# Patient Record
Sex: Female | Born: 1990 | Race: Black or African American | Hispanic: No | Marital: Single | Smoking: Former smoker
Health system: Southern US, Community
[De-identification: ages and names within clinical notes are randomized; demographics above are authoritative.]

## PROBLEM LIST (undated history)

## (undated) DIAGNOSIS — B9689 Other specified bacterial agents as the cause of diseases classified elsewhere: Secondary | ICD-10-CM

## (undated) DIAGNOSIS — I10 Essential (primary) hypertension: Secondary | ICD-10-CM

## (undated) DIAGNOSIS — N760 Acute vaginitis: Secondary | ICD-10-CM

## (undated) DIAGNOSIS — N809 Endometriosis, unspecified: Secondary | ICD-10-CM

---
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
## 2009-10-03 ENCOUNTER — Ambulatory Visit (HOSPITAL_COMMUNITY): Admission: RE | Admit: 2009-10-03 | Discharge: 2009-10-03 | Payer: Self-pay | Admitting: Obstetrics & Gynecology

## 2009-10-21 ENCOUNTER — Encounter: Payer: Self-pay | Admitting: Obstetrics

## 2009-10-21 ENCOUNTER — Inpatient Hospital Stay (HOSPITAL_COMMUNITY): Admission: AD | Admit: 2009-10-21 | Discharge: 2009-10-24 | Payer: Self-pay | Admitting: Obstetrics & Gynecology

## 2011-03-24 LAB — CBC
HCT: 35 % — ABNORMAL LOW (ref 36.0–46.0)
Hemoglobin: 12 g/dL (ref 12.0–15.0)
Hemoglobin: 9.7 g/dL — ABNORMAL LOW (ref 12.0–15.0)
MCHC: 34.4 g/dL (ref 30.0–36.0)
MCHC: 34.7 g/dL (ref 30.0–36.0)
MCV: 93.5 fL (ref 78.0–100.0)
Platelets: 147 10*3/uL — ABNORMAL LOW (ref 150–400)
RBC: 3.78 MIL/uL — ABNORMAL LOW (ref 3.87–5.11)
RDW: 12.6 % (ref 11.5–15.5)

## 2011-03-24 LAB — RPR: RPR Ser Ql: NONREACTIVE

## 2011-09-08 ENCOUNTER — Emergency Department (HOSPITAL_COMMUNITY)
Admission: EM | Admit: 2011-09-08 | Discharge: 2011-09-08 | Disposition: A | Discharge status: Home / Self Care | Payer: Medicaid Other | Attending: Emergency Medicine | Admitting: Emergency Medicine

## 2011-09-08 DIAGNOSIS — R51 Headache: Secondary | ICD-10-CM | POA: Insufficient documentation

## 2011-09-08 DIAGNOSIS — J020 Streptococcal pharyngitis: Secondary | ICD-10-CM | POA: Insufficient documentation

## 2011-11-21 ENCOUNTER — Emergency Department (HOSPITAL_COMMUNITY)
Admission: EM | Admit: 2011-11-21 | Discharge: 2011-11-21 | Disposition: A | Discharge status: Home / Self Care | Payer: Medicaid Other | Source: Home / Self Care | Attending: Family Medicine | Admitting: Family Medicine

## 2011-11-21 DIAGNOSIS — K439 Ventral hernia without obstruction or gangrene: Secondary | ICD-10-CM

## 2011-11-21 NOTE — ED Provider Notes (Signed)
History     CSN: [id number] Arrival date & time: 11/21/2011  6:03 PM   First MD Initiated Contact with Patient 11/21/11 1703      Chief Complaint  Patient presents with  . Abdominal Pain    Pt has small, painful lesion on RLQ that she decribes as "coming and going" and can feel when standing    (Consider location/radiation/quality/duration/timing/severity/associated sxs/prior treatment) HPI Comments: Amy Snyder presents for evaluation of a painful "bump or knot" on her RIGHT lower abdomen that she states comes and goes over the last 3 weeks. She first noticed it 3 weeks ago after doing some abdominal crunches. It then went away, but she also had not been doing crunches. She resumed her abdominal workout 2 days ago and noticed the return of the knot. She denies any pain with bowel movements. She denies any urinary symptoms. She is tolerating PO well without nausea or vomiting.   Patient is a 20 y.o. female presenting with abdominal pain. The history is provided by the patient.  Abdominal Pain The primary symptoms of the illness include abdominal pain. The primary symptoms of the illness do not include nausea, vomiting or diarrhea. The current episode started yesterday. The problem has not changed since onset. The patient states that she believes she is currently not pregnant. The patient has not had a change in bowel habit. Symptoms associated with the illness do not include constipation.    History reviewed. No pertinent past medical history.  Past Surgical History  Procedure Date  . Cesarean section     History reviewed. No pertinent family history.  History  Substance Use Topics  . Smoking status: Current Everyday Smoker -- 0.5 packs/day    Types: Cigarettes  . Smokeless tobacco: Not on file  . Alcohol Use: Yes    OB History    Grav Para Term Preterm Abortions TAB SAB Ect Mult Living                  Review of Systems  Constitutional: Negative.   HENT: Negative.     Eyes: Negative.   Respiratory: Negative.   Cardiovascular: Negative.   Gastrointestinal: Positive for abdominal pain. Negative for nausea, vomiting, diarrhea and constipation.  Genitourinary: Negative.   Musculoskeletal: Negative.   Skin: Negative.   Neurological: Negative.     Allergies  Review of patient's allergies indicates no known allergies.  Home Medications  No current outpatient prescriptions on file.  BP 141/94  Pulse 64  Temp(Src) 99.3 F (37.4 C) (Oral)  Resp 17  SpO2 100%  LMP 11/17/2011  Physical Exam  Nursing note and vitals reviewed. Constitutional: She is oriented to person, place, and time. She appears well-developed and well-nourished.  HENT:  Head: Normocephalic and atraumatic.  Eyes: EOM are normal.  Neck: Normal range of motion.  Pulmonary/Chest: Effort normal.  Abdominal: Soft. Normal appearance and bowel sounds are normal. She exhibits mass. She exhibits no pulsatile midline mass. There is tenderness in the right lower quadrant. There is no rebound, no guarding and no tenderness at McBurney's point.    Musculoskeletal: Normal range of motion.  Neurological: She is alert and oriented to person, place, and time.  Skin: Skin is warm and dry.  Psychiatric: Her behavior is normal.    ED Course  Procedures (including critical care time)  Labs Reviewed - No data to display No results found.   1. Hernia of abdominal wall       MDM  Referred for abdominal imaging to  evaluate for abdominal hernia        Richardo Priest, MD 11/25/11 1635

## 2011-12-02 ENCOUNTER — Ambulatory Visit (INDEPENDENT_AMBULATORY_CARE_PROVIDER_SITE_OTHER): Payer: Medicaid Other | Admitting: Surgery

## 2011-12-02 ENCOUNTER — Encounter (INDEPENDENT_AMBULATORY_CARE_PROVIDER_SITE_OTHER): Payer: Self-pay | Admitting: Surgery

## 2011-12-02 DIAGNOSIS — R1031 Right lower quadrant pain: Secondary | ICD-10-CM

## 2011-12-02 NOTE — Progress Notes (Signed)
Subjective:     [name], female   DOB: [birth year], 20 y.o.   MRN: [id number]  HPI  Amy Snyder  1991-05-27 [id number]  Patient Care Team: Richardo Priest, MD as Referring Physician (Family Medicine) Roseanna Rainbow, MD as Referring Physician (Obstetrics and Gynecology)  This patient is a 20 y.o.female who presents today for surgical evaluation at the request of Dr. Tamela Oddi.   Reason for visit: Right lower quadrant abdominal and groin pain. Question of hernia.  Patient is a pleasant active female who had a C-section 2 years ago. She recovered from that well. No wound infections or problems. Patient is rather physically active. She noticed after doing some sit-ups she started having some pull in her right rectus muscle and down to her groin. She thought she felt a lump. The pain was rather intense. She saw her gynecologist. And there was concern of a possible hernia, therefore, she was sent to me for evaluation.  The patient notes that the soreness is going down. She can feel a lump. However it is not getting bigger, maybe smaller. It does not change with activity or cough. No nausea or vomiting. No fevers chills or sweats. No diarrhea. No falls. No trauma. No sick contacts. No travel history. She's never had anything like this before.  Patient Active Problem List  Diagnoses  . Abdominal pain, RLQ (right lower quadrant), prob muscle strain    History reviewed. No pertinent past medical history.  Past Surgical History  Procedure Date  . Cesarean section Oct 21, 2009    History   Social History  . Marital Status: Single    Spouse Name: N/A    Number of Children: N/A  . Years of Education: N/A   Occupational History  . Not on file.   Social History Main Topics  . Smoking status: Current Everyday Smoker    Types: Cigarettes  . Smokeless tobacco: Not on file   Comment: smokes socially  . Alcohol Use: Yes  . Drug Use: No  . Sexually Active: Yes   Birth Control/ Protection: Injection   Other Topics Concern  . Not on file   Social History Narrative  . No narrative on file    History reviewed. No pertinent family history.  No current outpatient prescriptions on file.  No Known Allergies  BP 128/88  Pulse 68  Temp(Src) 97.8 F (36.6 C) (Temporal)  Resp 16  Ht 5\' 7"  (1.702 m)  Wt 146 lb (66.225 kg)  BMI 22.87 kg/m2  LMP 11/17/2011     Review of Systems  Constitutional: Negative for fever, chills, diaphoresis, appetite change and fatigue.  HENT: Negative for ear pain, sore throat, trouble swallowing, neck pain and ear discharge.   Eyes: Negative for photophobia, discharge and visual disturbance.  Respiratory: Negative for cough, choking, chest tightness and shortness of breath.   Cardiovascular: Negative for chest pain and palpitations.  Gastrointestinal: Positive for abdominal distention. Negative for nausea, vomiting, abdominal pain, diarrhea, constipation, blood in stool, anal bleeding and rectal pain.  Genitourinary: Negative for dysuria, urgency, frequency, decreased urine volume, vaginal bleeding, vaginal discharge and difficulty urinating.  Musculoskeletal: Negative for myalgias and gait problem.  Skin: Negative for color change, pallor, rash and wound.  Neurological: Negative for dizziness, speech difficulty, weakness and numbness.  Hematological: Negative for adenopathy.  Psychiatric/Behavioral: Negative for confusion and agitation. The patient is not nervous/anxious.        Objective:   Physical Exam  Constitutional: She is oriented to  person, place, and time. She appears well-developed and well-nourished. No distress.  HENT:  Head: Normocephalic.  Mouth/Throat: Oropharynx is clear and moist. No oropharyngeal exudate.  Eyes: Conjunctivae and EOM are normal. Pupils are equal, round, and reactive to light. No scleral icterus.  Neck: Normal range of motion. Neck supple. No tracheal deviation present.    Cardiovascular: Normal rate, regular rhythm and intact distal pulses.   Pulmonary/Chest: Effort normal and breath sounds normal. No respiratory distress. She has no wheezes. She has no rales. She exhibits no tenderness.  Abdominal: Soft. She exhibits no distension and no mass. There is no rebound and no guarding. Hernia confirmed negative in the right inguinal area and confirmed negative in the left inguinal area.       RLQ parmedian TTP / nodularity mild  RLQ Suprainguinal nodularity ? Enlarged groin LN?  Not c/w hernia by location.  No change w Valsalva  Genitourinary: No vaginal discharge found.  Musculoskeletal: Normal range of motion. She exhibits no edema and no tenderness.  Lymphadenopathy:    She has no cervical adenopathy.       Right: No inguinal adenopathy present.       Left: No inguinal adenopathy present.  Neurological: She is alert and oriented to person, place, and time. No cranial nerve deficit. She exhibits normal muscle tone. Coordination normal.  Skin: Skin is warm and dry. No rash noted. She is not diaphoretic. No erythema.  Psychiatric: She has a normal mood and affect. Her behavior is normal. Judgment and thought content normal.       Assessment:     Tenderness/nodularity most c/w hematoma / strain.  Doubt hernia at this point given less pain/symptoms    Plan:     At this point, I would recommend an anti-inflammatory regimen including Aleve 2 twice a day, heat 6 times a day, decreased physical activity.  I suspect her nodularity and soreness will gradually fade away over the next few weeks/months.  It is a hernia it will only worsen. If that is the case, then consider diagnostic laparoscopy with possible ventral hernia repair:  The anatomy & physiology of the abdominal wall was discussed.  The pathophysiology of hernias was discussed.  Natural history risks without surgery of enlargement, pain, incarceration & strangulation was discussed.   Contributors to  complications such as smoking, obesity, diabetes, prior surgery, etc were discussed.  I feel the risks of no intervention will lead to serious problems that outweigh the operative risks; therefore, I recommended surgery to reduce and repair the hernia.  I explained laparoscopic techniques with possible need for an open approach.  I noted the probable use of mesh to patch and/or buttress hernia repair  Risks such as bleeding, infection, abscess, need for further treatment, heart attack, death, and other risks were discussed.  I noted a good likelihood this will help address the problem. Goals of post-operative recovery were discussed as well.  Possibility that this will not correct all symptoms was explained.  I stressed the importance of low-impact activity, aggressive pain control, avoiding constipation, & not pushing through pain to minimize risk of post-operative chronic pain or injury. Possibility of reherniation was discussed.  We will work to minimize complications.   An educational handout further explaining the pathology & treatment options was given as well.  Questions were answered.  The patient expresses understanding.  Another thing to consider is a CT scan of the abdomen and pelvis to rule out some other etiology. However, I do not  think a completely normal scan would rule out a subtle hernia that can only be seen with diagnostic laparoscopy.  She will use the recommendations of anti-inflammatories first and call me if anything does not improve or if she worsens

## 2011-12-02 NOTE — Patient Instructions (Signed)
Muscle Strain A muscle strain, or pulled muscle, occurs when a muscle is over-stretched. A small number of muscle fibers may also be torn. This is especially common in athletes. This happens when a sudden violent force placed on a muscle pushes it past its capacity. Usually, recovery from a pulled muscle takes 1 to 2 weeks. But complete healing will take 5 to 6 weeks. There are millions of muscle fibers. Following injury, your body will usually return to normal quickly. HOME CARE INSTRUCTIONS   While awake, apply ice to the sore muscle for 15 to 20 minutes each hour for the first 2 days. Put ice in a plastic bag and place a towel between the bag of ice and your skin.   Do not use the pulled muscle for several days. Do not use the muscle if you have pain.   You may wrap the injured area with an elastic bandage for comfort. Be careful not to bind it too tightly. This may interfere with blood circulation.   Only take over-the-counter or prescription medicines for pain, discomfort, or fever as directed by your caregiver. Do not use aspirin as this will increase bleeding (bruising) at injury site.   Warming up before exercise helps prevent muscle strains.  SEEK MEDICAL CARE IF:  There is increased pain or swelling in the affected area. MAKE SURE YOU:   Understand these instructions.   Will watch your condition.   Will get help right away if you are not doing well or get worse.  Document Released: 12/06/2005 Document Revised: 08/18/2011 Document Reviewed: 07/05/2007 [facility] Patient Information 2012 [place], [place].   Managing Pain  Pain after surgery or related to activity is often due to strain/injury to muscle, tendon, nerves and/or incisions.  This pain is usually short-term and will improve in a few months.   Many people find it helpful to do the following things TOGETHER to help speed the process of healing and to get back to regular activity more quickly:  1. Avoid heavy physical  activity a.  no lifting greater than 20 pounds b. Do not "push through" the pain.  Listen to your body and avoid positions and maneuvers than reproduce the pain c. Walking is okay as tolerated, but go slowly and stop when getting sore.  d. Remember: If it hurts to do it, then don't do it! 2. Take Anti-inflammatory medication  a. Take with food/snack around the clock for 1-2 weeks i. This helps the muscle and nerve tissues become less irritable and calm down faster b. Choose ONE of the following over-the-counter medications: i. Naproxen 220mg  tabs (ex. Aleve) 1-2 pills twice a day  ii. Ibuprofen 200mg  tabs (ex. Advil, Motrin) 3-4 pills with every meal and just before bedtime iii. Acetaminophen 500mg  tabs (Tylenol) 1-2 pills with every meal and just before bedtime 3. Use a Heating pad or Ice/Cold Pack a. 4-6 times a day b. May use warm bath/hottub  or showers 4. Try Gentle Massage and/or Stretching  a. at the area of pain many times a day b. stop if you feel pain - do not overdo it  Try these steps together to help you body heal faster and avoid making things get worse.  Doing just one of these things may not be enough.    If you are not getting better after two weeks or are noticing you are getting worse, contact our office for further advice; we may need to re-evaluate you & see what other things we can do  to help.

## 2011-12-30 ENCOUNTER — Emergency Department (HOSPITAL_COMMUNITY)
Admission: EM | Admit: 2011-12-30 | Discharge: 2011-12-30 | Disposition: A | Discharge status: Home / Self Care | Payer: Medicaid Other | Attending: Emergency Medicine | Admitting: Emergency Medicine

## 2011-12-30 ENCOUNTER — Encounter (HOSPITAL_COMMUNITY): Payer: Self-pay

## 2011-12-30 DIAGNOSIS — R05 Cough: Secondary | ICD-10-CM | POA: Insufficient documentation

## 2011-12-30 DIAGNOSIS — R599 Enlarged lymph nodes, unspecified: Secondary | ICD-10-CM | POA: Insufficient documentation

## 2011-12-30 DIAGNOSIS — J069 Acute upper respiratory infection, unspecified: Secondary | ICD-10-CM

## 2011-12-30 DIAGNOSIS — R112 Nausea with vomiting, unspecified: Secondary | ICD-10-CM | POA: Insufficient documentation

## 2011-12-30 DIAGNOSIS — R51 Headache: Secondary | ICD-10-CM | POA: Insufficient documentation

## 2011-12-30 DIAGNOSIS — J3489 Other specified disorders of nose and nasal sinuses: Secondary | ICD-10-CM | POA: Insufficient documentation

## 2011-12-30 DIAGNOSIS — R059 Cough, unspecified: Secondary | ICD-10-CM | POA: Insufficient documentation

## 2011-12-30 DIAGNOSIS — R61 Generalized hyperhidrosis: Secondary | ICD-10-CM | POA: Insufficient documentation

## 2011-12-30 MED ORDER — ALBUTEROL SULFATE HFA 108 (90 BASE) MCG/ACT IN AERS
2.0000 | INHALATION_SPRAY | Freq: Once | RESPIRATORY_TRACT | Status: AC
  Administered 2011-12-30: 2 via RESPIRATORY_TRACT
  Filled 2011-12-30: qty 6.7

## 2011-12-30 MED ORDER — HYDROCOD POLST-CHLORPHEN POLST 10-8 MG/5ML PO LQCR: 5.0000 mL | Freq: Every evening | ORAL | Status: DC | PRN

## 2011-12-30 MED ORDER — ONDANSETRON 4 MG PO TBDP
ORAL_TABLET | ORAL | Status: AC
  Administered 2011-12-30: 8 mg
  Filled 2011-12-30: qty 2

## 2011-12-30 MED ORDER — IBUPROFEN 200 MG PO TABS
ORAL_TABLET | ORAL | Status: AC
  Administered 2011-12-30: 400 mg
  Filled 2011-12-30: qty 2

## 2011-12-30 MED ORDER — IBUPROFEN 800 MG PO TABS: 800.0000 mg | ORAL_TABLET | Freq: Three times a day (TID) | ORAL | Status: AC

## 2011-12-30 NOTE — ED Notes (Signed)
Pt states that since yesterday she has been having a headache that feels like pressure in her head beating the left side of her head. She states that her vision is ok but she felt slightly dizzy and her eyes felt heavy at times. She states that she has also been having a deep cough that is causing some chest wall pain. Alert and oriented. vicodin last night and tylenol today with little relief. Alert and oriented.

## 2011-12-30 NOTE — ED Provider Notes (Signed)
History     CSN: [id number]  Arrival date & time 12/30/11  1518   First MD Initiated Contact with Patient 12/30/11 1838      Chief Complaint  Patient presents with  . URI  . Cough    (Consider location/radiation/quality/duration/timing/severity/associated sxs/prior treatment) HPI History provided by pt.   Pt has had non-productive cough, nasal congestion, rhinorrhea, left frontal headache and sweats x 2 days.  Had nausea and single episode of vomiting this am. No known fever and denies sore throat, SOB, abdominal pain, diarrhea, body aches, weakness, fatigue.  Known sick contacts.  Pt smokes cigarettes but is otherwise healthy.   History reviewed. No pertinent past medical history.  Past Surgical History  Procedure Date  . Cesarean section Oct 21, 2009    History reviewed. No pertinent family history.  History  Substance Use Topics  . Smoking status: Current Everyday Smoker    Types: Cigarettes  . Smokeless tobacco: Not on file   Comment: smokes socially  . Alcohol Use: Yes    OB History    Grav Para Term Preterm Abortions TAB SAB Ect Mult Living                  Review of Systems  All other systems reviewed and are negative.    Allergies  Review of patient's allergies indicates no known allergies.  Home Medications   Current Outpatient Rx  Name Route Sig Dispense Refill  . ACETAMINOPHEN 325 MG PO TABS Oral Take 650 mg by mouth every 6 (six) hours as needed. For pain/fever    . HYDROCOD POLST-CPM POLST ER 10-8 MG/5ML PO LQCR Oral Take 5 mLs by mouth at bedtime as needed. 60 mL 0  . IBUPROFEN 800 MG PO TABS Oral Take 1 tablet (800 mg total) by mouth 3 (three) times daily. 12 tablet 0    BP 127/87  Pulse 81  Temp(Src) 99.1 F (37.3 C) (Oral)  Resp 18  SpO2 100%  Physical Exam  Nursing note and vitals reviewed. Constitutional: She is oriented to person, place, and time. She appears well-developed and well-nourished. No distress.  HENT:  Head: No  trismus in the jaw.  Right Ear: Tympanic membrane, external ear and ear canal normal.  Left Ear: Tympanic membrane, external ear and ear canal normal.  Mouth/Throat: Uvula is midline and mucous membranes are normal. No oropharyngeal exudate, posterior oropharyngeal edema or posterior oropharyngeal erythema.  Eyes:       Normal appearance  Neck: Normal range of motion. Neck supple.  Cardiovascular: Normal rate and regular rhythm.   Pulmonary/Chest: Effort normal and breath sounds normal. No respiratory distress.  Abdominal: Soft. Bowel sounds are normal. She exhibits no distension. There is no tenderness.  Lymphadenopathy:    She has cervical adenopathy.  Neurological: She is alert and oriented to person, place, and time.  Skin: Skin is warm and dry. No rash noted.  Psychiatric: She has a normal mood and affect. Her behavior is normal.    ED Course  Procedures (including critical care time)  Labs Reviewed - No data to display No results found.   1. Viral URI       MDM  Healthy 21yo F presents w/ c/o URI sx since yesterday.  Known sick contacts.  No respiratory distress and VS w/in nml limits on exam.  Based on duration of sx and exam, pt likely has a viral URI.  Headache probably secondary to sinus congestion.  Received an albuterol inhaler and d/c'd  home w/ tussionex and ibuprofen.  Recommended sudafed, rest, fluids as well.  She has a PCP to f/u with.        Arie Sabina [place], [place] 12/30/11 1941

## 2012-01-02 NOTE — ED Provider Notes (Signed)
Medical screening examination/treatment/procedure(s) were performed by non-physician practitioner and as supervising physician I was immediately available for consultation/collaboration.    Shelda Jakes, MD 01/02/12 [phone]

## 2012-11-27 ENCOUNTER — Telehealth (INDEPENDENT_AMBULATORY_CARE_PROVIDER_SITE_OTHER): Payer: Self-pay

## 2012-11-27 DIAGNOSIS — R109 Unspecified abdominal pain: Secondary | ICD-10-CM

## 2012-11-27 NOTE — Telephone Encounter (Signed)
Patient called in stating she was seen a year ago and was told if pain came back to call us to be evaluated for a poss hernia. I read office note and it said he may want to schedule a ct but that he is not sure it would show a hernia anyway. I scheduled her for first available long term follow up spot for January 6. Just FYI incase CT needs to be ordered before visit.

## 2012-11-28 NOTE — Telephone Encounter (Signed)
Please obtain CT scan of abd/pelvis with PO/IV contrast to r/o hernia other etiology for her pain

## 2012-11-29 NOTE — Addendum Note (Signed)
Addended byLiliana Cline on: 11/29/2012 09:54 AM   Modules accepted: Orders

## 2012-11-29 NOTE — Telephone Encounter (Signed)
Left message on machine for patient to call back and ask for me.   

## 2012-12-04 ENCOUNTER — Telehealth (INDEPENDENT_AMBULATORY_CARE_PROVIDER_SITE_OTHER): Payer: Self-pay

## 2012-12-04 NOTE — Telephone Encounter (Signed)
Called pt to let her know that I scheduled her CT with GI at [address] on Fri 12/20 at 10:15am

## 2012-12-08 ENCOUNTER — Ambulatory Visit
Admission: RE | Admit: 2012-12-08 | Discharge: 2012-12-08 | Disposition: A | Discharge status: Home / Self Care | Payer: Medicaid Other | Source: Ambulatory Visit | Attending: Surgery | Admitting: Surgery

## 2012-12-08 DIAGNOSIS — R109 Unspecified abdominal pain: Secondary | ICD-10-CM

## 2012-12-08 MED ORDER — IOHEXOL 300 MG/ML  SOLN
100.0000 mL | Freq: Once | INTRAMUSCULAR | Status: AC | PRN
  Administered 2012-12-08: 100 mL via INTRAVENOUS

## 2012-12-15 ENCOUNTER — Telehealth (INDEPENDENT_AMBULATORY_CARE_PROVIDER_SITE_OTHER): Payer: Self-pay | Admitting: General Surgery

## 2012-12-15 NOTE — Telephone Encounter (Signed)
Message copied by Liliana Cline on Fri Dec 15, 2012  9:03 AM ------      Message from: Ardeth Sportsman      Created: Fri Dec 08, 2012  2:08 PM       Tell pt the good news!  No evidence of hernia, bowel obstruction, constipation, mass.  No tumor/lipoma.  No enlarged lymph nodes.  Encourage Aleve 2 BID & Heat QID & <20 lb lifting for 2 weeks & see if the MS-like Sx go away.  If not, consider GI consult/referral

## 2012-12-15 NOTE — Telephone Encounter (Signed)
Patient called and made aware of CT results. She will try aleve/heat/no lifting and if no better she will contact a GI MD. She wanted her appt with DR Gross cancelled.

## 2012-12-25 ENCOUNTER — Ambulatory Visit (INDEPENDENT_AMBULATORY_CARE_PROVIDER_SITE_OTHER): Payer: Self-pay | Admitting: Surgery

## 2013-05-21 ENCOUNTER — Ambulatory Visit (INDEPENDENT_AMBULATORY_CARE_PROVIDER_SITE_OTHER): Payer: Medicaid Other | Admitting: Obstetrics

## 2013-05-21 ENCOUNTER — Encounter: Payer: Self-pay | Admitting: Obstetrics

## 2013-05-21 VITALS — BP 141/90 | HR 73 | Temp 99.1°F | Ht 67.0 in | Wt 156.0 lb

## 2013-05-21 DIAGNOSIS — Z113 Encounter for screening for infections with a predominantly sexual mode of transmission: Secondary | ICD-10-CM

## 2013-05-21 NOTE — Progress Notes (Signed)
Subjective:    Amy Snyder is a 22 y.o. female who presents for sexually transmitted disease check. Sexual history reviewed with the patient. STI Exposure: pt was treated for Chlamydia in December 2013. Pt here today for TOC. Previous history of STI GC, chlamydia, trichomonas. Current symptoms abnormal bleeding, vaginal odor. Contraception: abstinence Menstrual History: OB History   Grav Para Term Preterm Abortions TAB SAB Ect Mult Living                  Menarche age: 52  No LMP recorded.    The following portions of the patient's history were reviewed and updated as appropriate: allergies, current medications, past family history, past medical history, past social history, past surgical history and problem list.  Review of Systems Pertinent items are noted in HPI.    Objective:    BP 141/90  Pulse 73  Temp(Src) 99.1 F (37.3 C)  Ht 5\' 7"  (1.702 m)  Wt 156 lb (70.761 kg)  BMI 24.43 kg/m2 General:   alert and no distress  Lymph Nodes:   Cervical, supraclavicular, and axillary nodes normal. and NT.  Pelvis:  Vulva and vagina appear normal. Bimanual exam reveals normal uterus and adnexa.  Cultures:  GC and Chlamydia genprobes     Assessment:    Possible STD exposure    Plan:    Discussed safe sexual practice in detail Appropriate educational material was distributed See orders for STD cultures and assays Will call pt with results RTC PRN

## 2013-05-22 LAB — GC/CHLAMYDIA PROBE AMP: CT Probe RNA: NEGATIVE

## 2013-05-23 ENCOUNTER — Encounter: Payer: Self-pay | Admitting: Obstetrics

## 2013-06-27 ENCOUNTER — Encounter: Payer: Self-pay | Admitting: Obstetrics

## 2013-07-26 ENCOUNTER — Ambulatory Visit: Payer: Medicaid Other | Admitting: Obstetrics

## 2013-08-02 ENCOUNTER — Encounter: Payer: Self-pay | Admitting: Obstetrics

## 2013-08-02 ENCOUNTER — Ambulatory Visit (INDEPENDENT_AMBULATORY_CARE_PROVIDER_SITE_OTHER): Payer: Medicaid Other | Admitting: Obstetrics

## 2013-08-02 DIAGNOSIS — N920 Excessive and frequent menstruation with regular cycle: Secondary | ICD-10-CM | POA: Insufficient documentation

## 2013-08-02 DIAGNOSIS — Z113 Encounter for screening for infections with a predominantly sexual mode of transmission: Secondary | ICD-10-CM

## 2013-08-02 DIAGNOSIS — N939 Abnormal uterine and vaginal bleeding, unspecified: Secondary | ICD-10-CM | POA: Insufficient documentation

## 2013-08-02 DIAGNOSIS — N898 Other specified noninflammatory disorders of vagina: Secondary | ICD-10-CM

## 2013-08-02 MED ORDER — MEDROXYPROGESTERONE ACETATE 10 MG PO TABS: 10.0000 mg | ORAL_TABLET | Freq: Every day | ORAL | Status: DC

## 2013-08-02 NOTE — Progress Notes (Signed)
Subjective:     Amy Snyder is a 22 y.o. female here for problem visit.  Current complaints: abnormal vaginal bleeding, and "yellowish" vaginal discharge.  Personal health questionnaire reviewed: not asked.   Gynecologic History No LMP recorded. Contraception: abstinence  Obstetric History OB History  Gravida Para Term Preterm AB SAB TAB Ectopic Multiple Living  1 1  1      1     # Outcome Date GA Lbr Len/2nd Weight Sex Delivery Anes PTL Lv  1 PRE 10/21/09 [redacted]w[redacted]d  2 lb 4 oz (1.021 kg) M CS EPI  Y     Comments: [facility]OG       The following portions of the patient's history were reviewed and updated as appropriate: allergies, current medications, past family history, past medical history, past social history, past surgical history and problem list.  Review of Systems Pertinent items are noted in HPI.    Objective:    General appearance: alert and no distress Abdomen: normal findings: soft, non-tender Pelvic: cervix normal in appearance, external genitalia normal, no adnexal masses or tenderness, no cervical motion tenderness, rectovaginal septum normal, uterus normal size, shape, and consistency and vagina with thick white discharge.    Assessment:    AUB. Menorrhagia.   Plan:    Education reviewed: safe sex/STD prevention and AUB off Depo Provera.  Provera x 14 days     Patient does not want any contraception, except condoms.

## 2013-08-03 LAB — RPR

## 2013-08-03 LAB — HIV ANTIBODY (ROUTINE TESTING W REFLEX): HIV: NONREACTIVE

## 2013-08-03 LAB — WET PREP BY MOLECULAR PROBE: Candida species: NEGATIVE

## 2013-08-03 LAB — HEPATITIS C ANTIBODY: HCV Ab: NEGATIVE

## 2013-08-14 ENCOUNTER — Telehealth: Payer: Self-pay | Admitting: *Deleted

## 2013-08-14 NOTE — Telephone Encounter (Signed)
Patient called requesting lab results. LM on VM to CB.

## 2013-08-23 ENCOUNTER — Encounter: Payer: Self-pay | Admitting: Obstetrics

## 2013-10-30 ENCOUNTER — Encounter (HOSPITAL_COMMUNITY): Payer: Self-pay | Admitting: Emergency Medicine

## 2013-10-30 ENCOUNTER — Emergency Department (HOSPITAL_COMMUNITY)
Admission: EM | Admit: 2013-10-30 | Discharge: 2013-10-30 | Discharge status: Against Medical Advice | Payer: Medicaid Other | Attending: Emergency Medicine | Admitting: Emergency Medicine

## 2013-10-30 DIAGNOSIS — R109 Unspecified abdominal pain: Secondary | ICD-10-CM | POA: Insufficient documentation

## 2013-10-30 DIAGNOSIS — I10 Essential (primary) hypertension: Secondary | ICD-10-CM | POA: Insufficient documentation

## 2013-10-30 HISTORY — DX: Essential (primary) hypertension: I10

## 2013-10-30 NOTE — ED Notes (Signed)
Pt sts intermittent "knot" in lower abd x years that has been evaluated for hernia in past for; pt sts noticed again recently and having some pain

## 2013-10-30 NOTE — ED Notes (Signed)
Called x2 for room 

## 2013-10-30 NOTE — ED Notes (Signed)
Pt called x1 for room placement with no answer 

## 2013-10-31 ENCOUNTER — Emergency Department (HOSPITAL_COMMUNITY)
Admission: EM | Admit: 2013-10-31 | Discharge: 2013-10-31 | Discharge status: Against Medical Advice | Payer: Medicaid Other | Attending: Emergency Medicine | Admitting: Emergency Medicine

## 2013-10-31 ENCOUNTER — Encounter (HOSPITAL_COMMUNITY): Payer: Self-pay | Admitting: Emergency Medicine

## 2013-10-31 DIAGNOSIS — Z3202 Encounter for pregnancy test, result negative: Secondary | ICD-10-CM | POA: Insufficient documentation

## 2013-10-31 DIAGNOSIS — F17200 Nicotine dependence, unspecified, uncomplicated: Secondary | ICD-10-CM | POA: Insufficient documentation

## 2013-10-31 DIAGNOSIS — R1031 Right lower quadrant pain: Secondary | ICD-10-CM | POA: Insufficient documentation

## 2013-10-31 DIAGNOSIS — I10 Essential (primary) hypertension: Secondary | ICD-10-CM | POA: Insufficient documentation

## 2013-10-31 LAB — URINALYSIS, ROUTINE W REFLEX MICROSCOPIC
Bilirubin Urine: NEGATIVE
Glucose, UA: NEGATIVE mg/dL
Protein, ur: NEGATIVE mg/dL
Urobilinogen, UA: 1 mg/dL (ref 0.0–1.0)

## 2013-10-31 LAB — CBC WITH DIFFERENTIAL/PLATELET
Basophils Relative: 1 % (ref 0–1)
Eosinophils Absolute: 0.4 10*3/uL (ref 0.0–0.7)
Lymphocytes Relative: 47 % — ABNORMAL HIGH (ref 12–46)
MCH: 31.6 pg (ref 26.0–34.0)
MCHC: 36.2 g/dL — ABNORMAL HIGH (ref 30.0–36.0)
MCV: 87.2 fL (ref 78.0–100.0)
Monocytes Absolute: 0.5 10*3/uL (ref 0.1–1.0)
Neutro Abs: 3.4 10*3/uL (ref 1.7–7.7)
Neutrophils Relative %: 42 % — ABNORMAL LOW (ref 43–77)
Platelets: 188 10*3/uL (ref 150–400)

## 2013-10-31 LAB — URINE MICROSCOPIC-ADD ON

## 2013-10-31 LAB — COMPREHENSIVE METABOLIC PANEL
AST: 20 U/L (ref 0–37)
BUN: 8 mg/dL (ref 6–23)
GFR calc Af Amer: >90 mL/min (ref >90)
Glucose, Bld: 118 mg/dL — ABNORMAL HIGH (ref 70–99)
Sodium: 137 mEq/L (ref 135–145)
Total Bilirubin: 0.3 mg/dL (ref 0.3–1.2)

## 2013-10-31 NOTE — ED Notes (Signed)
No answer, unable to find, not in w/r.

## 2013-10-31 NOTE — ED Notes (Signed)
Pt. reports intermittent RLQ pain for 2 years , denies  nausea /vomitting or diarrhea , no urinary discomfort or vaginal discharge.

## 2013-10-31 NOTE — ED Notes (Signed)
No answer, unable to find, not in w/r.  

## 2013-11-01 LAB — URINE CULTURE: Culture: NO GROWTH

## 2013-11-06 ENCOUNTER — Emergency Department (HOSPITAL_COMMUNITY)
Admission: EM | Admit: 2013-11-06 | Discharge: 2013-11-06 | Disposition: A | Discharge status: Home / Self Care | Payer: Medicaid Other | Attending: Emergency Medicine | Admitting: Emergency Medicine

## 2013-11-06 ENCOUNTER — Encounter (HOSPITAL_COMMUNITY): Payer: Self-pay | Admitting: Emergency Medicine

## 2013-11-06 DIAGNOSIS — R109 Unspecified abdominal pain: Secondary | ICD-10-CM | POA: Insufficient documentation

## 2013-11-06 DIAGNOSIS — K458 Other specified abdominal hernia without obstruction or gangrene: Secondary | ICD-10-CM | POA: Insufficient documentation

## 2013-11-06 DIAGNOSIS — F17200 Nicotine dependence, unspecified, uncomplicated: Secondary | ICD-10-CM | POA: Insufficient documentation

## 2013-11-06 DIAGNOSIS — I10 Essential (primary) hypertension: Secondary | ICD-10-CM | POA: Insufficient documentation

## 2013-11-06 DIAGNOSIS — K439 Ventral hernia without obstruction or gangrene: Secondary | ICD-10-CM

## 2013-11-06 NOTE — ED Notes (Signed)
Pt c/o knot in lower abd off and on x 2 years; painful to touch; no nausea or vomiting

## 2013-11-06 NOTE — ED Provider Notes (Signed)
CSN: [id number]     Arrival date & time 11/06/13  2027 History  This chart was scribed for non-physician practitioner working with Gwyneth Sprout, MD by Ashley Jacobs, ED scribe. This patient was seen in room WTR8/WTR8 and the patient's care was started at 9:54 PM.   First MD Initiated Contact with Patient 11/06/13 2147     Chief Complaint  Patient presents with  . Cyst   (Consider location/radiation/quality/duration/timing/severity/associated sxs/prior Treatment) The history is provided by the patient.   HPI Comments: Kaysee Hergert is a 22 y.o. female who presents to the Emergency Department complaining of a painful, persistent cyst on her lower abdomen for the past two years. The cyst is painful to the touch. She visited her PCP for the same cyst two years ago and visited a Careers adviser (who concluded the cyst is non problematic). Pt denies nausea, vomiting and fever. She does not have any known allergies and a medical hx of HTN.  Pt currently smoke 0.2 packs of cigarettes a day and drinks alcohol socially.  Past Medical History  Diagnosis Date  . Hypertension    Past Surgical History  Procedure Laterality Date  . Cesarean section  Oct 21, 2009   Family History  Problem Relation Age of Onset  . Hypertension Mother   . Diabetes     History  Substance Use Topics  . Smoking status: Current Every Day Smoker -- 0.20 packs/day for 3 years    Types: Cigarettes  . Smokeless tobacco: Never Used     Comment: smokes socially  . Alcohol Use: Yes     Comment: socially   OB History   Grav Para Term Preterm Abortions TAB SAB Ect Mult Living   1 1  1      1      Review of Systems  Gastrointestinal: Positive for abdominal pain.  Skin: Negative for color change, rash and wound.    Allergies  Review of patient's allergies indicates no known allergies.  Home Medications  No current outpatient prescriptions on file. BP 147/92  Pulse 74  Temp(Src) 98.9 F (37.2 C) (Oral)  Resp 14   SpO2 100% Physical Exam  Nursing note and vitals reviewed. Constitutional: She appears well-developed and well-nourished.  Neck: Normal range of motion.  Cardiovascular: Normal rate.   Abdominal: Soft. Bowel sounds are normal. She exhibits no distension. There is no tenderness.    Musculoskeletal: Normal range of motion.  Neurological: She is alert.    ED Course  Procedures (including critical care time) DIAGNOSTIC STUDIES: Oxygen Saturation is 100% on room air, normal by my interpretation.    COORDINATION OF CARE: 9:58 PM Discussed course of care with pt . Pt understands and agrees.  Labs Review Labs Reviewed - No data to display Imaging Review No results found.  EKG Interpretation   None       MDM   1. Hernia of abdominal wall     Patient in no acute distress it took he several minutes and several position to find the nodule    I personally performed the services described in this documentation, which was scribed in my presence. The recorded information has been reviewed and is accurate.  Arman Filter, NP 11/07/13 [phone]

## 2013-11-06 NOTE — ED Notes (Signed)
Pt needs O2 sats and hr.  Unable to obtain due to nails

## 2013-11-06 NOTE — ED Notes (Signed)
Pt has a knot in her lower abdomen that she has had for two years, unsure of what it is. Has had a CT scan in the past to rule out anything acute.

## 2013-11-07 NOTE — ED Provider Notes (Signed)
Medical screening examination/treatment/procedure(s) were performed by non-physician practitioner and as supervising physician I was immediately available for consultation/collaboration.  EKG Interpretation   None         Gwyneth Sprout, MD 11/07/13 1558

## 2013-11-19 ENCOUNTER — Ambulatory Visit (INDEPENDENT_AMBULATORY_CARE_PROVIDER_SITE_OTHER): Payer: Medicaid Other | Admitting: Obstetrics

## 2013-11-19 ENCOUNTER — Encounter: Payer: Self-pay | Admitting: Obstetrics

## 2013-11-19 VITALS — BP 146/95 | HR 73 | Temp 98.6°F | Wt 162.0 lb

## 2013-11-19 DIAGNOSIS — Z3009 Encounter for other general counseling and advice on contraception: Secondary | ICD-10-CM

## 2013-11-19 DIAGNOSIS — Z0000 Encounter for general adult medical examination without abnormal findings: Secondary | ICD-10-CM

## 2013-11-19 DIAGNOSIS — K439 Ventral hernia without obstruction or gangrene: Secondary | ICD-10-CM

## 2013-11-19 NOTE — Progress Notes (Signed)
Subjective:     Amy Snyder is a 22 y.o. female here for a problem visit exam.  Current complaints: abnormal bleeding. Pt states that she has had bleeding approx every 2 weeks since last depo injection.  Last injection was Feb 2014.  Pt is also concerned that she may have a yeast/bacteria infection.  Pt would also like to have her annual pap exam at today's visit if possible.  Pt was also seen at [facility] 2 weeks ago for right side mass.  She was advised that it could be a hernia and that she should f/u with physician Personal health questionnaire reviewed: yes.   Gynecologic History Patient's last menstrual period was 11/13/2013. Contraception: Depo Provera Last Pap: Nov 2013. Results were: abnormal-pt was advised to f/u Last mammogram: n/a. Results were: n/a  Obstetric History OB History  Gravida Para Term Preterm AB SAB TAB Ectopic Multiple Living  1 1  1      1     # Outcome Date GA Lbr Len/2nd Weight Sex Delivery Anes PTL Lv  1 PRE 10/21/09 [redacted]w[redacted]d  2 lb 4 oz (1.021 kg) M CS EPI  Y     Comments: [facility]       The following portions of the patient's history were reviewed and updated as appropriate: allergies, current medications, past family history, past medical history, past social history, past surgical history and problem list.  Review of Systems Pertinent items are noted in HPI.    Objective:    General appearance: alert and no distress Breasts: normal appearance, no masses or tenderness Abdomen: normal findings: soft, non-tender.  ? Right ventral hernia. Pelvic: cervix normal in appearance, external genitalia normal, no adnexal masses or tenderness, no cervical motion tenderness, rectovaginal septum normal, uterus normal size, shape, and consistency and vagina normal without discharge    Assessment:    Healthy female exam.   Probable ventral hernia     Plan:    Education reviewed: safe sex/STD prevention. Contraception: condoms and stopping Depo Provera. Follow up  in: 1 year. Referred to General Surgery.

## 2013-11-19 NOTE — Addendum Note (Signed)
Addended by: Elby Beck F on: 11/19/2013 05:00 PM   Modules accepted: Orders

## 2013-11-20 LAB — PAP IG W/ RFLX HPV ASCU

## 2013-11-20 LAB — WET PREP BY MOLECULAR PROBE
Gardnerella vaginalis: NEGATIVE
Trichomonas vaginosis: NEGATIVE

## 2013-11-20 LAB — GC/CHLAMYDIA PROBE AMP: GC Probe RNA: NEGATIVE

## 2013-11-23 ENCOUNTER — Telehealth: Payer: Self-pay | Admitting: *Deleted

## 2013-11-23 NOTE — Telephone Encounter (Signed)
Pt given lab results from 11/19/13. Pt advised to call office to schedule colposcopy for abnormal pap.

## 2013-11-27 ENCOUNTER — Encounter (INDEPENDENT_AMBULATORY_CARE_PROVIDER_SITE_OTHER): Payer: Self-pay | Admitting: Surgery

## 2013-11-27 ENCOUNTER — Ambulatory Visit (INDEPENDENT_AMBULATORY_CARE_PROVIDER_SITE_OTHER): Payer: Medicaid Other | Admitting: Surgery

## 2013-11-27 VITALS — BP 118/78 | HR 80 | Temp 98.1°F | Resp 16 | Ht 67.0 in | Wt 166.0 lb

## 2013-11-27 DIAGNOSIS — D1739 Benign lipomatous neoplasm of skin and subcutaneous tissue of other sites: Secondary | ICD-10-CM

## 2013-11-27 DIAGNOSIS — D171 Benign lipomatous neoplasm of skin and subcutaneous tissue of trunk: Secondary | ICD-10-CM | POA: Insufficient documentation

## 2013-11-27 NOTE — Progress Notes (Signed)
[name], female   DOB: 01/03/1991, 22 y.o.   MRN: [id number]  Chief Complaint  Patient presents with  . Hernia    HPI Amy Snyder is a 22 y.o. female.  Patient sent at request of Dr. Coral Ceo or right lower quadrant abdominal pain and mass concerning for hernia. Was seen in this practice in 2013 for same problem. CT scan revealed no abnormality at that time. She has mild to moderate discomfort and a lump located in her right lower quadrant abdominal wall region. The areas got larger he thinks over one year. Discomfort when she pushes on the area. No change in bowel or bladder function. It  seems to be less noticeable if she is lying on her back. HPI  History reviewed. No pertinent past medical history.  Past Surgical History  Procedure Laterality Date  . Cesarean section  Oct 21, 2009    Family History  Problem Relation Age of Onset  . Hypertension Mother   . Diabetes      Social History History  Substance Use Topics  . Smoking status: Current Every Day Smoker -- 0.20 packs/day for 3 years    Types: Cigarettes  . Smokeless tobacco: Never Used     Comment: smokes socially  . Alcohol Use: Yes     Comment: socially    No Known Allergies  No current outpatient prescriptions on file.   No current facility-administered medications for this visit.    Review of Systems Review of Systems  Constitutional: Negative for fever, chills and unexpected weight change.  HENT: Negative for congestion, hearing loss, sore throat, trouble swallowing and voice change.   Eyes: Negative for visual disturbance.  Respiratory: Negative for cough and wheezing.   Cardiovascular: Negative for chest pain, palpitations and leg swelling.  Gastrointestinal: Negative for nausea, vomiting, abdominal pain, diarrhea, constipation, blood in stool, abdominal distention and anal bleeding.  Genitourinary: Negative for hematuria, vaginal bleeding and difficulty urinating.    Musculoskeletal: Negative for arthralgias.  Skin: Negative for rash and wound.  Neurological: Negative for seizures, syncope and headaches.  Hematological: Negative for adenopathy. Does not bruise/bleed easily.  Psychiatric/Behavioral: Negative for confusion.    Blood pressure 118/78, pulse 80, temperature 98.1 F (36.7 C), temperature source Temporal, resp. rate 16, height 5\' 7"  (1.702 m), weight 166 lb (75.297 kg), last menstrual period 11/13/2013.  Physical Exam Physical Exam  Constitutional: She is oriented to person, place, and time. She appears well-developed and well-nourished.  HENT:  Head: Normocephalic and atraumatic.  Eyes: Pupils are equal, round, and reactive to light. No scleral icterus.  Neck: Normal range of motion. Neck supple.  Cardiovascular: Normal rate and regular rhythm.   Pulmonary/Chest: Effort normal and breath sounds normal.  Abdominal: Soft. There is no tenderness. No hernia. Hernia confirmed negative in the right inguinal area and confirmed negative in the left inguinal area.    Musculoskeletal: Normal range of motion.  Neurological: She is alert and oriented to person, place, and time.  Skin: Skin is warm and dry.  Psychiatric: She has a normal mood and affect. Her behavior is normal. Judgment and thought content normal.    Data Reviewed  Clinical Data: Abdominal pain, evaluate for hernia  CT ABDOMEN AND PELVIS WITH CONTRAST  Technique: Multidetector CT imaging of the abdomen and pelvis was  performed following the standard protocol during bolus  administration of intravenous contrast.  Contrast: OMNIPAQUE IOHEXOL 300 MG/ML SOLN  Comparison: None.  Findings: The lung bases are  clear. The liver enhances with no  suspicious abnormality and no ductal dilatation is seen. A  probable tiny cyst is noted near the dome of the right lobe  posteriorly and laterally. No calcified gallstones are seen. The  pancreas is normal in size and the pancreatic  duct is not dilated.  The adrenal glands and spleen are unremarkable. The stomach is  moderately fluid distended with no abnormality noted. The kidneys  enhance with no calculus or mass and there is no evidence of  hydronephrosis. The abdominal aorta is normal in caliber.  The uterus is slightly to the left of midline. The urinary bladder  is unremarkable. There are probable ovarian follicles bilaterally.  No free fluid is seen within the pelvis. No abnormality of the  colon is seen. The appendix fills with contrast and air and is  unremarkable, and the terminal ileum appears normal. No bony  abnormality is seen.  No abdominal wall hernia is noted.  IMPRESSION:  1. No abdominal wall hernia.  2. No explanation for the patient's pain is seen.  Original Report Authenticated By: Dwyane Dee, M.D.  Assessment    Right lower quadrant abdominal wall lipoma 3 cm x 3 cm    Plan    Discussed excision of the lipoma with the patient. This was noted on physical exam and can be seen on CT scan from last year even though it is not reported. No evidence of hernia. Patient will think about having this removed he'll let me know. She thinks it's gotten larger.       Katharin Schneider A. 11/27/2013, 10:41 AM

## 2013-11-27 NOTE — Patient Instructions (Signed)

## 2013-11-28 ENCOUNTER — Encounter: Payer: Self-pay | Admitting: Obstetrics

## 2013-12-25 ENCOUNTER — Encounter: Payer: Self-pay | Admitting: *Deleted

## 2013-12-25 ENCOUNTER — Encounter: Payer: Medicaid Other | Admitting: Obstetrics

## 2013-12-31 ENCOUNTER — Encounter: Payer: Medicaid Other | Admitting: Obstetrics

## 2014-01-05 ENCOUNTER — Encounter (HOSPITAL_COMMUNITY): Payer: Self-pay | Admitting: *Deleted

## 2014-01-05 ENCOUNTER — Inpatient Hospital Stay (HOSPITAL_COMMUNITY)
Admission: AD | Admit: 2014-01-05 | Discharge: 2014-01-05 | Disposition: A | Discharge status: Home / Self Care | Payer: Medicaid Other | Source: Ambulatory Visit | Attending: Obstetrics | Admitting: Obstetrics

## 2014-01-05 DIAGNOSIS — B9689 Other specified bacterial agents as the cause of diseases classified elsewhere: Secondary | ICD-10-CM | POA: Insufficient documentation

## 2014-01-05 DIAGNOSIS — L293 Anogenital pruritus, unspecified: Secondary | ICD-10-CM | POA: Insufficient documentation

## 2014-01-05 DIAGNOSIS — I10 Essential (primary) hypertension: Secondary | ICD-10-CM | POA: Insufficient documentation

## 2014-01-05 DIAGNOSIS — N760 Acute vaginitis: Secondary | ICD-10-CM | POA: Insufficient documentation

## 2014-01-05 DIAGNOSIS — A499 Bacterial infection, unspecified: Secondary | ICD-10-CM | POA: Insufficient documentation

## 2014-01-05 LAB — WET PREP, GENITAL
Trich, Wet Prep: NONE SEEN
Yeast Wet Prep HPF POC: NONE SEEN

## 2014-01-05 LAB — POCT PREGNANCY, URINE: PREG TEST UR: NEGATIVE

## 2014-01-05 MED ORDER — METRONIDAZOLE 500 MG PO TABS: 500.0000 mg | ORAL_TABLET | Freq: Two times a day (BID) | ORAL | Status: DC

## 2014-01-05 MED ORDER — HYDROCHLOROTHIAZIDE 25 MG PO TABS: 25.0000 mg | ORAL_TABLET | Freq: Every day | ORAL | Status: DC

## 2014-01-05 NOTE — Discharge Instructions (Signed)
Hypertension As your heart beats, it forces blood through your arteries. This force is your blood pressure. If the pressure is too high, it is called hypertension (HTN) or high blood pressure. HTN is dangerous because you may have it and not know it. High blood pressure may mean that your heart has to work harder to pump blood. Your arteries may be narrow or stiff. The extra work puts you at risk for heart disease, stroke, and other problems.  Blood pressure consists of two numbers, a higher number over a lower, 110/72, for example. It is stated as "110 over 72." The ideal is below 120 for the top number (systolic) and under 80 for the bottom (diastolic). Write down your blood pressure today. You should pay close attention to your blood pressure if you have certain conditions such as:  Heart failure.  Prior heart attack.  Diabetes  Chronic kidney disease.  Prior stroke.  Multiple risk factors for heart disease. To see if you have HTN, your blood pressure should be measured while you are seated with your arm held at the level of the heart. It should be measured at least twice. A one-time elevated blood pressure reading (especially in the Emergency Department) does not mean that you need treatment. There may be conditions in which the blood pressure is different between your right and left arms. It is important to see your caregiver soon for a recheck. Most people have essential hypertension which means that there is not a specific cause. This type of high blood pressure may be lowered by changing lifestyle factors such as:  Stress.  Smoking.  Lack of exercise.  Excessive weight.  Drug/tobacco/alcohol use.  Eating less salt. Most people do not have symptoms from high blood pressure until it has caused damage to the body. Effective treatment can often prevent, delay or reduce that damage. TREATMENT  When a cause has been identified, treatment for high blood pressure is directed at the  cause. There are a large number of medications to treat HTN. These fall into several categories, and your caregiver will help you select the medicines that are best for you. Medications may have side effects. You should review side effects with your caregiver. If your blood pressure stays high after you have made lifestyle changes or started on medicines,   Your medication(s) may need to be changed.  Other problems may need to be addressed.  Be certain you understand your prescriptions, and know how and when to take your medicine.  Be sure to follow up with your caregiver within the time frame advised (usually within two weeks) to have your blood pressure rechecked and to review your medications.  If you are taking more than one medicine to lower your blood pressure, make sure you know how and at what times they should be taken. Taking two medicines at the same time can result in blood pressure that is too low. SEEK IMMEDIATE MEDICAL CARE IF:  You develop a severe headache, blurred or changing vision, or confusion.  You have unusual weakness or numbness, or a faint feeling.  You have severe chest or abdominal pain, vomiting, or breathing problems. MAKE SURE YOU:   Understand these instructions.  Will watch your condition.  Will get help right away if you are not doing well or get worse. Document Released: 12/06/2005 Document Revised: 02/28/2012 Document Reviewed: 07/26/2008 [facility] Patient Information 2014 [place]. Bacterial Vaginosis Bacterial vaginosis is a vaginal infection that occurs when the normal balance of bacteria  in the vagina is disrupted. It results from an overgrowth of certain bacteria. This is the most common vaginal infection in women of childbearing age. Treatment is important to prevent complications, especially in pregnant women, as it can cause a premature delivery. CAUSES  Bacterial vaginosis is caused by an increase in harmful bacteria that are normally  present in smaller amounts in the vagina. Several different kinds of bacteria can cause bacterial vaginosis. However, the reason that the condition develops is not fully understood. RISK FACTORS Certain activities or behaviors can put you at an increased risk of developing bacterial vaginosis, including:  Having a new sex partner or multiple sex partners.  Douching.  Using an intrauterine device (IUD) for contraception. Women do not get bacterial vaginosis from toilet seats, bedding, swimming pools, or contact with objects around them. SIGNS AND SYMPTOMS  Some women with bacterial vaginosis have no signs or symptoms. Common symptoms include:  Grey vaginal discharge.  A fishlike odor with discharge, especially after sexual intercourse.  Itching or burning of the vagina and vulva.  Burning or pain with urination. DIAGNOSIS  Your health care provider will take a medical history and examine the vagina for signs of bacterial vaginosis. A sample of vaginal fluid may be taken. Your health care provider will look at this sample under a microscope to check for bacteria and abnormal cells. A vaginal pH test may also be done.  TREATMENT  Bacterial vaginosis may be treated with antibiotic medicines. These may be given in the form of a pill or a vaginal cream. A second round of antibiotics may be prescribed if the condition comes back after treatment.  HOME CARE INSTRUCTIONS   Only take over-the-counter or prescription medicines as directed by your health care provider.  If antibiotic medicine was prescribed, take it as directed. Make sure you finish it even if you start to feel better.  Do not have sex until treatment is completed.  Tell all sexual partners that you have a vaginal infection. They should see their health care provider and be treated if they have problems, such as a mild rash or itching.  Practice safe sex by using condoms and only having one sex partner. SEEK MEDICAL CARE IF:     Your symptoms are not improving after 3 days of treatment.  You have increased discharge or pain.  You have a fever. MAKE SURE YOU:   Understand these instructions.  Will watch your condition.  Will get help right away if you are not doing well or get worse. FOR MORE INFORMATION  Centers for Disease Control and Prevention, Division of STD Prevention: [employer] American Sexual Health Association (ASHA): www.ashastd.org  Document Released: 12/06/2005 Document Revised: 09/26/2013 Document Reviewed: 07/18/2013 [facility] Patient Information 2014 [place].

## 2014-01-05 NOTE — MAU Provider Note (Signed)
  History     CSN: [id number]  Arrival date and time: 01/05/14 2671   First Provider Initiated Contact with Patient 01/05/14 1033      Chief Complaint  Patient presents with  . Vaginal Itching  . Possible Pregnancy   HPI Comments: Amy Snyder 23 y.o G1P0101 presents to MAU for vaginal itching. She had unprotected intercourse with her usual partner and then developed itch. She usually uses condoms for birth control. Her last depo Provera was in Feb 2014. She also has an elevated BP and states her BP has been elevated for over one year. She has been told to reduce salt intake which has not helped.    Vaginal Itching  Possible Pregnancy     History reviewed. No pertinent past medical history.  Past Surgical History  Procedure Laterality Date  . Cesarean section  Oct 21, 2009    Family History  Problem Relation Age of Onset  . Hypertension Mother   . Diabetes      History  Substance Use Topics  . Smoking status: Current Every Day Smoker -- 0.20 packs/day for 3 years    Types: Cigarettes  . Smokeless tobacco: Never Used     Comment: smokes socially  . Alcohol Use: Yes     Comment: socially    Allergies: No Known Allergies  No prescriptions prior to admission    Review of Systems  Constitutional: Negative.   HENT: Negative.   Eyes: Negative.   Respiratory: Negative.   Cardiovascular: Negative.   Gastrointestinal: Negative.   Genitourinary:       Vaginal itching  Musculoskeletal: Negative.   Skin: Negative.   Neurological: Negative.   Psychiatric/Behavioral: Negative.    Physical Exam   Blood pressure 156/105, pulse 68, temperature 98.2 F (36.8 C), resp. rate 18, last menstrual period 01/08/2013.  Physical Exam  Constitutional: She is oriented to person, place, and time. She appears well-developed and well-nourished. No distress.  HENT:  Head: Normocephalic and atraumatic.  Eyes: Pupils are equal, round, and reactive to light.  Cardiovascular:  Normal rate, regular rhythm and normal heart sounds.  Exam reveals no gallop.   No murmur heard. Respiratory: Effort normal and breath sounds normal. No respiratory distress. She has no wheezes. She has no rales. She exhibits no tenderness.  GI: Bowel sounds are normal. She exhibits no distension and no mass. There is no tenderness. There is no rebound and no guarding.  Genitourinary:  Genital: negative Vaginal:small amt white, nonodorous discharge Cervix:negative Bimanual:negative for CMT or mass  Musculoskeletal: Normal range of motion.  Neurological: She is alert and oriented to person, place, and time.  Skin: Skin is warm and dry.  Psychiatric: She has a normal mood and affect. Her behavior is normal. Judgment and thought content normal.   Results for orders placed during the hospital encounter of 01/05/14 (from the past 24 hour(s))  POCT PREGNANCY, URINE     Status: None   Collection Time    01/05/14 10:20 AM      Result Value Range   Preg Test, Ur NEGATIVE  NEGATIVE     MAU Course  Procedures  MDM  Wet prep, gc, chlamydia  Assessment and Plan   A: Hypertension Bacterial vaginosis  P: HCTZ 25 mg po qam Discuss this with Dr Jodi Mourning at visit next month Flagly 500 mg PO BID x 7 days Encouraged to see Dr Jodi Mourning for birth control F/U PRN  Georgia Duff 01/05/2014, 10:53 AM

## 2014-01-05 NOTE — MAU Note (Signed)
Pt presents with complaints of vaginal itching and states that she has had unprotected sex and noticed the itching a couple of days ago.

## 2014-01-07 ENCOUNTER — Telehealth (HOSPITAL_COMMUNITY): Payer: Self-pay

## 2014-01-07 LAB — GC/CHLAMYDIA PROBE AMP
CT PROBE, AMP APTIMA: NEGATIVE
GC Probe RNA: NEGATIVE

## 2014-01-07 NOTE — Telephone Encounter (Signed)
Patient called back and was notified that her GC/CL came back negative. 01/07/14

## 2014-01-10 ENCOUNTER — Other Ambulatory Visit: Payer: Self-pay | Admitting: *Deleted

## 2014-01-10 DIAGNOSIS — T3695XA Adverse effect of unspecified systemic antibiotic, initial encounter: Principal | ICD-10-CM

## 2014-01-10 DIAGNOSIS — B379 Candidiasis, unspecified: Secondary | ICD-10-CM

## 2014-01-10 MED ORDER — FLUCONAZOLE 150 MG PO TABS: 150.0000 mg | ORAL_TABLET | Freq: Once | ORAL | Status: DC

## 2014-01-10 NOTE — Progress Notes (Signed)
Patient currently taking Flagyl and is now having symptoms of a yeast infection. Per nursing protocol Diflucan sent to the pharmacy.

## 2014-01-15 ENCOUNTER — Ambulatory Visit: Payer: Medicaid Other | Admitting: Obstetrics

## 2014-01-21 ENCOUNTER — Ambulatory Visit (INDEPENDENT_AMBULATORY_CARE_PROVIDER_SITE_OTHER): Payer: Medicaid Other | Admitting: Obstetrics

## 2014-01-21 ENCOUNTER — Encounter: Payer: Self-pay | Admitting: Obstetrics

## 2014-01-21 ENCOUNTER — Other Ambulatory Visit: Payer: Self-pay | Admitting: Obstetrics

## 2014-01-21 VITALS — BP 161/102 | HR 79 | Temp 98.5°F | Wt 168.0 lb

## 2014-01-21 DIAGNOSIS — B373 Candidiasis of vulva and vagina: Secondary | ICD-10-CM

## 2014-01-21 DIAGNOSIS — T3695XA Adverse effect of unspecified systemic antibiotic, initial encounter: Secondary | ICD-10-CM

## 2014-01-21 DIAGNOSIS — R87612 Low grade squamous intraepithelial lesion on cytologic smear of cervix (LGSIL): Secondary | ICD-10-CM | POA: Insufficient documentation

## 2014-01-21 DIAGNOSIS — Z01812 Encounter for preprocedural laboratory examination: Secondary | ICD-10-CM

## 2014-01-21 DIAGNOSIS — B3731 Acute candidiasis of vulva and vagina: Secondary | ICD-10-CM | POA: Insufficient documentation

## 2014-01-21 DIAGNOSIS — B379 Candidiasis, unspecified: Secondary | ICD-10-CM | POA: Insufficient documentation

## 2014-01-21 DIAGNOSIS — R52 Pain, unspecified: Secondary | ICD-10-CM

## 2014-01-21 DIAGNOSIS — Z3202 Encounter for pregnancy test, result negative: Secondary | ICD-10-CM

## 2014-01-21 DIAGNOSIS — N760 Acute vaginitis: Secondary | ICD-10-CM | POA: Insufficient documentation

## 2014-01-21 DIAGNOSIS — B9689 Other specified bacterial agents as the cause of diseases classified elsewhere: Secondary | ICD-10-CM | POA: Insufficient documentation

## 2014-01-21 DIAGNOSIS — A499 Bacterial infection, unspecified: Secondary | ICD-10-CM

## 2014-01-21 LAB — POCT URINE PREGNANCY: Preg Test, Ur: NEGATIVE

## 2014-01-21 MED ORDER — IBUPROFEN 800 MG PO TABS: 800.0000 mg | ORAL_TABLET | Freq: Three times a day (TID) | ORAL | Status: DC | PRN

## 2014-01-21 MED ORDER — FLUCONAZOLE 150 MG PO TABS: 150.0000 mg | ORAL_TABLET | Freq: Once | ORAL | Status: DC

## 2014-01-21 MED ORDER — METRONIDAZOLE 500 MG PO TABS: 500.0000 mg | ORAL_TABLET | Freq: Two times a day (BID) | ORAL | Status: DC

## 2014-01-21 NOTE — Progress Notes (Signed)
Colposcopy Procedure Note  Indications: Pap smear 2 months ago showed: low-grade squamous intraepithelial neoplasia (LGSIL - encompassing HPV,mild dysplasia,CIN I). The prior pap showed low-grade squamous intraepithelial neoplasia (LGSIL - encompassing HPV,mild dysplasia,CIN I).  Prior cervical/vaginal disease: HPV related changes. Prior cervical treatment: no treatment.  Procedure Details  The risks and benefits of the procedure and Written informed consent obtained.  A time-out was performed confirming the patient, procedure and allergy status  Speculum placed in vagina and excellent visualization of cervix achieved, cervix swabbed x 3 with acetic acid solution.  Findings: Cervix: acetowhite lesion(s) noted at 7 and 2 o'clock; SCJ visualized 360 degrees without lesions, endocervical curettage performed, cervical biopsies taken at 7 and 2 o'clock, specimen labelled and sent to pathology, hemostasis achieved with Monsel's solution and hemostasis achieved with silver nitrate.   Vaginal inspection: normal without visible lesions. Vulvar colposcopy: vulvar colposcopy not performed.    Specimens: ECC and Cervical Biopsies  Complications: none.  Plan: Specimens labelled and sent to Pathology.

## 2014-01-22 LAB — WET PREP BY MOLECULAR PROBE
Candida species: NEGATIVE
Gardnerella vaginalis: NEGATIVE
Trichomonas vaginosis: NEGATIVE

## 2014-01-22 LAB — GC/CHLAMYDIA PROBE AMP
CT Probe RNA: NEGATIVE
GC Probe RNA: NEGATIVE

## 2014-02-05 ENCOUNTER — Ambulatory Visit: Payer: Medicaid Other | Admitting: Obstetrics

## 2014-02-06 ENCOUNTER — Ambulatory Visit (INDEPENDENT_AMBULATORY_CARE_PROVIDER_SITE_OTHER): Payer: Medicaid Other | Admitting: Obstetrics

## 2014-02-06 ENCOUNTER — Encounter: Payer: Self-pay | Admitting: Obstetrics

## 2014-02-06 VITALS — BP 153/92 | HR 73 | Temp 98.5°F | Ht 67.0 in | Wt 167.0 lb

## 2014-02-06 DIAGNOSIS — N871 Moderate cervical dysplasia: Secondary | ICD-10-CM

## 2014-02-06 NOTE — Progress Notes (Signed)
  Subjective:     Amy Snyder is a 23 y.o. female here for a routine exam.  Current complaints: Patient if office today for follow up of Colposcopy results. Patient denies any concerns.  Personal health questionnaire reviewed: yes.   Gynecologic History Patient's last menstrual period was 02/01/2014. Contraception: condoms Last Pap: 11/19/2013. Results were: abnormal  Obstetric History OB History  Gravida Para Term Preterm AB SAB TAB Ectopic Multiple Living  1 1  1      1     # Outcome Date GA Lbr Len/2nd Weight Sex Delivery Anes PTL Lv  1 PRE 10/21/09 [redacted]w[redacted]d  2 lb 4 oz (1.021 kg) M CS EPI  Y     Comments: [place]       The following portions of the patient's history were reviewed and updated as appropriate: allergies, current medications, past family history, past medical history, past social history, past surgical history and problem list.  Review of Systems Pertinent items are noted in HPI.    Objective:    No exam performed today, consult only.    Assessment:    HGSIL   Plan:    Education reviewed: Management of cervical dysplasia. Follow up in: 1 week.   Cryocautery of cervix planned.

## 2014-02-14 ENCOUNTER — Ambulatory Visit: Payer: Medicaid Other | Admitting: Obstetrics

## 2014-03-05 ENCOUNTER — Ambulatory Visit: Payer: Medicaid Other | Admitting: Obstetrics

## 2014-03-21 ENCOUNTER — Ambulatory Visit (INDEPENDENT_AMBULATORY_CARE_PROVIDER_SITE_OTHER): Payer: Medicaid Other | Admitting: Obstetrics

## 2014-03-21 ENCOUNTER — Encounter: Payer: Self-pay | Admitting: Obstetrics

## 2014-03-21 VITALS — BP 147/93 | HR 71 | Temp 98.5°F | Ht 67.0 in | Wt 166.0 lb

## 2014-03-21 DIAGNOSIS — N898 Other specified noninflammatory disorders of vagina: Secondary | ICD-10-CM

## 2014-03-21 DIAGNOSIS — N390 Urinary tract infection, site not specified: Secondary | ICD-10-CM

## 2014-03-21 LAB — POCT URINALYSIS DIPSTICK
BILIRUBIN UA: NEGATIVE
GLUCOSE UA: NEGATIVE
KETONES UA: NEGATIVE
NITRITE UA: POSITIVE
RBC UA: 250
Spec Grav, UA: 1.02
Urobilinogen, UA: NEGATIVE
pH, UA: 5

## 2014-03-21 LAB — POCT URINE PREGNANCY: Preg Test, Ur: NEGATIVE

## 2014-03-21 MED ORDER — NITROFURANTOIN MONOHYD MACRO 100 MG PO CAPS: 100.0000 mg | ORAL_CAPSULE | Freq: Two times a day (BID) | ORAL | Status: DC

## 2014-03-21 NOTE — Progress Notes (Signed)
Subjective:     Amy Snyder is a 23 y.o. female here for a routine exam.  Current complaints: Patient is in the office for a Cryo procedure, but in review she has had unprotected intercourse within the past week. Patient request UPT and check for infection..    Personal health questionnaire:  Is patient [name], have a family history of breast and/or ovarian cancer: no Is there a family history of uterine cancer diagnosed at age < 54, gastrointestinal cancer, urinary tract cancer, family member who is a Field seismologist syndrome-associated carrier: no Is the patient overweight and hypertensive, family history of diabetes, personal history of gestational diabetes or PCOS: yes Is patient over 40, have PCOS,  family history of premature CHD under age 51, diabetes, smoke, have hypertension or peripheral artery disease:  no  The HPI was reviewed and explored in further detail by the provider. Gynecologic History Patient's last menstrual period was 02/27/2014. Contraception: condoms Last Pap: 10/2014. Results were: abnormal  Obstetric History OB History  Gravida Para Term Preterm AB SAB TAB Ectopic Multiple Living  1 1  1      1     # Outcome Date GA Lbr Len/2nd Weight Sex Delivery Anes PTL Lv  1 PRE 10/21/09 [redacted]w[redacted]d  2 lb 4 oz (1.021 kg) M CS EPI  Y     Comments: [place]       The following portions of the patient's history were reviewed and updated as appropriate: allergies, current medications, past family history, past social history, past surgical history and problem list.  Review of Systems Pertinent items are noted in HPI.    Objective:    No exam performed today, Patient had unprotected intercourse.  Procedure cancelled and rescheduled.  100% of 5 min visit spent on counseling and coordination of care.  Nursing visit only.  Assessment:    HGSIL   Plan:    Cryocautery of cervix rescheduled.

## 2014-03-22 LAB — WET PREP BY MOLECULAR PROBE
Candida species: NEGATIVE
GARDNERELLA VAGINALIS: POSITIVE — AB
Trichomonas vaginosis: NEGATIVE

## 2014-03-22 LAB — GC/CHLAMYDIA PROBE AMP
CT PROBE, AMP APTIMA: NEGATIVE
GC Probe RNA: NEGATIVE

## 2014-03-24 LAB — URINE CULTURE: Colony Count: >=100000

## 2014-03-25 ENCOUNTER — Encounter: Payer: Self-pay | Admitting: Obstetrics

## 2014-03-28 ENCOUNTER — Other Ambulatory Visit: Payer: Self-pay | Admitting: *Deleted

## 2014-03-28 DIAGNOSIS — N760 Acute vaginitis: Principal | ICD-10-CM

## 2014-03-28 DIAGNOSIS — B9689 Other specified bacterial agents as the cause of diseases classified elsewhere: Secondary | ICD-10-CM

## 2014-03-28 MED ORDER — METRONIDAZOLE 500 MG PO TABS: 500.0000 mg | ORAL_TABLET | Freq: Two times a day (BID) | ORAL | Status: DC

## 2014-04-30 ENCOUNTER — Encounter: Payer: Medicaid Other | Admitting: Obstetrics

## 2014-05-22 ENCOUNTER — Ambulatory Visit (INDEPENDENT_AMBULATORY_CARE_PROVIDER_SITE_OTHER): Payer: Medicaid Other | Admitting: Obstetrics

## 2014-05-22 DIAGNOSIS — R6889 Other general symptoms and signs: Secondary | ICD-10-CM

## 2014-05-23 DIAGNOSIS — IMO0002 Reserved for concepts with insufficient information to code with codable children: Secondary | ICD-10-CM | POA: Insufficient documentation

## 2014-05-23 NOTE — Progress Notes (Signed)
Patient not seen by Physician. Procedure rescheduled.

## 2014-07-03 ENCOUNTER — Ambulatory Visit (INDEPENDENT_AMBULATORY_CARE_PROVIDER_SITE_OTHER): Payer: Medicaid Other | Admitting: Obstetrics

## 2014-07-03 VITALS — BP 166/117 | HR 69 | Temp 98.5°F | Wt 164.0 lb

## 2014-07-03 DIAGNOSIS — N871 Moderate cervical dysplasia: Secondary | ICD-10-CM

## 2014-07-03 DIAGNOSIS — I10 Essential (primary) hypertension: Secondary | ICD-10-CM | POA: Insufficient documentation

## 2014-07-03 DIAGNOSIS — Z3202 Encounter for pregnancy test, result negative: Secondary | ICD-10-CM

## 2014-07-03 MED ORDER — METRONIDAZOLE 500 MG PO TABS: 500.0000 mg | ORAL_TABLET | Freq: Two times a day (BID) | ORAL | Status: DC

## 2014-07-03 MED ORDER — DOXYCYCLINE HYCLATE 100 MG PO CAPS: 100.0000 mg | ORAL_CAPSULE | Freq: Two times a day (BID) | ORAL | Status: DC

## 2014-07-03 MED ORDER — CARVEDILOL 6.25 MG PO TABS: 6.2500 mg | ORAL_TABLET | Freq: Two times a day (BID) | ORAL | Status: DC

## 2014-07-03 MED ORDER — IBUPROFEN 800 MG PO TABS: 800.0000 mg | ORAL_TABLET | Freq: Three times a day (TID) | ORAL | Status: DC | PRN

## 2014-07-03 MED ORDER — TRIAMTERENE-HCTZ 37.5-25 MG PO CAPS: 1.0000 | ORAL_CAPSULE | Freq: Every day | ORAL | Status: DC

## 2014-07-04 LAB — POCT URINE PREGNANCY: Preg Test, Ur: NEGATIVE

## 2014-07-04 NOTE — Addendum Note (Signed)
Addended by: Ladona Ridgel on: 07/04/2014 10:28 AM   Modules accepted: Orders

## 2014-07-04 NOTE — Progress Notes (Signed)
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  KY jelly was applied to the cryoprobe.  The probe was then applied to the cervix until an ice ball had formed.  This ice ball was then allowed to remain in place a three min., thawed for 5 minutes, then frozen again for 3 minutes.  All instruments then retired. INSTRUCTIONS: The cryoprobe was then removed.  The patient was given a preprinted instruction list and is instructed to return to the office in 2 weeks for a follow up examination.

## 2014-07-05 LAB — WET PREP BY MOLECULAR PROBE
Candida species: NEGATIVE
Gardnerella vaginalis: POSITIVE — AB
Trichomonas vaginosis: NEGATIVE

## 2014-07-06 LAB — GC/CHLAMYDIA PROBE AMP
CT PROBE, AMP APTIMA: POSITIVE — AB
GC PROBE AMP APTIMA: NEGATIVE

## 2014-07-11 ENCOUNTER — Telehealth: Payer: Self-pay | Admitting: *Deleted

## 2014-07-11 NOTE — Telephone Encounter (Signed)
Patient called and states she has some irritation on the vulva. Patient had cryo last week and she thinks it is from wearing pads all the time. 8:22 Spoke with patient- advised warm soaks, air to area, OTC yeast cream to vulva. Advised moisture barrier after skin is better. Patient advised to call doctor on call if worse over the week end and my call on Monday for appointment if needs.

## 2014-07-17 ENCOUNTER — Encounter: Payer: Self-pay | Admitting: Obstetrics

## 2014-07-17 ENCOUNTER — Ambulatory Visit (INDEPENDENT_AMBULATORY_CARE_PROVIDER_SITE_OTHER): Payer: Medicaid Other | Admitting: Obstetrics

## 2014-07-17 VITALS — BP 123/85 | HR 72 | Temp 98.1°F | Ht 67.0 in | Wt 164.0 lb

## 2014-07-17 DIAGNOSIS — A749 Chlamydial infection, unspecified: Secondary | ICD-10-CM | POA: Insufficient documentation

## 2014-07-17 DIAGNOSIS — A5609 Other chlamydial infection of lower genitourinary tract: Secondary | ICD-10-CM | POA: Insufficient documentation

## 2014-07-17 DIAGNOSIS — N871 Moderate cervical dysplasia: Secondary | ICD-10-CM

## 2014-07-17 MED ORDER — AZITHROMYCIN 250 MG PO TABS: 1000.0000 mg | ORAL_TABLET | Freq: Once | ORAL | Status: DC

## 2014-07-17 NOTE — Progress Notes (Signed)
Subjective:     Damiya Sandefur is a 23 y.o. 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 Eating a regular diet without difficulty. Bowel movements are normal. The patient is not having any pain.  The following portions of the patient's history were reviewed and updated as appropriate: allergies, current medications, past family history, past medical history, past social history, past surgical history and problem list.  Review of Systems A comprehensive review of systems was negative.    Objective:    BP 123/85  Pulse 72  Temp(Src) 98.1 F (36.7 C)  Ht 5\' 7"  (1.702 m)  Wt 164 lb (74.39 kg)  BMI 25.68 kg/m2  LMP 07/14/2014          PE:        SSE:  Cervix open ( patient menstruating ).  Normal discharge and scab from cryo.   Assessment:     Doing well postoperatively. Operative findings again reviewed. Pathology report discussed.  Chlamydia cevicitis   Plan:    1. Continue any current medications. 2. Wound care discussed. 3. Activity restrictions: No sex or anything in the vagina for 6 weeks. 4. Anticipated return to work:  now 36. Follow up: 4 weeks for pap smear 6. Azithromycin Rx  For Chlamydia

## 2014-07-29 ENCOUNTER — Telehealth: Payer: Self-pay | Admitting: *Deleted

## 2014-07-29 NOTE — Telephone Encounter (Signed)
Patient reports she is having some pink discharge for 1 week.Is this normal? 3:09 LM on VM to CB

## 2014-07-30 ENCOUNTER — Telehealth: Payer: Self-pay | Admitting: *Deleted

## 2014-07-30 NOTE — Telephone Encounter (Signed)
Patient called with symptoms after Cryo. 5:15 Call to patient patient states she has a pinkish discharge for about a week now. No odor , pain ,or other color in discharge. Reassured normal cervical healing process. Advised no intercourse until doctor recommended time period is over. Patient to call back if she feels she needs appointment.

## 2014-08-12 ENCOUNTER — Encounter (HOSPITAL_COMMUNITY): Payer: Self-pay | Admitting: Emergency Medicine

## 2014-08-12 ENCOUNTER — Emergency Department (HOSPITAL_COMMUNITY)
Admission: EM | Admit: 2014-08-12 | Discharge: 2014-08-12 | Disposition: A | Discharge status: Home / Self Care | Payer: Medicaid Other | Attending: Emergency Medicine | Admitting: Emergency Medicine

## 2014-08-12 DIAGNOSIS — Z9889 Other specified postprocedural states: Secondary | ICD-10-CM | POA: Diagnosis not present

## 2014-08-12 DIAGNOSIS — D1739 Benign lipomatous neoplasm of skin and subcutaneous tissue of other sites: Secondary | ICD-10-CM | POA: Diagnosis not present

## 2014-08-12 DIAGNOSIS — Z79899 Other long term (current) drug therapy: Secondary | ICD-10-CM | POA: Insufficient documentation

## 2014-08-12 DIAGNOSIS — F17200 Nicotine dependence, unspecified, uncomplicated: Secondary | ICD-10-CM | POA: Insufficient documentation

## 2014-08-12 DIAGNOSIS — R109 Unspecified abdominal pain: Secondary | ICD-10-CM | POA: Insufficient documentation

## 2014-08-12 DIAGNOSIS — D171 Benign lipomatous neoplasm of skin and subcutaneous tissue of trunk: Secondary | ICD-10-CM

## 2014-08-12 MED ORDER — TRAMADOL HCL 50 MG PO TABS: 50.0000 mg | ORAL_TABLET | Freq: Four times a day (QID) | ORAL | Status: DC | PRN

## 2014-08-12 NOTE — Discharge Instructions (Signed)
Take tramadol as directed as needed for pain.  Lipoma A lipoma is a noncancerous (benign) tumor composed of fat cells. They are usually found under the skin (subcutaneous). A lipoma may occur in any tissue of the body that contains fat. Common areas for lipomas to appear include the back, shoulders, buttocks, and thighs. Lipomas are a very common soft tissue growth. They are soft and grow slowly. Most problems caused by a lipoma depend on where it is growing. DIAGNOSIS  A lipoma can be diagnosed with a physical exam. These tumors rarely become cancerous, but radiographic studies can help determine this for certain. Studies used may include:  Computerized X-ray scans (CT or CAT scan).  Computerized magnetic scans (MRI). TREATMENT  Small lipomas that are not causing problems may be watched. If a lipoma continues to enlarge or causes problems, removal is often the best treatment. Lipomas can also be removed to improve appearance. Surgery is done to remove the fatty cells and the surrounding capsule. Most often, this is done with medicine that numbs the area (local anesthetic). The removed tissue is examined under a microscope to make sure it is not cancerous. Keep all follow-up appointments with your caregiver. SEEK MEDICAL CARE IF:   The lipoma becomes larger or hard.  The lipoma becomes painful, red, or increasingly swollen. These could be signs of infection or a more serious condition. Document Released: 11/26/2002 Document Revised: 02/28/2012 Document Reviewed: 05/08/2010 [facility] Patient Information 2015 [place], [place]. This information is not intended to replace advice given to you by your health care provider. Make sure you discuss any questions you have with your health care provider.

## 2014-08-12 NOTE — ED Notes (Signed)
Pt c/o lower abd pain with possible lipoma x months that is more painful today; pt sts some pain in abd area

## 2014-08-12 NOTE — ED Provider Notes (Signed)
CSN: [id number]     Arrival date & time 08/12/14  1229 History   First MD Initiated Contact with Patient 08/12/14 1413     Chief Complaint  Patient presents with  . Abdominal Pain     (Consider location/radiation/quality/duration/timing/severity/associated sxs/prior Treatment) HPI Comments: Patient is a 23 year old female who presents to the emergency department complaining of worsening pain from an abdominal wall lipoma that has been present since December 2012. Patient reports initially the general surgeon that it was a hernia, however been diagnosed her with a lipoma. Over the past few years, she states the area has increased in size, she was reevaluated in December 2014, however there was no intervention at that time. States over the past few months the pain has increased and she believes she has another lipoma developing. Pain worse with certain movements such as going from a laying to a seated position. She tried taking ibuprofen with temporary relief of her symptoms. Denies nausea, vomiting, fever, chills, diarrhea or bowel changes.  Patient is a 22 y.o. female presenting with abdominal pain. The history is provided by the patient.  Abdominal Pain   History reviewed. No pertinent past medical history. Past Surgical History  Procedure Laterality Date  . Cesarean section  Oct 21, 2009   Family History  Problem Relation Age of Onset  . Hypertension Mother   . Diabetes     History  Substance Use Topics  . Smoking status: Current Some Day Smoker -- 0.20 packs/day for 3 years    Types: Cigarettes  . Smokeless tobacco: Never Used     Comment: smokes socially  . Alcohol Use: Yes     Comment: socially   OB History   Grav Para Term Preterm Abortions TAB SAB Ect Mult Living   1 1  1      1      Review of Systems  Skin:       + lump on lower abdomen.  All other systems reviewed and are negative.     Allergies  Review of patient's allergies indicates no known  allergies.  Home Medications   Prior to Admission medications   Medication Sig Start Date End Date Taking? Authorizing Provider  carvedilol (COREG) 6.25 MG tablet Take 1 tablet (6.25 mg total) by mouth 2 (two) times daily with a meal. 07/03/14  Yes Shelly Bombard, MD  triamterene-hydrochlorothiazide (DYAZIDE) 37.5-25 MG per capsule Take 1 each (1 capsule total) by mouth daily. 07/03/14  Yes Shelly Bombard, MD  traMADol (ULTRAM) 50 MG tablet Take 1 tablet (50 mg total) by mouth every 6 (six) hours as needed. 08/12/14   Illene Labrador, PA-C   BP 135/77  Pulse 72  Temp(Src) 98.3 F (36.8 C) (Oral)  Resp 20  SpO2 100%  LMP 07/14/2014 Physical Exam  Nursing note and vitals reviewed. Constitutional: She is oriented to person, place, and time. She appears well-developed and well-nourished. No distress.  HENT:  Head: Normocephalic and atraumatic.  Mouth/Throat: Oropharynx is clear and moist.  Eyes: Conjunctivae are normal.  Neck: Normal range of motion. Neck supple.  Cardiovascular: Normal rate, regular rhythm and normal heart sounds.   Pulmonary/Chest: Effort normal and breath sounds normal.  Abdominal: Soft. Bowel sounds are normal. There is no rigidity, no rebound and no guarding.    Musculoskeletal: Normal range of motion. She exhibits no edema.  Neurological: She is alert and oriented to person, place, and time.  Skin: Skin is warm and dry. She is not diaphoretic.  Psychiatric: She has a normal mood and affect. Her behavior is normal.    ED Course  Procedures (including critical care time) Labs Review Labs Reviewed - No data to display  Imaging Review No results found.   EKG Interpretation None      MDM   Final diagnoses:  Lipoma of abdominal wall   Patient presenting with worsening pain from previously diagnosed lipoma. She is nontoxic appearing and in no apparent distress. Afebrile, vital signs stable. 2 round, mobile, soft, tender subcutaneous masses palpable on  exam as stated in physical exam. No associated symptoms. Advised her to followup with [facility] [place] [facility] for further evaluation of the lipomas. Stable for discharge. Return precautions given. Patient states understanding of treatment care plan and is agreeable.  Illene Labrador, PA-C 08/12/14 1437

## 2014-08-12 NOTE — ED Notes (Signed)
Pt here for evaluation of lower abdominal pain. Now up for d.c

## 2014-08-12 NOTE — ED Provider Notes (Signed)
Medical screening examination/treatment/procedure(s) were performed by non-physician practitioner and as supervising physician I was immediately available for consultation/collaboration.   EKG Interpretation None        Sharyon Cable, MD 08/12/14 [phone]

## 2014-08-28 ENCOUNTER — Ambulatory Visit: Payer: Medicaid Other | Admitting: Obstetrics and Gynecology

## 2014-09-11 ENCOUNTER — Encounter: Payer: Self-pay | Admitting: Obstetrics

## 2014-09-11 ENCOUNTER — Ambulatory Visit (INDEPENDENT_AMBULATORY_CARE_PROVIDER_SITE_OTHER): Payer: Medicaid Other | Admitting: Obstetrics

## 2014-09-11 ENCOUNTER — Other Ambulatory Visit: Payer: Self-pay | Admitting: Obstetrics

## 2014-09-11 VITALS — BP 146/90 | HR 84 | Temp 97.5°F | Ht 67.0 in | Wt 164.0 lb

## 2014-09-11 DIAGNOSIS — I10 Essential (primary) hypertension: Secondary | ICD-10-CM

## 2014-09-11 DIAGNOSIS — A499 Bacterial infection, unspecified: Secondary | ICD-10-CM

## 2014-09-11 DIAGNOSIS — B3731 Acute candidiasis of vulva and vagina: Secondary | ICD-10-CM

## 2014-09-11 DIAGNOSIS — B373 Candidiasis of vulva and vagina: Secondary | ICD-10-CM

## 2014-09-11 DIAGNOSIS — B9689 Other specified bacterial agents as the cause of diseases classified elsewhere: Secondary | ICD-10-CM

## 2014-09-11 DIAGNOSIS — N760 Acute vaginitis: Secondary | ICD-10-CM

## 2014-09-11 MED ORDER — FLUCONAZOLE 150 MG PO TABS: 150.0000 mg | ORAL_TABLET | Freq: Once | ORAL | Status: DC

## 2014-09-11 MED ORDER — METRONIDAZOLE 500 MG PO TABS: 500.0000 mg | ORAL_TABLET | Freq: Two times a day (BID) | ORAL | Status: DC

## 2014-09-12 ENCOUNTER — Other Ambulatory Visit: Payer: Self-pay | Admitting: Obstetrics

## 2014-09-12 ENCOUNTER — Encounter: Payer: Self-pay | Admitting: Obstetrics

## 2014-09-12 LAB — WET PREP BY MOLECULAR PROBE
Candida species: POSITIVE — AB
GARDNERELLA VAGINALIS: NEGATIVE
Trichomonas vaginosis: NEGATIVE

## 2014-09-12 LAB — GC/CHLAMYDIA PROBE AMP
CT Probe RNA: NEGATIVE
GC Probe RNA: NEGATIVE

## 2014-09-12 NOTE — Progress Notes (Signed)
[name], female   DOB: 1991-03-10, 23 y.o.   MRN: [id number]  Chief Complaint  Patient presents with  . Vaginitis    Irritation and Itching. Denies burning. Increased discharge no odor.     HPI Amy Snyder is a 23 y.o. female.  Vaginal discharge and itching.  HPI  History reviewed. No pertinent past medical history.  Past Surgical History  Procedure Laterality Date  . Cesarean section  Oct 21, 2009    Family History  Problem Relation Age of Onset  . Hypertension Mother   . Diabetes      Social History History  Substance Use Topics  . Smoking status: Current Some Day Smoker -- 0.20 packs/day for 3 years    Types: Cigarettes  . Smokeless tobacco: Never Used     Comment: smokes socially  . Alcohol Use: Yes     Comment: socially    No Known Allergies  Current Outpatient Prescriptions  Medication Sig Dispense Refill  . carvedilol (COREG) 6.25 MG tablet Take 1 tablet (6.25 mg total) by mouth 2 (two) times daily with a meal.  60 tablet  11  . triamterene-hydrochlorothiazide (DYAZIDE) 37.5-25 MG per capsule Take 1 each (1 capsule total) by mouth daily.  30 capsule  11  . fluconazole (DIFLUCAN) 150 MG tablet Take 1 tablet (150 mg total) by mouth once.  1 tablet  2  . metroNIDAZOLE (FLAGYL) 500 MG tablet Take 1 tablet (500 mg total) by mouth 2 (two) times daily.  14 tablet  2   No current facility-administered medications for this visit.    Review of Systems Review of Systems Constitutional: negative for fatigue and weight loss Respiratory: negative for cough and wheezing Cardiovascular: negative for chest pain, fatigue and palpitations Gastrointestinal: negative for abdominal pain and change in bowel habits Genitourinary:negative Integument/breast: negative for nipple discharge Musculoskeletal:negative for myalgias Neurological: negative for gait problems and tremors Behavioral/Psych: negative for abusive relationship, depression Endocrine: negative  for temperature intolerance     Blood pressure 146/90, pulse 84, temperature 97.5 F (36.4 C), height 5\' 7"  (1.702 m), weight 164 lb (74.39 kg), last menstrual period 09/11/2014.  Physical Exam Physical Exam General:   alert  Skin:   no rash or abnormalities  Lungs:   clear to auscultation bilaterally  Heart:   regular rate and rhythm, S1, S2 normal, no murmur, click, rub or gallop  Breasts:   normal without suspicious masses, skin or nipple changes or axillary nodes  Abdomen:  normal findings: no organomegaly, soft, non-tender and no hernia  Pelvis:  External genitalia: normal general appearance Urinary system: urethral meatus normal and bladder without fullness, nontender Vaginal: normal without tenderness, induration or masses Cervix: normal appearance Adnexa: normal bimanual exam Uterus: anteverted and non-tender, normal size      Data Reviewed Labs  Assessment    Candida vulvovaginitis.     Plan   Diflucan Rx   Orders Placed This Encounter  Procedures  . WET PREP BY MOLECULAR PROBE  . GC/Chlamydia Probe Amp   Meds ordered this encounter  Medications  . fluconazole (DIFLUCAN) 150 MG tablet    Sig: Take 1 tablet (150 mg total) by mouth once.    Dispense:  1 tablet    Refill:  2  . metroNIDAZOLE (FLAGYL) 500 MG tablet    Sig: Take 1 tablet (500 mg total) by mouth 2 (two) times daily.    Dispense:  14 tablet    Refill:  2  HARPER,CHARLES A 09/12/2014, 5:55 AM

## 2014-10-21 ENCOUNTER — Encounter: Payer: Self-pay | Admitting: Obstetrics

## 2014-11-18 ENCOUNTER — Ambulatory Visit: Payer: Medicaid Other | Admitting: Obstetrics

## 2014-11-27 ENCOUNTER — Ambulatory Visit (INDEPENDENT_AMBULATORY_CARE_PROVIDER_SITE_OTHER): Payer: Medicaid Other | Admitting: *Deleted

## 2014-11-27 ENCOUNTER — Other Ambulatory Visit (HOSPITAL_COMMUNITY)
Admission: RE | Admit: 2014-11-27 | Discharge: 2014-11-27 | Disposition: A | Discharge status: Home / Self Care | Payer: Medicaid Other | Source: Ambulatory Visit | Attending: Family Medicine | Admitting: Family Medicine

## 2014-11-27 ENCOUNTER — Ambulatory Visit (INDEPENDENT_AMBULATORY_CARE_PROVIDER_SITE_OTHER): Payer: Medicaid Other | Admitting: Obstetrics and Gynecology

## 2014-11-27 ENCOUNTER — Encounter: Payer: Self-pay | Admitting: Obstetrics and Gynecology

## 2014-11-27 VITALS — BP 120/78 | HR 62 | Temp 97.8°F | Ht 67.5 in | Wt 166.0 lb

## 2014-11-27 DIAGNOSIS — Z113 Encounter for screening for infections with a predominantly sexual mode of transmission: Secondary | ICD-10-CM

## 2014-11-27 DIAGNOSIS — Z23 Encounter for immunization: Secondary | ICD-10-CM

## 2014-11-27 DIAGNOSIS — L739 Follicular disorder, unspecified: Secondary | ICD-10-CM

## 2014-11-27 DIAGNOSIS — Z114 Encounter for screening for human immunodeficiency virus [HIV]: Secondary | ICD-10-CM

## 2014-11-27 DIAGNOSIS — A499 Bacterial infection, unspecified: Secondary | ICD-10-CM

## 2014-11-27 DIAGNOSIS — Z202 Contact with and (suspected) exposure to infections with a predominantly sexual mode of transmission: Secondary | ICD-10-CM

## 2014-11-27 DIAGNOSIS — N760 Acute vaginitis: Secondary | ICD-10-CM | POA: Insufficient documentation

## 2014-11-27 DIAGNOSIS — I10 Essential (primary) hypertension: Secondary | ICD-10-CM

## 2014-11-27 DIAGNOSIS — N898 Other specified noninflammatory disorders of vagina: Secondary | ICD-10-CM

## 2014-11-27 DIAGNOSIS — D171 Benign lipomatous neoplasm of skin and subcutaneous tissue of trunk: Secondary | ICD-10-CM

## 2014-11-27 DIAGNOSIS — B9689 Other specified bacterial agents as the cause of diseases classified elsewhere: Secondary | ICD-10-CM

## 2014-11-27 LAB — POCT WET PREP (WET MOUNT): CLUE CELLS WET PREP WHIFF POC: POSITIVE

## 2014-11-27 NOTE — Progress Notes (Addendum)
     Subjective: Chief Complaint  Patient presents with  . New Evaluation    previous femina    HPI: Amy Snyder is a 23 y.o. presenting to clinic today to discuss the following:  #Elevated blood pressure - patient states to her previous clinic told her that she had elevated blood pressures. Told her that she will need to be on blood pressure medications. Her pressure today was not elevated. Patient was started on Coreg at previous clinic. She says she takes it infrequently. She denies any side effects.  # STD screening - patient wanted to know if she can have STD screening today. She does endorse that she gets BV a couple times a year and has to be treated. She has one partner that she has been with. She is not too worried about an infection but wants to be safe. She endorses using condoms for protection against STDs. She has had Chlamydia in the past and does not know is she had been exposed to other veneral diseases.   #Lipoma -patient has social coughing lipoma on her right side. Near her right groin. She has been seen by currently on her surgery. Noted for the redo surgery dictating need a referral from her primary care doctor. She says it has been there for 3 years and has grown in size. She says the pain comes and goes.   Health Maintenance: Flu shot needed.  Smoker-2-3/cig a day   All systems were reviewed and were negative unless otherwise noted in the HPI Past Medical, Surgical, Social, and Family History Reviewed & Updated per EMR.   Objective: BP 120/78 mmHg  Pulse 62  Temp(Src) 97.8 F (36.6 C) (Oral)  Ht 5' 7.5" (1.715 m)  Wt 166 lb (75.297 kg)  BMI 25.60 kg/m2  LMP 10/28/2014  Physical Exam  Constitutional: She is oriented to person, place, and time and well-developed, well-nourished, and in no distress.  HENT:  Head: Normocephalic and atraumatic.  Mouth/Throat: Oropharynx is clear and moist.  Eyes: Conjunctivae and EOM are normal. Pupils are equal, round,  and reactive to light.  Neck: Normal range of motion. Neck supple. No thyromegaly present.  Cardiovascular: Normal rate, regular rhythm, normal heart sounds and intact distal pulses.   No murmur heard. Pulmonary/Chest: Effort normal and breath sounds normal. She has no wheezes.  Abdominal: Soft. Bowel sounds are normal. She exhibits no distension. There is no tenderness.  Genitourinary: Vaginal discharge found.  Thin, white vaginal discharge. Inflamed circular indurated papule on mons pubis.  Musculoskeletal: Normal range of motion. She exhibits no edema.  Lymphadenopathy:    She has no cervical adenopathy.  Neurological: She is alert and oriented to person, place, and time. She has normal reflexes. No cranial nerve deficit. Gait normal.  Skin: Skin is warm and dry. No rash noted.  Psychiatric: Mood and affect normal.     Assessment/Plan: Please see problem based Assessment and Plan  Health Maintainance: Up to date. flu shot given today    Luiz Blare, DO 11/27/2014, 4:12 PM PGY-1, [place]

## 2014-11-27 NOTE — Patient Instructions (Signed)
Amy Snyder it was great to see you today!  I am pleased to hear that things are going well for you.   Here are some of the things we discussed today: -I will call you about the results of your STD screening -Continue to check blood pressures at home and record them. Return to clinic if you have more than 3 that are elevated.   Please schedule a follow-up appointment for as needed   Thanks for allowing me to be a part of your care! Dr. Gerarda Fraction

## 2014-11-28 DIAGNOSIS — L739 Follicular disorder, unspecified: Secondary | ICD-10-CM | POA: Insufficient documentation

## 2014-11-28 LAB — HIV ANTIBODY (ROUTINE TESTING W REFLEX): HIV 1&2 Ab, 4th Generation: NONREACTIVE

## 2014-11-28 LAB — RPR

## 2014-11-28 MED ORDER — METRONIDAZOLE 500 MG PO TABS: 500.0000 mg | ORAL_TABLET | Freq: Two times a day (BID) | ORAL | Status: DC

## 2014-11-28 NOTE — Assessment & Plan Note (Signed)
A: Discharge on exam. Wet prep ordered. +Whiff test and many clue cells. P: Rx sent for Flagyl. If patient continues to get BV will consider switching therapy to a course of clindamycin. Patient instructed on things that can cause recurrent BV.

## 2014-11-28 NOTE — Assessment & Plan Note (Addendum)
A: Bacterial(likely Staph) folliculitis due to hair shaving.  P: Patient educated on shaving against the direction of hair growth. Patient declined wanting area to be lanced. She is to use warm compresses and follow-up if area does not get better in one week. No need for antibiotics at this time. Conservative management.

## 2014-11-29 LAB — CERVICOVAGINAL ANCILLARY ONLY
Chlamydia: NEGATIVE
Neisseria Gonorrhea: NEGATIVE

## 2014-11-30 DIAGNOSIS — Z113 Encounter for screening for infections with a predominantly sexual mode of transmission: Secondary | ICD-10-CM | POA: Insufficient documentation

## 2014-11-30 NOTE — Assessment & Plan Note (Signed)
A: Stable. BP today 120/72. P: Continue Coreg.

## 2014-11-30 NOTE — Assessment & Plan Note (Signed)
A: Possible increase in size. Not incarcerated. P: Patient has been seen by [facility] before. Will place referral so she can follow-up with them for possible surgical intervention.

## 2014-11-30 NOTE — Assessment & Plan Note (Signed)
A: Patient with hx of chlamydia. P: Ordered full STD screening.

## 2014-12-03 ENCOUNTER — Telehealth: Payer: Self-pay | Admitting: Obstetrics and Gynecology

## 2014-12-03 NOTE — Telephone Encounter (Signed)
Pt can not be referred anywhere because she only has pregnancy medicaid. jw

## 2014-12-03 NOTE — Telephone Encounter (Signed)
Pt cant have referral to surgeon. Amy Snyder, [name]

## 2014-12-11 ENCOUNTER — Ambulatory Visit: Payer: Medicaid Other | Admitting: Obstetrics

## 2014-12-25 ENCOUNTER — Telehealth: Payer: Self-pay | Admitting: Obstetrics and Gynecology

## 2014-12-25 NOTE — Telephone Encounter (Signed)
Pt had flagyl sent to her pharmacy on 11/28/14, the pharmacy says they do not have it on file, pt asking for this to be resent, pt forgot about picking it up

## 2014-12-25 NOTE — Telephone Encounter (Signed)
Called and spoke to pharmacist at the [facility] and he said her Medicaid only has limited coverage and does not cover Metronidazole/Flagyl. The Rx I sent in was generic there is no other alternative way to send it. He says she will have to pay $15 out of pocket for the medication. Please inform patient of this.   Luiz Blare, DO 12/25/2014, 8:02 PM PGY-1, [place]

## 2014-12-25 NOTE — Telephone Encounter (Signed)
Spoke with Florina Ou at [place] at patient's medicaid doesn't cover flagyl.  Will send a message to the MD to see if they can call in plain metronidazole.  Checked formulary and this medication is preferred over the name brand.  Pt is aware that we are working on this. Korrey Schleicher,CMA

## 2015-02-28 DIAGNOSIS — Z202 Contact with and (suspected) exposure to infections with a predominantly sexual mode of transmission: Secondary | ICD-10-CM | POA: Insufficient documentation

## 2015-03-04 ENCOUNTER — Ambulatory Visit: Payer: Medicaid Other | Admitting: Certified Nurse Midwife

## 2015-03-13 ENCOUNTER — Ambulatory Visit (INDEPENDENT_AMBULATORY_CARE_PROVIDER_SITE_OTHER): Payer: Medicaid Other | Admitting: Certified Nurse Midwife

## 2015-03-13 DIAGNOSIS — Z0000 Encounter for general adult medical examination without abnormal findings: Secondary | ICD-10-CM

## 2015-03-13 DIAGNOSIS — Z01419 Encounter for gynecological examination (general) (routine) without abnormal findings: Secondary | ICD-10-CM

## 2015-03-14 NOTE — Progress Notes (Signed)
[name], female   DOB: 03-Jul-1991, 24 y.o.   MRN: [id number] Exam Rescheduled per patient request.

## 2015-03-20 ENCOUNTER — Ambulatory Visit (INDEPENDENT_AMBULATORY_CARE_PROVIDER_SITE_OTHER): Payer: Medicaid Other | Admitting: Certified Nurse Midwife

## 2015-03-20 ENCOUNTER — Encounter: Payer: Self-pay | Admitting: Certified Nurse Midwife

## 2015-03-20 ENCOUNTER — Other Ambulatory Visit: Payer: Self-pay | Admitting: Certified Nurse Midwife

## 2015-03-20 VITALS — BP 146/91 | HR 70 | Temp 97.0°F | Ht 67.0 in | Wt 176.0 lb

## 2015-03-20 DIAGNOSIS — Z01419 Encounter for gynecological examination (general) (routine) without abnormal findings: Secondary | ICD-10-CM

## 2015-03-20 DIAGNOSIS — Z309 Encounter for contraceptive management, unspecified: Secondary | ICD-10-CM

## 2015-03-20 NOTE — Progress Notes (Signed)
[name], female   DOB: 08-01-1991, 24 y.o.   MRN: [id number]   Subjective:     Amy Snyder is a 24 y.o. female here for a routine exam.  Current complaints:  Is sexually active, using condoms occassionally.  Discussed different birth control options, desires STD screening, and treatment for chronic BV.  Does not have a PCP that she sees on a regular basis for her HTN.      Personal health questionnaire:  Is patient Amy Snyder, have a family history of breast and/or ovarian cancer: no Is there a family history of uterine cancer diagnosed at age < 46, gastrointestinal cancer, urinary tract cancer, family member who is a Field seismologist syndrome-associated carrier: no Is the patient overweight and hypertensive, family history of diabetes, personal history of gestational diabetes, preeclampsia or PCOS: no Is patient over 62, have PCOS,  family history of premature CHD under age 49, diabetes, smoke, have hypertension or peripheral artery disease:  no At any time, has a partner hit, kicked or otherwise hurt or frightened you?: no Over the past 2 weeks, have you felt down, depressed or hopeless?: no Over the past 2 weeks, have you felt little interest or pleasure in doing things?:no   Gynecologic History Patient's last menstrual period was 03/10/2015. Contraception: condoms Last Pap: 11/2013. Results were: abnormal, had colpo Feb. 2015, cryo July 2015 Last mammogram: N/A.   Obstetric History OB History  Gravida Para Term Preterm AB SAB TAB Ectopic Multiple Living  1 1  1      1     # Outcome Date GA Lbr Len/2nd Weight Sex Delivery Anes PTL Lv  1 Preterm 10/21/09 [redacted]w[redacted]d  1.021 kg (2 lb 4 oz) M CS-Unspec EPI  Y     Comments: [place]      History reviewed. No pertinent past medical history.  Past Surgical History  Procedure Laterality Date  . Cesarean section  Oct 21, 2009     Current outpatient prescriptions:  .  carvedilol (COREG) 6.25 MG tablet, Take 1 tablet (6.25 mg  total) by mouth 2 (two) times daily with a meal. (Patient not taking: Reported on 03/20/2015), Disp: 60 tablet, Rfl: 11 .  triamterene-hydrochlorothiazide (DYAZIDE) 37.5-25 MG per capsule, Take 1 each (1 capsule total) by mouth daily. (Patient not taking: Reported on 03/20/2015), Disp: 30 capsule, Rfl: 11 No Known Allergies  History  Substance Use Topics  . Smoking status: Current Some Day Smoker -- 0.20 packs/day for 3 years    Types: Cigarettes  . Smokeless tobacco: Never Used     Comment: smokes socially  . Alcohol Use: 0.0 oz/week    0 Standard drinks or equivalent per week     Comment: socially    Family History  Problem Relation Age of Onset  . Hypertension Mother   . Diabetes        Review of Systems  Constitutional: negative for fatigue and weight loss Respiratory: negative for cough and wheezing Cardiovascular: negative for chest pain, fatigue and palpitations Gastrointestinal: negative for abdominal pain and change in bowel habits Musculoskeletal:negative for myalgias Neurological: negative for gait problems and tremors Behavioral/Psych: negative for abusive relationship, depression Endocrine: negative for temperature intolerance   Genitourinary:negative for abnormal menstrual periods, genital lesions, hot flashes, sexual problems and vaginal discharge Integument/breast: negative for breast lump, breast tenderness, nipple discharge and skin lesion(s)    Objective:       BP 146/91 mmHg  Pulse 70  Temp(Src) 97 F (36.1 C)  Ht  5\' 7"  (1.702 m)  Wt 79.833 kg (176 lb)  BMI 27.56 kg/m2  LMP 03/10/2015 General:   alert  Skin:   no rash or abnormalities  Lungs:   clear to auscultation bilaterally  Heart:   regular rate and rhythm, S1, S2 normal, no murmur, click, rub or gallop  Breasts:   normal without suspicious masses, skin or nipple changes or axillary nodes  Abdomen:  normal findings: no organomegaly, soft, non-tender and no hernia  Pelvis:  External genitalia:  normal general appearance Urinary system: urethral meatus normal and bladder without fullness, nontender Vaginal: normal without tenderness, induration or masses Cervix: normal appearance Adnexa: normal bimanual exam Uterus: anteverted and non-tender, normal size   Lab Review Urine pregnancy test Labs reviewed yes Radiologic studies reviewed no    Assessment:    Healthy female exam.   Bacterial Vaginosis   Plan:    Education reviewed: calcium supplements, depression evaluation, low fat, low cholesterol diet, safe sex/STD prevention, self breast exams, skin cancer screening and weight bearing exercise. Contraception: NuvaRing vaginal inserts. Follow up in: 1 year. Encouraged to seek STD screening at the HD d/t cost of out of pocket screening at the clinic, patient verbalized understating.  Patient was given two samples of the Nuva Ring as a trial.  She was also given Tindamax for her BV infection.   No orders of the defined types were placed in this encounter.   No orders of the defined types were placed in this encounter.

## 2015-03-21 LAB — PAP THINPREP ASCUS RFLX HPV RFLX TYPE

## 2015-03-26 LAB — HPV DNA, RELFEX TYPE 16/18: HPV DNA HIGH RISK: NOT DETECTED

## 2015-05-29 ENCOUNTER — Emergency Department (HOSPITAL_COMMUNITY)
Admission: EM | Admit: 2015-05-29 | Discharge: 2015-05-29 | Disposition: A | Discharge status: Home / Self Care | Payer: Medicaid Other | Attending: Emergency Medicine | Admitting: Emergency Medicine

## 2015-05-29 ENCOUNTER — Encounter (HOSPITAL_COMMUNITY): Payer: Self-pay | Admitting: *Deleted

## 2015-05-29 DIAGNOSIS — Z720 Tobacco use: Secondary | ICD-10-CM | POA: Insufficient documentation

## 2015-05-29 DIAGNOSIS — R200 Anesthesia of skin: Secondary | ICD-10-CM | POA: Insufficient documentation

## 2015-05-29 DIAGNOSIS — D171 Benign lipomatous neoplasm of skin and subcutaneous tissue of trunk: Secondary | ICD-10-CM | POA: Insufficient documentation

## 2015-05-29 MED ORDER — KETOROLAC TROMETHAMINE 60 MG/2ML IM SOLN
60.0000 mg | Freq: Once | INTRAMUSCULAR | Status: AC
  Administered 2015-05-29: 60 mg via INTRAMUSCULAR
  Filled 2015-05-29: qty 2

## 2015-05-29 NOTE — Progress Notes (Addendum)
ED CM called by Karrie Meres, NP/PA to inquiry of possible resource or assistance for medicaid pt having an elective surgery that is not covered by [facility] CM recommendation for pt to return to DSS to get assistance [address] [place] [phone] Entered d/c instruction section Please ask about possible assist for your elective surgery This surgery may be an out of pocket expense. [facility] unable to assist with this You may request to speak with a chs financial counselor but generally elective surgery are out of pocket expenses

## 2015-05-29 NOTE — Discharge Instructions (Signed)
Return to the emergency room with worsening of symptoms, new symptoms or with symptoms that are concerning, , especially fevers, abdominal pain in one area, unable to keep down fluids, blood in stool or vomit, severe pain, you feel faint, lightheaded or pass out. Call to follow up with [facility]. You may call at the [facility] on [address] in [place] for possible financial assistance. Their number is [phone] Read below information and follow recommendations.   Lipoma A lipoma is a noncancerous (benign) tumor composed of fat cells. They are usually found under the skin (subcutaneous). A lipoma may occur in any tissue of the body that contains fat. Common areas for lipomas to appear include the back, shoulders, buttocks, and thighs. Lipomas are a very common soft tissue growth. They are soft and grow slowly. Most problems caused by a lipoma depend on where it is growing. DIAGNOSIS  A lipoma can be diagnosed with a physical exam. These tumors rarely become cancerous, but radiographic studies can help determine this for certain. Studies used may include:  Computerized X-ray scans (CT or CAT scan).  Computerized magnetic scans (MRI). TREATMENT  Small lipomas that are not causing problems may be watched. If a lipoma continues to enlarge or causes problems, removal is often the best treatment. Lipomas can also be removed to improve appearance. Surgery is done to remove the fatty cells and the surrounding capsule. Most often, this is done with medicine that numbs the area (local anesthetic). The removed tissue is examined under a microscope to make sure it is not cancerous. Keep all follow-up appointments with your caregiver. SEEK MEDICAL CARE IF:   The lipoma becomes larger or hard.  The lipoma becomes painful, red, or increasingly swollen. These could be signs of infection or a more serious condition. Document Released: 11/26/2002 Document Revised:  02/28/2012 Document Reviewed: 05/08/2010 [facility] Patient Information 2015 [place], [place]. This information is not intended to replace advice given to you by your health care provider. Make sure you discuss any questions you have with your health care provider.

## 2015-05-29 NOTE — ED Provider Notes (Signed)
CSN: [id number]     Arrival date & time 05/29/15  1036 History   First MD Initiated Contact with Patient 05/29/15 1054     Chief Complaint  Patient presents with  . Abdominal Pain     (Consider location/radiation/quality/duration/timing/severity/associated sxs/prior Treatment) HPI  Amy Snyder is a 24 y.o. 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 Patient planned for surgery to remove him but states her Medicaid ran out. Patient denies any fevers, chills, nausea, vomiting, diarrhea. Last BM yesterday and normal. Patient states she has any and drinking like normal. Denies any other abdominal pain. No pelvic complaints or urinary complaints.   History reviewed. No pertinent past medical history. Past Surgical History  Procedure Laterality Date  . Cesarean section  Oct 21, 2009   Family History  Problem Relation Age of Onset  . Hypertension Mother   . Diabetes     History  Substance Use Topics  . Smoking status: Current Some Day Smoker -- 0.20 packs/day for 3 years    Types: Cigarettes  . Smokeless tobacco: Never Used     Comment: smokes socially  . Alcohol Use: 0.0 oz/week    0 Standard drinks or equivalent per week     Comment: socially   OB History    Gravida Para Term Preterm AB TAB SAB Ectopic Multiple Living   1 1  1      1      Review of Systems 10 Systems reviewed and are negative for acute change except as noted in the HPI.    Allergies  Review of patient's allergies indicates no known allergies.  Home Medications   Prior to Admission medications   Medication Sig Start Date End Date Taking? Authorizing Provider  carvedilol (COREG) 6.25 MG tablet Take 1 tablet (6.25 mg total) by mouth 2 (two) times daily with a meal. Patient not taking: Reported on 03/20/2015 07/03/14   Shelly Bombard, MD   triamterene-hydrochlorothiazide (DYAZIDE) 37.5-25 MG per capsule Take 1 each (1 capsule total) by mouth daily. Patient not taking: Reported on 03/20/2015 07/03/14   Shelly Bombard, MD   BP 142/104 mmHg  Pulse 68  Temp(Src) 98.6 F (37 C) (Oral)  Resp 20  Ht 5\' 7"  (1.702 m)  Wt 166 lb (75.297 kg)  BMI 25.99 kg/m2  SpO2 100%  LMP 05/28/2015 Physical Exam  Constitutional: She appears well-developed and well-nourished. No distress.  HENT:  Head: Normocephalic and atraumatic.  Mouth/Throat: Oropharynx is clear and moist.  Eyes: Conjunctivae and EOM are normal. Right eye exhibits no discharge. Left eye exhibits no discharge.  Cardiovascular: Normal rate and regular rhythm.   Pulmonary/Chest: Effort normal and breath sounds normal. No respiratory distress. She has no wheezes.  Abdominal: Soft. Bowel sounds are normal. She exhibits no distension. There is no tenderness.  2 lipomas to right lower quadrant. 2 cm soft mobile subcutaneous mass  without overlying skin changes. Second distal, 1 cm soft mobile subcutaneous mass. No overlying skin changes. Remainder of abdominal exam without tenderness.  Neurological: She is alert. She exhibits normal muscle tone. Coordination normal.  Skin: Skin is warm and dry. She is not diaphoretic.  Nursing note and vitals reviewed.   ED Course  Procedures (including critical care time) Labs Review Labs Reviewed - No data to display  Imaging Review No results found.   EKG Interpretation None  MDM   Final diagnoses:  Abdominal lipoma   Pt presenting with known lipomas in abdomen with reported increased pain and size. No associated abdominal symptoms. VSS. Abdomen with mild tenderness over lipomas without overlying skin changes. No evidence of peritonitis. Pt has been seen by [facility] [place] [facility] but concerned with financial issues with excision. Consult to case management. Spoke with Maudie Mercury who recommended patient discuss case with Dept of  Social Services for assistance. Patient is afebrile, nontoxic, and in no acute distress. Patient is appropriate for outpatient management and is stable for discharge. Surgical referral provided as well.   Discussed return precautions with patient. Discussed all results and patient verbalizes understanding and agrees with plan.   Al Corpus, PA-C 05/29/15 [place], MD 05/29/15 1311

## 2015-05-29 NOTE — ED Notes (Signed)
Pt states dx of lipomas in her abdominal wall.  States she was supposed to have them removed, but her medicaid ran out.  Recently experiencing numbness and pain to abdomen where lipomas are.

## 2015-07-22 ENCOUNTER — Emergency Department (HOSPITAL_COMMUNITY)
Admission: EM | Admit: 2015-07-22 | Discharge: 2015-07-22 | Disposition: A | Discharge status: Home / Self Care | Payer: Medicaid Other | Attending: Emergency Medicine | Admitting: Emergency Medicine

## 2015-07-22 ENCOUNTER — Encounter (HOSPITAL_COMMUNITY): Payer: Self-pay | Admitting: Vascular Surgery

## 2015-07-22 DIAGNOSIS — T7840XA Allergy, unspecified, initial encounter: Secondary | ICD-10-CM

## 2015-07-22 DIAGNOSIS — Z720 Tobacco use: Secondary | ICD-10-CM | POA: Insufficient documentation

## 2015-07-22 DIAGNOSIS — T373X5A Adverse effect of other antiprotozoal drugs, initial encounter: Secondary | ICD-10-CM | POA: Insufficient documentation

## 2015-07-22 DIAGNOSIS — T50905A Adverse effect of unspecified drugs, medicaments and biological substances, initial encounter: Secondary | ICD-10-CM

## 2015-07-22 DIAGNOSIS — Z79899 Other long term (current) drug therapy: Secondary | ICD-10-CM | POA: Insufficient documentation

## 2015-07-22 DIAGNOSIS — I10 Essential (primary) hypertension: Secondary | ICD-10-CM | POA: Insufficient documentation

## 2015-07-22 DIAGNOSIS — Z8742 Personal history of other diseases of the female genital tract: Secondary | ICD-10-CM | POA: Insufficient documentation

## 2015-07-22 DIAGNOSIS — L509 Urticaria, unspecified: Secondary | ICD-10-CM | POA: Insufficient documentation

## 2015-07-22 DIAGNOSIS — R000 Tachycardia, unspecified: Secondary | ICD-10-CM | POA: Insufficient documentation

## 2015-07-22 HISTORY — DX: Other specified bacterial agents as the cause of diseases classified elsewhere: B96.89

## 2015-07-22 HISTORY — DX: Other specified bacterial agents as the cause of diseases classified elsewhere: N76.0

## 2015-07-22 MED ORDER — FAMOTIDINE IN NACL 20-0.9 MG/50ML-% IV SOLN
20.0000 mg | Freq: Once | INTRAVENOUS | Status: AC
  Administered 2015-07-22: 20 mg via INTRAVENOUS
  Filled 2015-07-22: qty 50

## 2015-07-22 MED ORDER — SODIUM CHLORIDE 0.9 % IV BOLUS (SEPSIS)
1000.0000 mL | Freq: Once | INTRAVENOUS | Status: AC
  Administered 2015-07-22: 1000 mL via INTRAVENOUS

## 2015-07-22 MED ORDER — DIPHENHYDRAMINE HCL 25 MG PO TABS: 25.0000 mg | ORAL_TABLET | Freq: Four times a day (QID) | ORAL | Status: DC

## 2015-07-22 MED ORDER — METHYLPREDNISOLONE SODIUM SUCC 125 MG IJ SOLR
125.0000 mg | Freq: Once | INTRAMUSCULAR | Status: AC
  Administered 2015-07-22: 125 mg via INTRAVENOUS
  Filled 2015-07-22: qty 2

## 2015-07-22 MED ORDER — FAMOTIDINE 20 MG PO TABS: 20.0000 mg | ORAL_TABLET | Freq: Two times a day (BID) | ORAL | Status: DC

## 2015-07-22 MED ORDER — DIPHENHYDRAMINE HCL 50 MG/ML IJ SOLN
25.0000 mg | Freq: Once | INTRAMUSCULAR | Status: AC
  Administered 2015-07-22: 25 mg via INTRAVENOUS
  Filled 2015-07-22: qty 1

## 2015-07-22 NOTE — ED Provider Notes (Signed)
CSN: [id number]     Arrival date & time 07/22/15  1043 History   First MD Initiated Contact with Patient 07/22/15 1050     Chief Complaint  Patient presents with  . Allergic Reaction     (Consider location/radiation/quality/duration/timing/severity/associated sxs/prior Treatment) HPI Comments: 24 year old female presenting with concerns of an allergic reaction to Flagyl. States she was prescribed Flagyl by the health department, took 4 tablets early this morning, and 10 minutes later, she developed burning in her arms with itching, shortness of breath, sensation that her face was swelling, nasal congestion and a rash on her chest. States she has taken Flagyl in the past without any reaction. No known allergies. Denies any new soaps, detergents, lotions, foods. Denies wheezing. Denies swelling of her lips or tongue. No alleviating factors. No medications prior to arrival.  Patient is a 24 y.o. female presenting with allergic reaction. The history is provided by the patient.  Allergic Reaction Presenting symptoms: rash     Past Medical History  Diagnosis Date  . Hypertension   . BV (bacterial vaginosis)    Past Surgical History  Procedure Laterality Date  . Cesarean section  Oct 21, 2009   Family History  Problem Relation Age of Onset  . Hypertension Mother   . Diabetes     History  Substance Use Topics  . Smoking status: Current Some Day Smoker -- 0.20 packs/day for 3 years    Types: Cigarettes  . Smokeless tobacco: Never Used     Comment: smokes socially  . Alcohol Use: 0.0 oz/week    0 Standard drinks or equivalent per week     Comment: socially   OB History    Gravida Para Term Preterm AB TAB SAB Ectopic Multiple Living   1 1  1      1      Review of Systems  HENT: Positive for congestion.   Respiratory: Positive for cough and shortness of breath.   Skin: Positive for rash.  All other systems reviewed and are negative.     Allergies  Flagyl  Home Medications    Prior to Admission medications   Medication Sig Start Date End Date Taking? Authorizing Provider  carvedilol (COREG) 6.25 MG tablet Take 1 tablet (6.25 mg total) by mouth 2 (two) times daily with a meal. 07/03/14  Yes Shelly Bombard, MD  triamterene-hydrochlorothiazide (DYAZIDE) 37.5-25 MG per capsule Take 1 each (1 capsule total) by mouth daily. 07/03/14  Yes Shelly Bombard, MD  diphenhydrAMINE (BENADRYL) 25 MG tablet Take 1 tablet (25 mg total) by mouth every 6 (six) hours. 07/22/15   Carman Ching, PA-C  famotidine (PEPCID) 20 MG tablet Take 1 tablet (20 mg total) by mouth 2 (two) times daily. 07/22/15   Laquasia Pincus M Vegas Fritze, PA-C   BP 131/72 mmHg  Pulse 77  Temp(Src) 97.8 F (36.6 C) (Oral)  Resp 16  SpO2 100%  LMP 06/24/2015 (Approximate) Physical Exam  Constitutional: She is oriented to person, place, and time. She appears well-developed and well-nourished. No distress.  HENT:  Head: Normocephalic and atraumatic.  Nose: Mucosal edema present.  Mouth/Throat: Uvula is midline and oropharynx is clear and moist. No uvula swelling.  No angioedema.  Eyes: Conjunctivae and EOM are normal.  Neck: Normal range of motion. Neck supple.  Cardiovascular: Regular rhythm and normal heart sounds.   Mild tachy.  Pulmonary/Chest: Effort normal and breath sounds normal. No respiratory distress.  Musculoskeletal: Normal range of motion. She exhibits no edema.  Neurological:  She is alert and oriented to person, place, and time. No sensory deficit.  Skin: Skin is warm and dry.  Urticaria central chest.  Psychiatric: She has a normal mood and affect. Her behavior is normal.  Nursing note and vitals reviewed.   ED Course  Procedures (including critical care time) Labs Review Labs Reviewed - No data to display  Imaging Review No results found.   EKG Interpretation None      MDM   Final diagnoses:  Medication reaction, initial encounter  Allergic reaction caused by a drug   Non-toxic  appearing, NAD. Afebrile. Mild tachycardia. Vitals otherwise stable. No respiratory or airway compromise. No angioedema. She does have urticaria on central chest. Patient given Solu-Medrol, Pepcid and benadryl with complete relief of her symptoms. Rash subsided. No longer tachycardic. Will discharge home with Pepcid and Benadryl. Advised her to no longer take Flagyl. This was added to her allergy list. Follow-up with PCP within one week. Stable for discharge. Return precautions given. Patient states understanding of treatment care plan and is agreeable.  Carman Ching, PA-C 07/22/15 [place], MD 07/22/15 [phone]

## 2015-07-22 NOTE — Discharge Instructions (Signed)
Take pepcid as directed and benadryl as directed as needed for itching.  No longer take flagyl.  Allergies Allergies may happen from anything your body is sensitive to. This may be food, medicines, pollens, chemicals, and nearly anything around you in everyday life that produces allergens. An allergen is anything that causes an allergy producing substance. Heredity is often a factor in causing these problems. This means you may have some of the same allergies as your parents. Food allergies happen in all age groups. Food allergies are some of the most severe and life threatening. Some common food allergies are cow's milk, seafood, eggs, nuts, wheat, and soybeans. SYMPTOMS   Swelling around the mouth.  An itchy red rash or hives.  Vomiting or diarrhea.  Difficulty breathing. SEVERE ALLERGIC REACTIONS ARE LIFE-THREATENING. This reaction is called anaphylaxis. It can cause the mouth and throat to swell and cause difficulty with breathing and swallowing. In severe reactions only a trace amount of food (for example, peanut oil in a salad) may cause death within seconds. Seasonal allergies occur in all age groups. These are seasonal because they usually occur during the same season every year. They may be a reaction to molds, grass pollens, or tree pollens. Other causes of problems are house dust mite allergens, pet dander, and mold spores. The symptoms often consist of nasal congestion, a runny itchy nose associated with sneezing, and tearing itchy eyes. There is often an associated itching of the mouth and ears. The problems happen when you come in contact with pollens and other allergens. Allergens are the particles in the air that the body reacts to with an allergic reaction. This causes you to release allergic antibodies. Through a chain of events, these eventually cause you to release histamine into the blood stream. Although it is meant to be protective to the body, it is this release that causes  your discomfort. This is why you were given anti-histamines to feel better. If you are unable to pinpoint the offending allergen, it may be determined by skin or blood testing. Allergies cannot be cured but can be controlled with medicine. Hay fever is a collection of all or some of the seasonal allergy problems. It may often be treated with simple over-the-counter medicine such as diphenhydramine. Take medicine as directed. Do not drink alcohol or drive while taking this medicine. Check with your caregiver or package insert for child dosages. If these medicines are not effective, there are many new medicines your caregiver can prescribe. Stronger medicine such as nasal spray, eye drops, and corticosteroids may be used if the first things you try do not work well. Other treatments such as immunotherapy or desensitizing injections can be used if all else fails. Follow up with your caregiver if problems continue. These seasonal allergies are usually not life threatening. They are generally more of a nuisance that can often be handled using medicine. HOME CARE INSTRUCTIONS   If unsure what causes a reaction, keep a diary of foods eaten and symptoms that follow. Avoid foods that cause reactions.  If hives or rash are present:  Take medicine as directed.  You may use an over-the-counter antihistamine (diphenhydramine) for hives and itching as needed.  Apply cold compresses (cloths) to the skin or take baths in cool water. Avoid hot baths or showers. Heat will make a rash and itching worse.  If you are severely allergic:  Following a treatment for a severe reaction, hospitalization is often required for closer follow-up.  Wear a medic-alert bracelet  or necklace stating the allergy.  You and your family must learn how to give adrenaline or use an anaphylaxis kit.  If you have had a severe reaction, always carry your anaphylaxis kit or EpiPen with you. Use this medicine as directed by your caregiver  if a severe reaction is occurring. Failure to do so could have a fatal outcome. SEEK MEDICAL CARE IF:  You suspect a food allergy. Symptoms generally happen within 30 minutes of eating a food.  Your symptoms have not gone away within 2 days or are getting worse.  You develop new symptoms.  You want to retest yourself or your child with a food or drink you think causes an allergic reaction. Never do this if an anaphylactic reaction to that food or drink has happened before. Only do this under the care of a caregiver. SEEK IMMEDIATE MEDICAL CARE IF:   You have difficulty breathing, are wheezing, or have a tight feeling in your chest or throat.  You have a swollen mouth, or you have hives, swelling, or itching all over your body.  You have had a severe reaction that has responded to your anaphylaxis kit or an EpiPen. These reactions may return when the medicine has worn off. These reactions should be considered life threatening. MAKE SURE YOU:   Understand these instructions.  Will watch your condition.  Will get help right away if you are not doing well or get worse. Document Released: 03/01/2003 Document Revised: 04/02/2013 Document Reviewed: 08/05/2008 [facility] Patient Information 2015 [place], [place]. This information is not intended to replace advice given to you by your health care provider. Make sure you discuss any questions you have with your health care provider.  Angioedema Angioedema is a sudden swelling of tissues, often of the skin. It can occur on the face or genitals or in the abdomen or other body parts. The swelling usually develops over a short period and gets better in 24 to 48 hours. It often begins during the night and is found when the person wakes up. The person may also get red, itchy patches of skin (hives). Angioedema can be dangerous if it involves swelling of the air passages.  Depending on the cause, episodes of angioedema may only happen once, come back in  unpredictable patterns, or repeat for several years and then gradually fade away.  CAUSES  Angioedema can be caused by an allergic reaction to various triggers. It can also result from nonallergic causes, including reactions to drugs, immune system disorders, viral infections, or an abnormal gene that is passed to you from your parents (hereditary). For some people with angioedema, the cause is unknown.  Some things that can trigger angioedema include:   Foods.   Medicines, such as ACE inhibitors, ARBs, nonsteroidal anti-inflammatory agents, or estrogen.   Latex.   Animal saliva.   Insect stings.   Dyes used in X-rays.   Mild injury.   Dental work.  Surgery.  Stress.   Sudden changes in temperature.   Exercise. SIGNS AND SYMPTOMS   Swelling of the skin.  Hives. If these are present, there is also intense itching.  Redness in the affected area.   Pain in the affected area.  Swollen lips or tongue.  Breathing problems. This may happen if the air passages swell.  Wheezing. If internal organs are involved, there may be:   Nausea.   Abdominal pain.   Vomiting.   Difficulty swallowing.   Difficulty passing urine. DIAGNOSIS   Your health care provider  will examine the affected area and take a medical and family history.  Various tests may be done to help determine the cause. Tests may include:  Allergy skin tests to see if the problem is an allergic reaction.   Blood tests to check for hereditary angioedema.   Tests to check for underlying diseases that could cause the condition.   A review of your medicines, including over-the-counter medicines, may be done. TREATMENT  Treatment will depend on the cause of the angioedema. Possible treatments include:   Removal of anything that triggered the condition (such as stopping certain medicines).   Medicines to treat symptoms or prevent attacks. Medicines given may include:    Antihistamines.   Epinephrine injection.   Steroids.   Hospitalization may be required for severe attacks. If the air passages are affected, it can be an emergency. Tubes may need to be placed to keep the airway open. HOME CARE INSTRUCTIONS   Take all medicines as directed by your health care provider.  If you were given medicines for emergency allergy treatment, always carry them with you.  Wear a medical bracelet as directed by your health care provider.   Avoid known triggers. SEEK MEDICAL CARE IF:   You have repeat attacks of angioedema.   Your attacks are more frequent or more severe despite preventive measures.   You have hereditary angioedema and are considering having children. It is important to discuss with your health care provider the risks of passing the condition on to your children. SEEK IMMEDIATE MEDICAL CARE IF:   You have severe swelling of the mouth, tongue, or lips.  You have difficulty breathing.   You have difficulty swallowing.   You faint. MAKE SURE YOU:  Understand these instructions.  Will watch your condition.  Will get help right away if you are not doing well or get worse. Document Released: 02/14/2002 Document Revised: 04/22/2014 Document Reviewed: 07/30/2013 [facility] Patient Information 2015 [place], [place]. This information is not intended to replace advice given to you by your health care provider. Make sure you discuss any questions you have with your health care provider.

## 2015-07-22 NOTE — ED Notes (Signed)
Pt reports to the ED for eval of possible allergic rxn. She reports she took some Flagyl today and approx 10 mins later developed SOB, nasal congestion, and cough. She also reports it feels like her face is swelling. Airway intact. Also complaining of "bumps" on her chest, reddened palms, and generalized itching. Pt A&Ox4, resp e/u, and skin warm and dry. Lung sounds clear.

## 2016-03-17 ENCOUNTER — Ambulatory Visit (INDEPENDENT_AMBULATORY_CARE_PROVIDER_SITE_OTHER): Payer: Medicaid Other | Admitting: Obstetrics

## 2016-03-17 VITALS — BP 156/106 | HR 75 | Wt 179.0 lb

## 2016-03-17 DIAGNOSIS — I10 Essential (primary) hypertension: Secondary | ICD-10-CM

## 2016-03-17 DIAGNOSIS — Z113 Encounter for screening for infections with a predominantly sexual mode of transmission: Secondary | ICD-10-CM | POA: Diagnosis not present

## 2016-03-17 NOTE — Progress Notes (Signed)
[name], female   DOB: 03-13-1991, 25 y.o.   MRN: [id number]  Chief Complaint  Patient presents with  . Gynecologic Exam    pt would like STD testing and UPT, Pt has increase in BP today- has not been taking medication.    HPI Amy Snyder is a 25 y.o. female.  Possible STD exposure.  HPI  Past Medical History  Diagnosis Date  . Hypertension   . BV (bacterial vaginosis)     Past Surgical History  Procedure Laterality Date  . Cesarean section  Oct 21, 2009    Family History  Problem Relation Age of Onset  . Hypertension Mother   . Diabetes      Social History Social History  Substance Use Topics  . Smoking status: Current Some Day Smoker -- 0.20 packs/day for 3 years    Types: Cigarettes  . Smokeless tobacco: Never Used     Comment: smokes socially  . Alcohol Use: 0.0 oz/week    0 Standard drinks or equivalent per week     Comment: socially    Allergies  Allergen Reactions  . Flagyl [Metronidazole] Itching, Swelling and Rash    Current Outpatient Prescriptions  Medication Sig Dispense Refill  . carvedilol (COREG) 6.25 MG tablet Take 1 tablet (6.25 mg total) by mouth 2 (two) times daily with a meal. (Patient not taking: Reported on 03/17/2016) 60 tablet 11  . diphenhydrAMINE (BENADRYL) 25 MG tablet Take 1 tablet (25 mg total) by mouth every 6 (six) hours. (Patient not taking: Reported on 03/17/2016) 20 tablet 0  . famotidine (PEPCID) 20 MG tablet Take 1 tablet (20 mg total) by mouth 2 (two) times daily. (Patient not taking: Reported on 03/17/2016) 30 tablet 0  . triamterene-hydrochlorothiazide (DYAZIDE) 37.5-25 MG per capsule Take 1 each (1 capsule total) by mouth daily. (Patient not taking: Reported on 03/17/2016) 30 capsule 11   No current facility-administered medications for this visit.    Review of Systems Review of Systems Constitutional: negative for fatigue and weight loss Respiratory: negative for cough and wheezing Cardiovascular:  negative for chest pain, fatigue and palpitations Gastrointestinal: negative for abdominal pain and change in bowel habits Genitourinary:negative Integument/breast: negative for nipple discharge Musculoskeletal:negative for myalgias Neurological: negative for gait problems and tremors Behavioral/Psych: negative for abusive relationship, depression Endocrine: negative for temperature intolerance     Blood pressure 156/106, pulse 75, weight 179 lb (81.194 kg), last menstrual period 02/25/2016.  Physical Exam Physical Exam General:   alert  Skin:   no rash or abnormalities  Lungs:   clear to auscultation bilaterally  Heart:   regular rate and rhythm, S1, S2 normal, no murmur, click, rub or gallop  Breasts:   normal without suspicious masses, skin or nipple changes or axillary nodes  Abdomen:  normal findings: no organomegaly, soft, non-tender and no hernia  Pelvis:  External genitalia: normal general appearance Urinary system: urethral meatus normal and bladder without fullness, nontender Vaginal: normal without tenderness, induration or masses Cervix: normal appearance Adnexa: normal bimanual exam Uterus: anteverted and non-tender, normal size      Data Reviewed Wet preps and STD cultures  Assessment     STD Screen     Plan    Wet prep and GC / Chlamydia cultures ordered F/U prn  Orders Placed This Encounter  Procedures  . NuSwab Vaginitis Plus (VG+)   No orders of the defined types were placed in this encounter.

## 2016-03-20 ENCOUNTER — Other Ambulatory Visit: Payer: Self-pay | Admitting: Obstetrics

## 2016-03-20 DIAGNOSIS — N760 Acute vaginitis: Principal | ICD-10-CM

## 2016-03-20 DIAGNOSIS — B9689 Other specified bacterial agents as the cause of diseases classified elsewhere: Secondary | ICD-10-CM

## 2016-03-20 LAB — NUSWAB VAGINITIS PLUS (VG+)
ATOPOBIUM VAGINAE: HIGH {score} — AB
BVAB 2: HIGH {score} — AB
CANDIDA ALBICANS, NAA: NEGATIVE
CANDIDA GLABRATA, NAA: NEGATIVE
Chlamydia trachomatis, NAA: NEGATIVE
Megasphaera 1: HIGH Score — AB
Neisseria gonorrhoeae, NAA: NEGATIVE
TRICH VAG BY NAA: NEGATIVE

## 2016-03-20 MED ORDER — CLINDAMYCIN HCL 300 MG PO CAPS: 300.0000 mg | ORAL_CAPSULE | Freq: Three times a day (TID) | ORAL | Status: DC

## 2016-03-26 ENCOUNTER — Encounter (HOSPITAL_COMMUNITY): Payer: Self-pay | Admitting: *Deleted

## 2016-03-26 ENCOUNTER — Ambulatory Visit: Payer: Medicaid Other | Admitting: Internal Medicine

## 2016-03-26 ENCOUNTER — Emergency Department (HOSPITAL_COMMUNITY): Payer: Medicaid Other

## 2016-03-26 ENCOUNTER — Emergency Department (HOSPITAL_COMMUNITY)
Admission: EM | Admit: 2016-03-26 | Discharge: 2016-03-27 | Disposition: A | Discharge status: Home / Self Care | Payer: Medicaid Other | Attending: Emergency Medicine | Admitting: Emergency Medicine

## 2016-03-26 DIAGNOSIS — Z79899 Other long term (current) drug therapy: Secondary | ICD-10-CM | POA: Diagnosis not present

## 2016-03-26 DIAGNOSIS — Z8742 Personal history of other diseases of the female genital tract: Secondary | ICD-10-CM | POA: Insufficient documentation

## 2016-03-26 DIAGNOSIS — I10 Essential (primary) hypertension: Secondary | ICD-10-CM | POA: Diagnosis not present

## 2016-03-26 DIAGNOSIS — Z792 Long term (current) use of antibiotics: Secondary | ICD-10-CM | POA: Insufficient documentation

## 2016-03-26 DIAGNOSIS — R079 Chest pain, unspecified: Secondary | ICD-10-CM | POA: Diagnosis present

## 2016-03-26 DIAGNOSIS — Z3202 Encounter for pregnancy test, result negative: Secondary | ICD-10-CM | POA: Diagnosis not present

## 2016-03-26 DIAGNOSIS — F17210 Nicotine dependence, cigarettes, uncomplicated: Secondary | ICD-10-CM | POA: Insufficient documentation

## 2016-03-26 LAB — BASIC METABOLIC PANEL
Anion gap: 10 (ref 5–15)
BUN: 7 mg/dL (ref 6–20)
CALCIUM: 9.9 mg/dL (ref 8.9–10.3)
CHLORIDE: 101 mmol/L (ref 101–111)
CO2: 25 mmol/L (ref 22–32)
Creatinine, Ser: 0.76 mg/dL (ref 0.44–1.00)
GFR calc Af Amer: >60 mL/min (ref >60)
GFR calc non Af Amer: >60 mL/min (ref >60)
Glucose, Bld: 100 mg/dL — ABNORMAL HIGH (ref 65–99)
Potassium: 3.4 mmol/L — ABNORMAL LOW (ref 3.5–5.1)
Sodium: 136 mmol/L (ref 135–145)

## 2016-03-26 LAB — CBC WITH DIFFERENTIAL/PLATELET
BASOS PCT: 0 %
Basophils Absolute: 0 10*3/uL (ref 0.0–0.1)
Eosinophils Absolute: 0.1 10*3/uL (ref 0.0–0.7)
Eosinophils Relative: 1 %
HCT: 42.7 % (ref 36.0–46.0)
HEMOGLOBIN: 14.8 g/dL (ref 12.0–15.0)
Lymphocytes Relative: 38 %
Lymphs Abs: 3.4 10*3/uL (ref 0.7–4.0)
MCH: 30.5 pg (ref 26.0–34.0)
MCHC: 34.7 g/dL (ref 30.0–36.0)
MCV: 88 fL (ref 78.0–100.0)
MONOS PCT: 6 %
Monocytes Absolute: 0.6 10*3/uL (ref 0.1–1.0)
Neutro Abs: 5 10*3/uL (ref 1.7–7.7)
Neutrophils Relative %: 55 %
Platelets: 274 10*3/uL (ref 150–400)
RBC: 4.85 MIL/uL (ref 3.87–5.11)
RDW: 12.4 % (ref 11.5–15.5)
WBC: 9.1 10*3/uL (ref 4.0–10.5)

## 2016-03-26 LAB — I-STAT BETA HCG BLOOD, ED (MC, WL, AP ONLY)

## 2016-03-26 LAB — TROPONIN I

## 2016-03-26 MED ORDER — KETOROLAC TROMETHAMINE 60 MG/2ML IM SOLN
60.0000 mg | Freq: Once | INTRAMUSCULAR | Status: AC
  Administered 2016-03-26: 60 mg via INTRAMUSCULAR
  Filled 2016-03-26: qty 2

## 2016-03-26 NOTE — ED Provider Notes (Signed)
CSN: [id number]     Arrival date & time 03/26/16  1945 History   First MD Initiated Contact with Patient 03/26/16 2159     Chief Complaint  Patient presents with  . Chest Pain   (Consider location/radiation/quality/duration/timing/severity/associated sxs/prior Treatment) The history is provided by the patient. No language interpreter was used.    Amy Snyder is a 25 y.o female with A history of hypertension who presents with central chest pain after taking her blood pressure medication around 6 PM today. She states she was recently started on blood pressure medications a few days ago.Denies any radiation of the pain. Worse with swelling. Reports that it was 8/10 when it first began. She vomited 1 at 7 PM. Now pain is 2/10. Denies any treatment prior to arrival. Denies any family history of cardiac disease. She denies any recent travel. She is not on birth control. Denies any fever, chills, night sweats, abdominal pain, diarrhea, constipation, leg swelling or pain.  Past Medical History  Diagnosis Date  . Hypertension   . BV (bacterial vaginosis)    Past Surgical History  Procedure Laterality Date  . Cesarean section  Oct 21, 2009   Family History  Problem Relation Age of Onset  . Hypertension Mother   . Diabetes     Social History  Substance Use Topics  . Smoking status: Current Some Day Smoker -- 0.20 packs/day for 3 years    Types: Cigarettes  . Smokeless tobacco: Never Used     Comment: smokes socially  . Alcohol Use: 0.0 oz/week    0 Standard drinks or equivalent per week     Comment: socially   OB History    Gravida Para Term Preterm AB TAB SAB Ectopic Multiple Living   1 1  1      1      Review of Systems  Respiratory: Negative for shortness of breath.   Cardiovascular: Positive for chest pain.  All other systems reviewed and are negative.     Allergies  Flagyl  Home Medications   Prior to Admission medications   Medication Sig Start Date End Date Taking?  Authorizing Provider  carvedilol (COREG) 6.25 MG tablet TAKE ONE TABLET BY MOUTH TWICE DAILY WITH MEALS 03/20/16  Yes Shelly Bombard, MD  clindamycin (CLEOCIN) 300 MG capsule Take 1 capsule (300 mg total) by mouth 3 (three) times daily. Patient taking differently: Take 300 mg by mouth 3 (three) times daily. Started 03/23/16, for 7 days ending 03/29/16 03/20/16  Yes Shelly Bombard, MD  triamterene-hydrochlorothiazide (DYAZIDE) 37.5-25 MG capsule TAKE ONE CAPSULE BY MOUTH ONCE DAILY 03/20/16  Yes Shelly Bombard, MD  diphenhydrAMINE (BENADRYL) 25 MG tablet Take 1 tablet (25 mg total) by mouth every 6 (six) hours. Patient not taking: Reported on 03/17/2016 07/22/15   Carman Ching, PA-C  famotidine (PEPCID) 20 MG tablet Take 1 tablet (20 mg total) by mouth 2 (two) times daily. Patient not taking: Reported on 03/17/2016 07/22/15   Hessie Diener Hess, PA-C   BP 145/88 mmHg  Pulse 65  Temp(Src) 98.3 F (36.8 C) (Oral)  Resp 19  SpO2 100%  LMP 02/25/2016 Physical Exam  Constitutional: She is oriented to person, place, and time. She appears well-developed and well-nourished. No distress.  HENT:  Head: Normocephalic and atraumatic.  Eyes: Conjunctivae are normal.  Neck: Normal range of motion. Neck supple.  Cardiovascular: Normal rate, regular rhythm and normal heart sounds.   No murmur heard. Regular rate and rhythm. No murmur.  Pulmonary/Chest: Effort normal and breath sounds normal. No respiratory distress. She has no wheezes. She has no rales. She exhibits no tenderness.  Lungs clear to auscultation bilaterally. No respiratory distress. 100% oxygen on room air.  Abdominal: Soft. There is no tenderness.  Musculoskeletal: Normal range of motion. She exhibits no edema or tenderness.  No lower extremity calf swelling or tenderness.   Neurological: She is alert and oriented to person, place, and time.  Skin: Skin is warm and dry. She is not diaphoretic.  Psychiatric: She has a normal mood and affect.  Nursing  note and vitals reviewed.   ED Course  Procedures (including critical care time) Labs Review Labs Reviewed  BASIC METABOLIC PANEL - Abnormal; Notable for the following:    Potassium 3.4 (*)    Glucose, Bld 100 (*)    All other components within normal limits  TROPONIN I  CBC WITH DIFFERENTIAL/PLATELET  TROPONIN I  I-STAT BETA HCG BLOOD, ED ([facility], [facility], [facility] ONLY)    Imaging Review Dg Chest 2 View  03/26/2016  CLINICAL DATA:  pt was just started on bp med in the past few days. The pt took her bp med at work around 1800 and after that she started having chest pain. She has chest pain now. EXAM: CHEST  2 VIEW COMPARISON:  None. FINDINGS: The cardiac silhouette is top-normal in size. Normal mediastinal and hilar contours. Both lungs are clear. No pleural effusion or pneumothorax. The visualized skeletal structures are unremarkable. IMPRESSION: No active cardiopulmonary disease. Electronically Signed   By: Lajean Manes M.D.   On: 03/26/2016 21:04   I have personally reviewed and evaluated these images and lab results as part of my medical decision-making.  ED ECG REPORT   Date: 03/27/2016  Rate: 69  Rhythm: normal sinus rhythm  QRS Axis: normal  Intervals: normal  ST/T Wave abnormalities: nonspecific T wave changes  Conduction Disutrbances:none  Narrative Interpretation:   Old EKG Reviewed: none available  I have personally reviewed the EKG tracing and agree with the computerized printout as noted.   MDM   Final diagnoses:  Chest pain, unspecified chest pain type   Patient presents for chest pain that began around 6 hours ago. Since gotten better on its own without any treatment. She has a potassium of 3.4 but otherwise labs are unremarkable. She is not pregnant. Troponin is negative. Chest x-ray is negative for pneumonia, edema, or pneumothorax. We will obtain repeat troponin. Second troponin is also negative.  Reviewed heart score and patient is at low risk for major cardiac  event. She is PERC negative.  I discussed findings with the patient as well as return precautions and follow up.  Patient agrees with plan.   Medications  ketorolac (TORADOL) injection 60 mg (60 mg Intramuscular Given 03/26/16 2345)   Filed Vitals:   03/26/16 2145 03/26/16 2200  BP: 137/93 145/88  Pulse: 63 65  Temp:    Resp: 12 [id number], PA-C 03/27/16 0114  Daleen Bo, MD 03/27/16 [phone]

## 2016-03-26 NOTE — Discharge Instructions (Signed)
Nonspecific Chest Pain Follow up with your family doctor in 2 days. Return for chest pain, shortness of breath, diaphoresis, vomiting. Take BP medications as prescribed. It is often hard to find the cause of chest pain. There is always a chance that your pain could be related to something serious, such as a heart attack or a blood clot in your lungs. Chest pain can also be caused by conditions that are not life-threatening. If you have chest pain, it is very important to follow up with your doctor.  HOME CARE  If you were prescribed an antibiotic medicine, finish it all even if you start to feel better.  Avoid any activities that cause chest pain.  Do not use any tobacco products, including cigarettes, chewing tobacco, or electronic cigarettes. If you need help quitting, ask your doctor.  Do not drink alcohol.  Take medicines only as told by your doctor.  Keep all follow-up visits as told by your doctor. This is important. This includes any further testing if your chest pain does not go away.  Your doctor may tell you to keep your head raised (elevated) while you sleep.  Make lifestyle changes as told by your doctor. These may include:  Getting regular exercise. Ask your doctor to suggest some activities that are safe for you.  Eating a heart-healthy diet. Your doctor or a diet specialist (dietitian) can help you to learn healthy eating options.  Maintaining a healthy weight.  Managing diabetes, if necessary.  Reducing stress. GET HELP IF:  Your chest pain does not go away, even after treatment.  You have a rash with blisters on your chest.  You have a fever. GET HELP RIGHT AWAY IF:  Your chest pain is worse.  You have an increasing cough, or you cough up blood.  You have severe belly (abdominal) pain.  You feel extremely weak.  You pass out (faint).  You have chills.  You have sudden, unexplained chest discomfort.  You have sudden, unexplained discomfort in your  arms, back, neck, or jaw.  You have shortness of breath at any time.  You suddenly start to sweat, or your skin gets clammy.  You feel nauseous.  You vomit.  You suddenly feel light-headed or dizzy.  Your heart begins to beat quickly, or it feels like it is skipping beats. These symptoms may be an emergency. Do not wait to see if the symptoms will go away. Get medical help right away. Call your local emergency services (911 in the [place] Do not drive yourself to the hospital.   This information is not intended to replace advice given to you by your health care provider. Make sure you discuss any questions you have with your health care provider.   Document Released: 05/24/2008 Document Revised: 12/27/2014 Document Reviewed: 07/12/2014 Elsevier Interactive Patient Education [employer].

## 2016-03-26 NOTE — ED Notes (Signed)
The pt was just started on bp med in the past few days.  The pt took her bp med at work around 1800 and after that she started having chest pain.  She has chest pain now.  No distress lmp last month

## 2016-03-27 LAB — TROPONIN I

## 2016-03-27 MED ORDER — SODIUM CHLORIDE 0.9 % IV BOLUS (SEPSIS): 500.0000 mL | Freq: Once | INTRAVENOUS | Status: DC

## 2016-04-08 ENCOUNTER — Ambulatory Visit: Payer: Medicaid Other | Admitting: Family Medicine

## 2016-04-13 ENCOUNTER — Ambulatory Visit: Payer: Medicaid Other | Admitting: Family Medicine

## 2016-04-16 ENCOUNTER — Ambulatory Visit: Payer: Medicaid Other | Admitting: Family Medicine

## 2016-04-22 ENCOUNTER — Emergency Department (HOSPITAL_COMMUNITY)
Admission: EM | Admit: 2016-04-22 | Discharge: 2016-04-22 | Disposition: A | Discharge status: Home / Self Care | Payer: Medicaid Other | Attending: Emergency Medicine | Admitting: Emergency Medicine

## 2016-04-22 ENCOUNTER — Encounter (HOSPITAL_COMMUNITY): Payer: Self-pay | Admitting: Emergency Medicine

## 2016-04-22 DIAGNOSIS — Z79899 Other long term (current) drug therapy: Secondary | ICD-10-CM | POA: Insufficient documentation

## 2016-04-22 DIAGNOSIS — R05 Cough: Secondary | ICD-10-CM | POA: Diagnosis present

## 2016-04-22 DIAGNOSIS — I10 Essential (primary) hypertension: Secondary | ICD-10-CM | POA: Insufficient documentation

## 2016-04-22 DIAGNOSIS — F17210 Nicotine dependence, cigarettes, uncomplicated: Secondary | ICD-10-CM | POA: Insufficient documentation

## 2016-04-22 DIAGNOSIS — Z8742 Personal history of other diseases of the female genital tract: Secondary | ICD-10-CM | POA: Diagnosis not present

## 2016-04-22 DIAGNOSIS — B9789 Other viral agents as the cause of diseases classified elsewhere: Secondary | ICD-10-CM

## 2016-04-22 DIAGNOSIS — R062 Wheezing: Secondary | ICD-10-CM

## 2016-04-22 DIAGNOSIS — J069 Acute upper respiratory infection, unspecified: Secondary | ICD-10-CM | POA: Insufficient documentation

## 2016-04-22 MED ORDER — BENZONATATE 100 MG PO CAPS: 100.0000 mg | ORAL_CAPSULE | Freq: Three times a day (TID) | ORAL | Status: DC

## 2016-04-22 MED ORDER — AEROCHAMBER PLUS FLO-VU MEDIUM MISC
1.0000 | Freq: Once | Status: AC
  Administered 2016-04-22: 1

## 2016-04-22 MED ORDER — IBUPROFEN 800 MG PO TABS
800.0000 mg | ORAL_TABLET | Freq: Once | ORAL | Status: AC
  Administered 2016-04-22: 800 mg via ORAL
  Filled 2016-04-22: qty 1

## 2016-04-22 MED ORDER — ALBUTEROL SULFATE (2.5 MG/3ML) 0.083% IN NEBU
5.0000 mg | INHALATION_SOLUTION | Freq: Once | RESPIRATORY_TRACT | Status: AC
  Administered 2016-04-22: 5 mg via RESPIRATORY_TRACT
  Filled 2016-04-22: qty 6

## 2016-04-22 MED ORDER — FLUTICASONE PROPIONATE 50 MCG/ACT NA SUSP: 2.0000 | Freq: Every day | NASAL | Status: DC

## 2016-04-22 MED ORDER — ALBUTEROL SULFATE HFA 108 (90 BASE) MCG/ACT IN AERS
2.0000 | INHALATION_SPRAY | RESPIRATORY_TRACT | Status: DC | PRN
  Administered 2016-04-22: 2 via RESPIRATORY_TRACT
  Filled 2016-04-22: qty 6.7

## 2016-04-22 NOTE — ED Provider Notes (Signed)
CSN: [id number]     Arrival date & time 04/22/16  0102 History   First MD Initiated Contact with Patient 04/22/16 0135     Chief Complaint  Patient presents with  . Cough     (Consider location/radiation/quality/duration/timing/severity/associated sxs/prior Treatment) The history is provided by the patient and medical records. No language interpreter was used.     Amy Snyder is a 25 y.o. female  with a hx of hypertension, BV presents to the Emergency Department complaining of gradual, persistent, progressively worsening URI symptoms onset 3 days ago. She has associated rhinorrhea, sore throat, nasal congestion,  postnasal drip, cough. She denies fever, chills, difficulty breathing, abdominal pain, nausea, vomiting. She reports taking Mucinex without relief. No aggravating or alleviating factors.  She reports sick contacts with similar symptoms at home including her son.   Past Medical History  Diagnosis Date  . Hypertension   . BV (bacterial vaginosis)    Past Surgical History  Procedure Laterality Date  . Cesarean section  Oct 21, 2009   Family History  Problem Relation Age of Onset  . Hypertension Mother   . Diabetes     Social History  Substance Use Topics  . Smoking status: Current Some Day Smoker -- 0.20 packs/day for 3 years    Types: Cigarettes  . Smokeless tobacco: Never Used     Comment: smokes socially  . Alcohol Use: 0.0 oz/week    0 Standard drinks or equivalent per week     Comment: socially   OB History    Gravida Para Term Preterm AB TAB SAB Ectopic Multiple Living   1 1  1      1      Review of Systems  Constitutional: Positive for fatigue. Negative for fever, chills and appetite change.  HENT: Positive for congestion, postnasal drip, rhinorrhea, sinus pressure and sore throat. Negative for ear discharge, ear pain and mouth sores.   Eyes: Negative for visual disturbance.  Respiratory: Positive for cough, chest tightness and wheezing. Negative for  shortness of breath and stridor.   Cardiovascular: Negative for chest pain, palpitations and leg swelling.  Gastrointestinal: Negative for nausea, vomiting, abdominal pain and diarrhea.  Genitourinary: Negative for dysuria, urgency, frequency and hematuria.  Musculoskeletal: Negative for myalgias, back pain, arthralgias and neck stiffness.  Skin: Negative for rash.  Neurological: Positive for headaches ( Intermittent, generalized and throbbing). Negative for syncope, light-headedness and numbness.  Hematological: Negative for adenopathy.  Psychiatric/Behavioral: The patient is not nervous/anxious.   All other systems reviewed and are negative.     Allergies  Flagyl  Home Medications   Prior to Admission medications   Medication Sig Start Date End Date Taking? Authorizing Provider  benzonatate (TESSALON) 100 MG capsule Take 1 capsule (100 mg total) by mouth every 8 (eight) hours. 04/22/16   Mekiah Wahler, PA-C  carvedilol (COREG) 6.25 MG tablet TAKE ONE TABLET BY MOUTH TWICE DAILY WITH MEALS 03/20/16   Shelly Bombard, MD  fluticasone [facility]) 50 MCG/ACT nasal spray Place 2 sprays into both nostrils daily. 04/22/16   Khyrin Trevathan, PA-C  triamterene-hydrochlorothiazide (DYAZIDE) 37.5-25 MG capsule TAKE ONE CAPSULE BY MOUTH ONCE DAILY 03/20/16   Shelly Bombard, MD   BP 147/93 mmHg  Pulse 85  Temp(Src) 99 F (37.2 C) (Oral)  Resp 16  Ht 5\' 7"  (1.702 m)  Wt 77.111 kg  BMI 26.62 kg/m2  SpO2 99%  LMP 03/28/2016 (Exact Date) Physical Exam  Constitutional: She is oriented to person, place, and time.  She appears well-developed and well-nourished. No distress.  HENT:  Head: Normocephalic and atraumatic.  Right Ear: Tympanic membrane, external ear and ear canal normal.  Left Ear: Tympanic membrane, external ear and ear canal normal.  Nose: Mucosal edema and rhinorrhea present. No epistaxis. Right sinus exhibits no maxillary sinus tenderness and no frontal sinus tenderness. Left  sinus exhibits no maxillary sinus tenderness and no frontal sinus tenderness.  Mouth/Throat: Uvula is midline and mucous membranes are normal. Mucous membranes are not pale and not cyanotic. No oropharyngeal exudate, posterior oropharyngeal edema, posterior oropharyngeal erythema or tonsillar abscesses.  Eyes: Conjunctivae are normal. Pupils are equal, round, and reactive to light.  Neck: Normal range of motion and full passive range of motion without pain.  Cardiovascular: Normal rate and intact distal pulses.   Pulmonary/Chest: Effort normal. No accessory muscle usage or stridor. No tachypnea. No respiratory distress. She has decreased breath sounds. She has wheezes.  Equal chest rise Decreased breath sounds with mild expiratory wheezes throughout No respiratory distress or accessory muscle usage  Abdominal: Soft. Bowel sounds are normal. There is no tenderness.  Musculoskeletal: Normal range of motion.  Lymphadenopathy:    She has no cervical adenopathy.  Neurological: She is alert and oriented to person, place, and time.  Skin: Skin is warm and dry. No rash noted. She is not diaphoretic.  Psychiatric: She has a normal mood and affect.  Nursing note and vitals reviewed.   ED Course  Procedures (including critical care time)   MDM   Final diagnoses:  Viral URI with cough  Wheezing  Essential hypertension   Amy Snyder presents with URI symptoms.  Pt afebrile and without focal breath sounds. No indication for chest x-ray. Patients symptoms are consistent with URI, likely viral etiology. She is wheezing. Albuterol given with resolution of wheezing. Likely bronchospasm due to bronchitis. Discussed that antibiotics are not indicated for viral infections. Pt will be discharged with symptomatic treatment.  Verbalizes understanding and is agreeable with plan. Pt is hemodynamically stable & in NAD prior to dc.    Jarrett Soho Madyx Delfin, PA-C 04/22/16 [place],  MD 04/25/16 [phone]

## 2016-04-22 NOTE — Discharge Instructions (Signed)
1. Medications: flonase, mucinex, tessalon, albuterol, usual home medications °2. Treatment: rest, drink plenty of fluids, take tylenol or ibuprofen for fever control °3. Follow Up: Please followup with your primary doctor in 3 days for discussion of your diagnoses and further evaluation after today's visit; if you do not have a primary care doctor use the resource guide provided to find one; Return to the ER for high fevers, difficulty breathing or other concerning symptoms ° °

## 2016-04-22 NOTE — ED Notes (Signed)
Pt states she has had a cough for the past 3 days and it is progressively getting worse  Pt states her ribs are sore from coughing so much

## 2016-04-26 ENCOUNTER — Ambulatory Visit: Payer: Medicaid Other | Admitting: Obstetrics and Gynecology

## 2016-05-31 ENCOUNTER — Ambulatory Visit: Payer: Medicaid Other | Admitting: Obstetrics and Gynecology

## 2016-05-31 ENCOUNTER — Encounter: Payer: Self-pay | Admitting: Obstetrics and Gynecology

## 2016-05-31 ENCOUNTER — Ambulatory Visit (INDEPENDENT_AMBULATORY_CARE_PROVIDER_SITE_OTHER): Payer: Medicaid Other | Admitting: Obstetrics and Gynecology

## 2016-05-31 ENCOUNTER — Other Ambulatory Visit (HOSPITAL_COMMUNITY)
Admission: RE | Admit: 2016-05-31 | Discharge: 2016-05-31 | Disposition: A | Discharge status: Home / Self Care | Payer: Medicaid Other | Source: Ambulatory Visit | Attending: Family Medicine | Admitting: Family Medicine

## 2016-05-31 VITALS — BP 155/95 | HR 91 | Temp 98.2°F | Wt 179.0 lb

## 2016-05-31 DIAGNOSIS — B9689 Other specified bacterial agents as the cause of diseases classified elsewhere: Secondary | ICD-10-CM

## 2016-05-31 DIAGNOSIS — A499 Bacterial infection, unspecified: Secondary | ICD-10-CM

## 2016-05-31 DIAGNOSIS — D171 Benign lipomatous neoplasm of skin and subcutaneous tissue of trunk: Secondary | ICD-10-CM

## 2016-05-31 DIAGNOSIS — N760 Acute vaginitis: Secondary | ICD-10-CM | POA: Diagnosis not present

## 2016-05-31 DIAGNOSIS — Z113 Encounter for screening for infections with a predominantly sexual mode of transmission: Secondary | ICD-10-CM | POA: Diagnosis present

## 2016-05-31 DIAGNOSIS — I10 Essential (primary) hypertension: Secondary | ICD-10-CM

## 2016-05-31 DIAGNOSIS — N898 Other specified noninflammatory disorders of vagina: Secondary | ICD-10-CM

## 2016-05-31 LAB — POCT WET PREP (WET MOUNT): Clue Cells Wet Prep Whiff POC: POSITIVE

## 2016-05-31 MED ORDER — HYDROCHLOROTHIAZIDE 25 MG PO TABS: 25.0000 mg | ORAL_TABLET | Freq: Every day | ORAL | Status: DC

## 2016-05-31 MED ORDER — CLINDAMYCIN HCL 300 MG PO CAPS: 300.0000 mg | ORAL_CAPSULE | Freq: Two times a day (BID) | ORAL | Status: DC

## 2016-05-31 MED ORDER — CARVEDILOL 3.125 MG PO TABS: 6.2500 mg | ORAL_TABLET | Freq: Two times a day (BID) | ORAL | Status: DC

## 2016-05-31 NOTE — Patient Instructions (Signed)
Here are some of the things we discussed today: -will call you about results of wet prep -changed blood pressure medications -referral placed for surgery. Will get a call to schedule  Please schedule a follow-up appointment for 4 weeks   Thanks for allowing me to be a part of your care! Dr. Gerarda Fraction   Lipoma A lipoma is a noncancerous (benign) tumor that is made up of fat cells. This is a very common type of soft-tissue growth. Lipomas are usually found under the skin (subcutaneous). They may occur in any tissue of the body that contains fat. Common areas for lipomas to appear include the back, shoulders, buttocks, and thighs. Lipomas grow slowly, and they are usually painless. Most lipomas do not cause problems and do not require treatment. CAUSES The cause of this condition is not known. RISK FACTORS This condition is more likely to develop in:  People who are 45-9 years old.  People who have a family history of lipomas. SYMPTOMS A lipoma usually appears as a small, round bump under the skin. It may feel soft or rubbery, but the firmness can vary. Most lipomas are not painful. However, a lipoma may become painful if it is located in an area where it pushes on nerves. DIAGNOSIS A lipoma can usually be diagnosed with a physical exam. You may also have tests to confirm the diagnosis and to rule out other conditions. Tests may include:  Imaging tests, such as a CT scan or MRI.  Removal of a tissue sample to be looked at under a microscope (biopsy). TREATMENT Treatment is not needed for small lipomas that are not causing problems. If a lipoma continues to get bigger or it causes problems, removal is often the best option. Lipomas can also be removed to improve appearance. Removal of a lipoma is usually done with a surgery in which the fatty cells and the surrounding capsule are removed. Most often, a medicine that numbs the area (local anesthetic) is used for this procedure. HOME CARE  INSTRUCTIONS  Keep all follow-up visits as directed by your health care provider. This is important. SEEK MEDICAL CARE IF:  Your lipoma becomes larger or hard.  Your lipoma becomes painful, red, or increasingly swollen. These could be signs of infection or a more serious condition.   This information is not intended to replace advice given to you by your health care provider. Make sure you discuss any questions you have with your health care provider.   Document Released: 11/26/2002 Document Revised: 04/22/2015 Document Reviewed: 12/02/2014 Elsevier Interactive Patient Education [employer].

## 2016-05-31 NOTE — Progress Notes (Signed)
Subjective: Chief Complaint  Patient presents with  . Vaginal Discharge     HPI: Amy Snyder is a 25 y.o. presenting to clinic today to discuss the following:  #Vaginal Discharge Having vaginal discharge for a few weeks Medications tried: none Recent antibiotic use: no Sex in last month: yes Possible STD exposure: yes Believes she has BV again Mild odor and copious thin discharge that is slightly colored  Symptoms Fever: no Dysuria: no Abdomen or Pelvic pain: no  #Hypertension Blood pressure at home: Does not monitor Blood pressure today: elevated Taking Meds: Yes Started on medications by OBGYN provider Side effects: none ROS: Denies headache, dizziness, visual changes, nausea, vomiting, chest pain, abdominal pain or shortness of breath.  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  Past Medical, Surgical, Social, and Family History Reviewed & Updated per EMR. Smoking status - Current smoker   Objective: BP 155/95 mmHg  Pulse 91  Temp(Src) 98.2 F (36.8 C) (Oral)  Wt 179 lb (81.194 kg)  SpO2 100%  LMP 04/27/2016 Vitals and nursing notes reviewed  Physical Exam  Constitutional: She is well-developed, well-nourished, and in no distress.  Cardiovascular: Normal rate, regular rhythm and normal heart sounds.   Pulmonary/Chest: Effort normal and breath sounds normal.  Abdominal: Soft. Bowel sounds are normal. She exhibits mass. She exhibits no distension. There is no tenderness.  Soft 5cm mass appreciated in RLQ of abdomen.   Genitourinary: Vaginal discharge found.    Results for orders placed or performed in visit on 05/31/16 (from the past 72  hour(s))  POCT Wet Prep [facility] [place])     Status: Abnormal   Collection Time: 05/31/16  4:47 PM  Result Value Ref Range   Source Wet Prep POC VAG    WBC, Wet Prep HPF POC 5-10    Bacteria Wet Prep HPF POC Moderate (A) None, Few, Too numerous to count   Clue Cells Wet Prep HPF POC Moderate (A) None, Too numerous to count   Clue Cells Wet Prep Whiff POC Positive Whiff    Yeast Wet Prep HPF POC None    Trichomonas Wet Prep HPF POC NONE     Assessment/Plan: Please see problem based Assessment and Plan   Orders Placed This Encounter  Procedures  . Ambulatory referral to General Surgery    Referral Priority:  Routine    Referral Type:  Surgical    Referral Reason:  Specialty Services Required    Requested Specialty:  General Surgery    Number of Visits Requested:  1  . POCT Wet Prep [facility])    Meds ordered this encounter  Medications  . hydrochlorothiazide (HYDRODIURIL) 25 MG tablet    Sig: Take 1 tablet (25 mg total) by mouth daily.    Dispense:  30 tablet    Refill:  1  . carvedilol (COREG) 3.125 MG tablet    Sig: Take 2 tablets (6.25 mg total) by mouth 2 (two) times daily with a meal.    Dispense:  30 tablet    Refill:  0  . clindamycin (CLEOCIN) 300 MG capsule    Sig: Take 1 capsule (300 mg total) by mouth 2 (  two) times daily.    Dispense:  14 capsule    Refill:  [place], DO 05/31/2016, 4:52 PM PGY-2, [place]

## 2016-06-01 DIAGNOSIS — N898 Other specified noninflammatory disorders of vagina: Secondary | ICD-10-CM | POA: Insufficient documentation

## 2016-06-01 NOTE — Assessment & Plan Note (Signed)
Wet prep positive for clue cells and whiff test. Intolerance to flagyl. Rx for clindamycin given.

## 2016-06-01 NOTE — Assessment & Plan Note (Signed)
Long-standing HTN. BPs elevated today x2. Will adjust medications as follows: Decrease coreg dose to 3.125 BID from 6.25 (eventually to discontinue). HCTZ 25mg  ordered. Discontinue dyazide. Will likely need addition of another agent. Follow-up in nurse clinic for BP recheck in about a week. Follow-up with me in clinic in 4 weeks.

## 2016-06-01 NOTE — Assessment & Plan Note (Signed)
Long-standing lipoma that is stable. No red flags. Asymptomatic currently. Will place referral back to [facility].

## 2016-06-01 NOTE — Assessment & Plan Note (Addendum)
Vaginal discharge on exam. Will collect wet prep and Gc/Ch. Counseled on safe sex practices.

## 2016-06-02 ENCOUNTER — Ambulatory Visit: Payer: Medicaid Other | Admitting: Student

## 2016-06-02 LAB — CERVICOVAGINAL ANCILLARY ONLY
Chlamydia: NEGATIVE
Neisseria Gonorrhea: NEGATIVE

## 2016-06-16 ENCOUNTER — Other Ambulatory Visit: Payer: Self-pay | Admitting: General Surgery

## 2016-06-16 DIAGNOSIS — R1909 Other intra-abdominal and pelvic swelling, mass and lump: Secondary | ICD-10-CM

## 2016-06-25 ENCOUNTER — Encounter: Payer: Medicaid Other | Admitting: Obstetrics and Gynecology

## 2016-06-29 ENCOUNTER — Other Ambulatory Visit: Payer: Medicaid Other

## 2016-07-06 ENCOUNTER — Ambulatory Visit
Admission: RE | Admit: 2016-07-06 | Discharge: 2016-07-06 | Disposition: A | Discharge status: Home / Self Care | Payer: Medicaid Other | Source: Ambulatory Visit | Attending: General Surgery | Admitting: General Surgery

## 2016-07-06 ENCOUNTER — Other Ambulatory Visit: Payer: Self-pay | Admitting: General Surgery

## 2016-07-06 DIAGNOSIS — R1909 Other intra-abdominal and pelvic swelling, mass and lump: Secondary | ICD-10-CM

## 2016-07-08 ENCOUNTER — Other Ambulatory Visit: Payer: Self-pay | Admitting: Obstetrics and Gynecology

## 2016-07-08 NOTE — Telephone Encounter (Signed)
Pt called because she needs a refill on both of her blood pressure medications. jw

## 2016-07-09 MED ORDER — HYDROCHLOROTHIAZIDE 25 MG PO TABS: 25.0000 mg | ORAL_TABLET | Freq: Every day | ORAL | Status: DC

## 2016-07-09 MED ORDER — CARVEDILOL 3.125 MG PO TABS: 6.2500 mg | ORAL_TABLET | Freq: Two times a day (BID) | ORAL | Status: DC

## 2016-07-20 ENCOUNTER — Ambulatory Visit: Payer: Medicaid Other | Admitting: Obstetrics and Gynecology

## 2016-07-27 ENCOUNTER — Ambulatory Visit: Payer: Medicaid Other | Admitting: Family Medicine

## 2016-08-10 ENCOUNTER — Other Ambulatory Visit: Payer: Self-pay | Admitting: Obstetrics and Gynecology

## 2016-08-10 ENCOUNTER — Ambulatory Visit: Payer: Medicaid Other | Admitting: Obstetrics and Gynecology

## 2016-08-10 NOTE — Telephone Encounter (Signed)
Pt needs a refill on both of here BP medications. She was suppose to come for an appointment but the doctor was out with a sick child. Can we call these in. jw

## 2016-08-11 MED ORDER — HYDROCHLOROTHIAZIDE 25 MG PO TABS: 25.0000 mg | ORAL_TABLET | Freq: Every day | ORAL | 1 refills | Status: DC

## 2016-08-11 MED ORDER — CARVEDILOL 3.125 MG PO TABS: 6.2500 mg | ORAL_TABLET | Freq: Two times a day (BID) | ORAL | 1 refills | Status: DC

## 2016-08-19 ENCOUNTER — Encounter: Payer: Self-pay | Admitting: Obstetrics and Gynecology

## 2016-08-19 ENCOUNTER — Other Ambulatory Visit (HOSPITAL_COMMUNITY)
Admission: RE | Admit: 2016-08-19 | Discharge: 2016-08-19 | Disposition: A | Discharge status: Home / Self Care | Payer: Medicaid Other | Source: Ambulatory Visit | Attending: Family Medicine | Admitting: Family Medicine

## 2016-08-19 ENCOUNTER — Ambulatory Visit (INDEPENDENT_AMBULATORY_CARE_PROVIDER_SITE_OTHER): Payer: Medicaid Other | Admitting: Obstetrics and Gynecology

## 2016-08-19 VITALS — BP 147/88 | HR 63 | Temp 98.4°F

## 2016-08-19 DIAGNOSIS — I10 Essential (primary) hypertension: Secondary | ICD-10-CM

## 2016-08-19 DIAGNOSIS — Z113 Encounter for screening for infections with a predominantly sexual mode of transmission: Secondary | ICD-10-CM | POA: Diagnosis not present

## 2016-08-19 DIAGNOSIS — Z202 Contact with and (suspected) exposure to infections with a predominantly sexual mode of transmission: Secondary | ICD-10-CM | POA: Diagnosis not present

## 2016-08-19 DIAGNOSIS — N898 Other specified noninflammatory disorders of vagina: Secondary | ICD-10-CM

## 2016-08-19 LAB — POCT WET PREP (WET MOUNT)
CLUE CELLS WET PREP WHIFF POC: NEGATIVE
Trichomonas Wet Prep HPF POC: ABSENT

## 2016-08-19 MED ORDER — HYDROCHLOROTHIAZIDE 25 MG PO TABS: 25.0000 mg | ORAL_TABLET | Freq: Every day | ORAL | 3 refills | Status: DC

## 2016-08-19 MED ORDER — CARVEDILOL 3.125 MG PO TABS: 6.2500 mg | ORAL_TABLET | Freq: Two times a day (BID) | ORAL | 3 refills | Status: DC

## 2016-08-19 NOTE — Progress Notes (Signed)
Subjective: Chief Complaint  Patient presents with  . Med refills    BP meds  . std testing    HPI: Amy Snyder is a 25 y.o. presenting to clinic today to discuss the following:  #Vaginal Discharge Patient states that she's had an increased amount of discharge. Has had BV before and thinks that she has again. Denies any odor. Had unprotected sex about a month ago. Denies any itching, burning, pelvic pain. Also like STD testing. Endorses white discharge. Discharge was present for a little over 2 weeks.  #HTN Patient needs refill on blood pressure medications. Has been out of medications and was waiting to this appointment to get refills. States that when she has medication she is compliant. Blood pressure elevated today. ROS: Denies headache, dizziness, visual changes, nausea, vomiting, chest pain, abdominal pain or shortness of breath.  ROS noted in HPI.  Past Medical, Surgical, Social, and Family History Reviewed & Updated per EMR. Smoking status - current some day smoker   Objective: BP (!) 147/88   Pulse 63   Temp 98.4 F (36.9 C) (Oral)  Vitals and nursing notes reviewed  Physical Exam Constitutional: She is well-developed, well-nourished, and in no distress.  Cardiovascular: Normal rate, regular rhythm and normal heart sounds.   Pulmonary/Chest: Effort normal and breath sounds normal.  Abdominal: Soft. Non-tender.  Genitourinary: Vaginal discharge found.   Results for orders placed or performed in visit on 08/19/16 (from the past 72 hour(s))  HIV antibody (with reflex)     Status: None   Collection Time: 08/19/16  4:09 PM  Result Value Ref Range   HIV 1&2 Ab, 4th Generation NONREACTIVE NONREACTIVE    Comment:   HIV-1 antigen and HIV-1/HIV-2 antibodies were not detected.  There is no laboratory evidence of HIV infection.   HIV-1/2 Antibody Diff        Not indicated. HIV-1 RNA, Qual TMA          Not indicated.     PLEASE NOTE: This information has been  disclosed to you from records whose confidentiality may be protected by state law. If your state requires such protection, then the state law prohibits you from making any further disclosure of the information without the specific written consent of the person to whom it pertains, or as otherwise permitted by law. A general authorization for the release of medical or other information is NOT sufficient for this purpose.   The performance of this assay has not been clinically validated in patients less than 25 years old.   For additional information please refer to http://education.questdiagnostics.com/faq/FAQ106.  (This link is being provided for informational/educational purposes only.)     RPR     Status: None   Collection Time: 08/19/16  4:09 PM  Result Value Ref Range   RPR Ser Ql NON REAC NON REAC  POCT Wet Prep [facility] [place])     Status: Abnormal   Collection Time: 08/19/16  4:30 PM  Result Value Ref Range   Source Wet Prep POC VAG    WBC, Wet Prep HPF POC 1-5    Bacteria Wet Prep HPF POC Few None, Few, Too numerous to count   Clue Cells Wet Prep HPF POC Few (A) None, Too numerous to count   Clue Cells Wet Prep Whiff POC Negative Whiff    Yeast Wet Prep HPF POC None    Trichomonas Wet Prep HPF POC Absent Absent    Assessment/Plan: Please see problem based Assessment and Plan  Orders Placed This Encounter  Procedures  . HIV antibody (with reflex)  . RPR  . POCT Wet Prep [facility])    Meds ordered this encounter  Medications  . hydrochlorothiazide (HYDRODIURIL) 25 MG tablet    Sig: Take 1 tablet (25 mg total) by mouth daily.    Dispense:  60 tablet    Refill:  3  . carvedilol (COREG) 3.125 MG tablet    Sig: Take 2 tablets (6.25 mg total) by mouth 2 (two) times daily with a meal.    Dispense:  120 tablet    Refill:  [place], DO 08/19/2016, 10:49 AM PGY-3, [place]

## 2016-08-19 NOTE — Patient Instructions (Addendum)
Will contact you about the results of your labs today We discussed possible suppression therapy for recurrent BV Refilled her blood pressure medications

## 2016-08-20 LAB — HIV ANTIBODY (ROUTINE TESTING W REFLEX): HIV 1&2 Ab, 4th Generation: NONREACTIVE

## 2016-08-20 LAB — GC/CHLAMYDIA PROBE AMP (~~LOC~~) NOT AT ARMC
Chlamydia: NEGATIVE
Neisseria Gonorrhea: NEGATIVE

## 2016-08-20 LAB — RPR

## 2016-08-20 NOTE — Assessment & Plan Note (Signed)
Long-standing uncontrolled hypertension. Blood pressure elevated 2 today. Patient has been on a medication. Refill given for HCTZ and Coreg. We'll continue to decrease Coreg to eventually discontinue as patient has no indication for beta blocker. Will likely end up needing an additional blood pressure agents. Consider ACE/ARB.

## 2016-08-24 NOTE — Assessment & Plan Note (Signed)
Having unprotected intercourse. Full STD testing given. Wet prep performed without signs of infection. Counseled on safe sex practices.

## 2016-08-25 ENCOUNTER — Encounter (HOSPITAL_COMMUNITY): Payer: Self-pay | Admitting: Emergency Medicine

## 2016-08-25 ENCOUNTER — Emergency Department (HOSPITAL_COMMUNITY)
Admission: EM | Admit: 2016-08-25 | Discharge: 2016-08-25 | Disposition: A | Discharge status: Home / Self Care | Payer: Medicaid Other | Attending: Emergency Medicine | Admitting: Emergency Medicine

## 2016-08-25 DIAGNOSIS — Y929 Unspecified place or not applicable: Secondary | ICD-10-CM | POA: Diagnosis not present

## 2016-08-25 DIAGNOSIS — S161XXA Strain of muscle, fascia and tendon at neck level, initial encounter: Secondary | ICD-10-CM | POA: Diagnosis not present

## 2016-08-25 DIAGNOSIS — S199XXA Unspecified injury of neck, initial encounter: Secondary | ICD-10-CM | POA: Diagnosis present

## 2016-08-25 DIAGNOSIS — Y939 Activity, unspecified: Secondary | ICD-10-CM | POA: Insufficient documentation

## 2016-08-25 DIAGNOSIS — X58XXXA Exposure to other specified factors, initial encounter: Secondary | ICD-10-CM | POA: Insufficient documentation

## 2016-08-25 DIAGNOSIS — F17210 Nicotine dependence, cigarettes, uncomplicated: Secondary | ICD-10-CM | POA: Insufficient documentation

## 2016-08-25 DIAGNOSIS — Y999 Unspecified external cause status: Secondary | ICD-10-CM | POA: Insufficient documentation

## 2016-08-25 DIAGNOSIS — I10 Essential (primary) hypertension: Secondary | ICD-10-CM

## 2016-08-25 MED ORDER — TRAMADOL HCL 50 MG PO TABS
50.0000 mg | ORAL_TABLET | Freq: Once | ORAL | Status: AC
  Administered 2016-08-25: 50 mg via ORAL
  Filled 2016-08-25: qty 1

## 2016-08-25 MED ORDER — IBUPROFEN 800 MG PO TABS: 800.0000 mg | ORAL_TABLET | Freq: Three times a day (TID) | ORAL | 0 refills | Status: DC

## 2016-08-25 MED ORDER — CYCLOBENZAPRINE HCL 10 MG PO TABS: 10.0000 mg | ORAL_TABLET | Freq: Two times a day (BID) | ORAL | 0 refills | Status: DC | PRN

## 2016-08-25 MED ORDER — TRAMADOL HCL 50 MG PO TABS: 50.0000 mg | ORAL_TABLET | Freq: Four times a day (QID) | ORAL | 0 refills | Status: DC | PRN

## 2016-08-25 MED ORDER — CYCLOBENZAPRINE HCL 10 MG PO TABS
10.0000 mg | ORAL_TABLET | Freq: Once | ORAL | Status: AC
  Administered 2016-08-25: 10 mg via ORAL
  Filled 2016-08-25: qty 1

## 2016-08-25 MED ORDER — IBUPROFEN 800 MG PO TABS
800.0000 mg | ORAL_TABLET | Freq: Once | ORAL | Status: AC
  Administered 2016-08-25: 800 mg via ORAL
  Filled 2016-08-25: qty 1

## 2016-08-25 NOTE — ED Notes (Signed)
MD at bedside. 

## 2016-08-25 NOTE — ED Provider Notes (Signed)
[facility] DEPT Provider Note   CSN: [id number] Arrival date & time: 08/25/16  1703     History   Chief Complaint Chief Complaint  Patient presents with  . Neck Pain    HPI Amy Snyder is a 25 y.o. female.  HPI   Patient is a 25 year old female with history of hypertension, she presents emergency Department complaining of right posterior neck muscle pain which radiates up into her head, described as spasm, tightness, rated 10/10, worse with palpation and movement.  She has associated limited rotation of her head to the right, and a right sided headache described as tension and and pounding.  She denies any injury or strain of neck, acute or remote.  Pain began two nights ago and has gradually worsened.  She denies back pain, photophobia, blurry vision, sore throat, right shoulder pain, numbness, weakness.  The neck pain and headache. The patient and she was concerned that it may be related to her high blood pressure, and she has not been taking her medicines which are 25 mg of HCTZ and carvedilol.  She took a dose of her medicines last night. She denies chest pain, shortness of breath, near-syncope, palpitations, extremity edema, orthopnea.  No fever, chills, sweats, nausea, vomiting.   Past Medical History:  Diagnosis Date  . BV (bacterial vaginosis)   . Hypertension     Patient Active Problem List   Diagnosis Date Noted  . Vaginal discharge 06/01/2016  . Essential hypertension, benign 07/03/2014  . 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 Moderate dysplasia of cervix 02/06/2014  . Papanicolaou smear of cervix with low grade squamous intraepithelial lesion (LGSIL) 01/21/2014  . 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 Excessive or frequent menstruation 08/02/2013    Past Surgical History:  Procedure Laterality Date  . CESAREAN SECTION  Oct 21, 2009    OB History    Gravida Para Term Preterm AB Living   1 1   1   1    SAB TAB Ectopic Multiple  Live Births           1       Home Medications    Prior to Admission medications   Medication Sig Start Date End Date Taking? Authorizing Provider  carvedilol (COREG) 3.125 MG tablet Take 2 tablets (6.25 mg total) by mouth 2 (two) times daily with a meal. 08/19/16  Yes Katheren Shams, DO  clindamycin (CLEOCIN) 300 MG capsule Take 1 capsule (300 mg total) by mouth 2 (two) times daily. 05/31/16  Yes Katheren Shams, DO  hydrochlorothiazide (HYDRODIURIL) 25 MG tablet Take 1 tablet (25 mg total) by mouth daily. 08/19/16  Yes Katheren Shams, DO  benzonatate (TESSALON) 100 MG capsule Take 1 capsule (100 mg total) by mouth every 8 (eight) hours. Patient not taking: Reported on 08/25/2016 04/22/16   Jarrett Soho Muthersbaugh, PA-C  cyclobenzaprine (FLEXERIL) 10 MG tablet Take 1 tablet (10 mg total) by mouth 2 (two) times daily as needed for muscle spasms. 08/25/16   Delsa Grana, PA-C  fluticasone (FLONASE) 50 MCG/ACT nasal spray Place 2 sprays into both nostrils daily. Patient not taking: Reported on 08/25/2016 04/22/16   Jarrett Soho Muthersbaugh, PA-C  ibuprofen (ADVIL,MOTRIN) 800 MG tablet Take 1 tablet (800 mg total) by mouth 3 (three) times daily. 08/25/16   Delsa Grana, PA-C  traMADol (ULTRAM) 50 MG tablet Take 1 tablet (50 mg total) by mouth every 6 (six) hours as needed. 08/25/16   Delsa Grana, PA-C    Family  History Family History  Problem Relation Age of Onset  . Hypertension Mother   . Diabetes      Social History Social History  Substance Use Topics  . Smoking status: Current Some Day Smoker    Packs/day: 0.20    Years: 3.00    Types: Cigarettes  . Smokeless tobacco: Never Used     Comment: smokes socially  . Alcohol use 0.0 oz/week     Comment: socially     Allergies   Flagyl [metronidazole]   Review of Systems Review of Systems  All other systems reviewed and are negative.    Physical Exam Updated Vital Signs BP (!) 170/120   Pulse 69   Temp 98.2 F (36.8 C)   Resp 18   LMP  07/29/2016   SpO2 100%   Physical Exam  Constitutional: She is oriented to person, place, and time. She appears well-developed and well-nourished. No distress.  HENT:  Head: Normocephalic and atraumatic.  Right Ear: External ear normal.  Left Ear: External ear normal.  Nose: Nose normal.  Mouth/Throat: Oropharynx is clear and moist. No oropharyngeal exudate.  Eyes: Conjunctivae, EOM and lids are normal. Pupils are equal, round, and reactive to light. Right eye exhibits no discharge. Left eye exhibits no discharge. No scleral icterus.  Neck: Normal range of motion and phonation normal. Neck supple. No JVD present. Muscular tenderness present. No spinous process tenderness present. No neck rigidity. No tracheal deviation, no edema, no erythema and normal range of motion present. No Brudzinski's sign and no Kernig's sign noted.    No midline tenderness of cervical spine, right cervical paraspinal tenderness from occiput to right trapezius, no spasm palpated Minimally decreased rotation of neck to right when compared to left, able to rotate ~60 degrees  Cardiovascular: Normal rate, regular rhythm, normal heart sounds and intact distal pulses.  Exam reveals no gallop and no friction rub.   No murmur heard. Pulmonary/Chest: Effort normal and breath sounds normal. No stridor. No respiratory distress. She has no wheezes. She exhibits no tenderness.  Abdominal: Soft. She exhibits no distension.  Musculoskeletal: Normal range of motion. She exhibits tenderness. She exhibits no edema.       Right shoulder: Normal.       Thoracic back: Normal. She exhibits normal range of motion, no tenderness, no bony tenderness and no swelling.  Lymphadenopathy:    She has no cervical adenopathy.  Neurological: She is alert and oriented to person, place, and time. She has normal strength. She displays no tremor. No cranial nerve deficit or sensory deficit. She exhibits normal muscle tone. Coordination normal.  Skin:  Skin is warm and dry. Capillary refill takes less than 2 seconds. No rash noted. She is not diaphoretic. No erythema. No pallor.  Psychiatric: She has a normal mood and affect. Her behavior is normal. Judgment and thought content normal.  Nursing note and vitals reviewed.    ED Treatments / Results  Labs (all labs ordered are listed, but only abnormal results are displayed) Labs Reviewed - No data to display  EKG  EKG Interpretation None       Radiology No results found.  Procedures Procedures (including critical care time)  Medications Ordered in ED Medications  cyclobenzaprine (FLEXERIL) tablet 10 mg (10 mg Oral Given 08/25/16 2234)  ibuprofen (ADVIL,MOTRIN) tablet 800 mg (800 mg Oral Given 08/25/16 2234)  traMADol (ULTRAM) tablet 50 mg (50 mg Oral Given 08/25/16 2234)     Initial Impression / Assessment and Plan /  ED Course  I have reviewed the triage vital signs and the nursing notes.  Pertinent labs & imaging results that were available during my care of the patient were reviewed by me and considered in my medical decision making (see chart for details).  Clinical Course  Patient with right posterior neck pain and tightness 2 days Patient well-appearing, afebrile, denies any injury tonight, no fever, chills, sweats.  She has grossly normal range of motion of her neck, no midline tenderness, tender to palpation in right cervical paraspinal muscles without spasm palpated.  Patient has associated right sided headache gradual onset since the pain began.  She is neurovascularly intact, no vertigo. Neck pain is muscular in nature, reproducible, suspect mild cervical strain.  No concern for meningitis,   Patient is concerned about her elevated blood pressure, however she has not been compliant with her medications.  She is visibly upset and intermittently tearful. She does report pain 10 out of 10, and she is hypertensive in the ER however I suspect this is likely elevated secondary  to pain and anxiety.  She was encouraged to be compliant with her medications and return to her PCP for blood pressure recheck.  No evidence of end organ damage, she denies chest pain, shortness of breath, near syncope, abdominal pain.  Will treat with muscle relaxers, NSAIDs and pain medication. Again encouraged the patient to follow-up with her PCP for reevaluation of her blood pressure when she is not in acute pain. Patient treated in the ER, patient discharged home in good condition.  Patient will take her home dose of antihypertensives.  Final Clinical Impressions(s) / ED Diagnoses   Final diagnoses:  Cervical strain, acute, initial encounter  Essential hypertension    New Prescriptions Discharge Medication List as of 08/25/2016 11:01 PM    START taking these medications   Details  cyclobenzaprine (FLEXERIL) 10 MG tablet Take 1 tablet (10 mg total) by mouth 2 (two) times daily as needed for muscle spasms., Starting Wed 08/25/2016, Print    ibuprofen (ADVIL,MOTRIN) 800 MG tablet Take 1 tablet (800 mg total) by mouth 3 (three) times daily., Starting Wed 08/25/2016, Print    traMADol (ULTRAM) 50 MG tablet Take 1 tablet (50 mg total) by mouth every 6 (six) hours as needed., Starting Wed 08/25/2016, Print         Delsa Grana, PA-C 08/25/16 [place], MD 08/27/16 2159

## 2016-08-25 NOTE — ED Triage Notes (Signed)
Pt states "two nights ago the right side of my neck started hurting and now it travels up to my head. I dont know if its because of my blood pressure." Pt states she just started taking her BP medicine last night. Denies injury. Pt hypertensive in triage.

## 2016-08-27 ENCOUNTER — Ambulatory Visit (INDEPENDENT_AMBULATORY_CARE_PROVIDER_SITE_OTHER): Payer: Medicaid Other | Admitting: Internal Medicine

## 2016-08-27 VITALS — BP 140/100 | HR 69 | Temp 98.2°F

## 2016-08-27 DIAGNOSIS — G44209 Tension-type headache, unspecified, not intractable: Secondary | ICD-10-CM

## 2016-08-27 DIAGNOSIS — R519 Headache, unspecified: Secondary | ICD-10-CM | POA: Insufficient documentation

## 2016-08-27 DIAGNOSIS — R51 Headache: Secondary | ICD-10-CM | POA: Diagnosis not present

## 2016-08-27 MED ORDER — MELATONIN 200 MCG PO TABS: 1.0000 | ORAL_TABLET | Freq: Every day | ORAL | 0 refills | Status: DC | PRN

## 2016-08-27 MED ORDER — KETOROLAC TROMETHAMINE 30 MG/ML IJ SOLN
30.0000 mg | Freq: Once | INTRAMUSCULAR | Status: AC
  Administered 2016-08-27: 30 mg via INTRAMUSCULAR

## 2016-08-27 NOTE — Progress Notes (Signed)
   [facility] Kerrin Mo, MD Phone: [phone]  Reason For Visit: SDA Headache  Amy Snyder is a 25 y.o. female who complains of headache. Patient initially had neck pain, while laying down. On Wednesday pain traveled up into head. Patient has had headache for about 3 days now. Description of pain: sharp pain, unilateral in the right frontal area. Has a hx of headaches in the past which have been previously relieved by ibuprofen.   Patient states gets headaches about once a month. Associated symptoms include photophobia.  Denies symptoms of  aura, dizziness, facial pain, fever, flashing lights, loss of vision, motor impairment, vomiting and weakness. Pain relief: ibuprofen. Precipitating factors: patient is aware of none. She denies a history of recent head injury.  Prior neurological history: negative for no neurological problems. Neurologic Review of Systems - no TIA or stroke-like symptoms, no amaurosis, diplopia, abnormal speech, unilateral numbness or weakness.    Objective: BP (!) 140/100   Pulse 69   Temp 98.2 F (36.8 C)   LMP 07/29/2016   SpO2 100%   Vitals:   08/27/16 1500  BP: (!) 140/100  Pulse: 69  Temp: 98.2 F (36.8 C)   Appearance: alert, well appearing, and in no distress. Neurological Exam: alert, oriented, normal speech, no focal findings or movement disorder noted, screening mental status exam normal, neck supple without rigidity, cranial nerves II through XII intact, DTR's normal and symmetric, motor and sensory grossly normal bilaterally, normal muscle tone, no tremors, strength 5/5.   Assessment/Plan: See problem based a/p  Headache Tension headache vs Migraines. Patient with hx of headaches, generally controlled with Ibuprofen. Possibly exacerbated by musculoskeletal pain, patient recent seen for muscle spasm of the neck in the ED. No neurologic abnormalities noted.   - will provide patient with melatonin to take at night for  possibly headache and ibuprofen as needed to follow up  - ketorolac (TORADOL) 30 MG/ML injection 30 mg; Inject 1 mL (30 mg total) into the muscle once.

## 2016-08-27 NOTE — Assessment & Plan Note (Signed)
Tension headache vs Migraines. Patient with hx of headaches, generally controlled with Ibuprofen. Possibly exacerbated by musculoskeletal pain, patient recent seen for muscle spasm of the neck in the ED. No neurologic abnormalities noted.   - will provide patient with melatonin to take at night for possibly headache and ibuprofen as needed to follow up  - ketorolac (TORADOL) 30 MG/ML injection 30 mg; Inject 1 mL (30 mg total) into the muscle once.

## 2016-08-27 NOTE — Patient Instructions (Addendum)
I am going to give you a shot of Toradol to help with the pain now. I want you try taking melatonin at night for headache. Continue using Ibuprofen in the future as needed.    Tension Headache A tension headache is a feeling of pain, pressure, or aching that is often felt over the front and sides of the head. The pain can be dull, or it can feel tight (constricting). Tension headaches are not normally associated with nausea or vomiting, and they do not get worse with physical activity. Tension headaches can last from 30 minutes to several days. This is the most common type of headache. CAUSES The exact cause of this condition is not known. Tension headaches often begin after stress, anxiety, or depression. Other triggers may include:  Alcohol.  Too much caffeine, or caffeine withdrawal.  Respiratory infections, such as colds, flu, or sinus infections.  Dental problems or teeth clenching.  Fatigue.  Holding your head and neck in the same position for a long period of time, such as while using a computer.  Smoking. SYMPTOMS Symptoms of this condition include:  A feeling of pressure around the head.  Dull, aching head pain.  Pain felt over the front and sides of the head.  Tenderness in the muscles of the head, neck, and shoulders. DIAGNOSIS This condition may be diagnosed based on your symptoms and a physical exam. Tests may be done, such as a CT scan or an MRI of your head. These tests may be done if your symptoms are severe or unusual. TREATMENT This condition may be treated with lifestyle changes and medicines to help relieve symptoms. HOME CARE INSTRUCTIONS Managing Pain  Take over-the-counter and prescription medicines only as told by your health care provider.  Lie down in a dark, quiet room when you have a headache.  If directed, apply ice to the head and neck area:  Put ice in a plastic bag.  Place a towel between your skin and the bag.  Leave the ice on for 20  minutes, 2-3 times per day.  Use a heating pad or a hot shower to apply heat to the head and neck area as told by your health care provider. Eating and Drinking  Eat meals on a regular schedule.  Limit alcohol use.  Decrease your caffeine intake, or stop using caffeine. General Instructions  Keep all follow-up visits as told by your health care provider. This is important.  Keep a headache journal to help find out what may trigger your headaches. For example, write down:  What you eat and drink.  How much sleep you get.  Any change to your diet or medicines.  Try massage or other relaxation techniques.  Limit stress.  Sit up straight, and avoid tensing your muscles.  Do not use tobacco products, including cigarettes, chewing tobacco, or e-cigarettes. If you need help quitting, ask your health care provider.  Exercise regularly as told by your health care provider.  Get 7-9 hours of sleep, or the amount recommended by your health care provider. SEEK MEDICAL CARE IF:  Your symptoms are not helped by medicine.  You have a headache that is different from what you normally experience.  You have nausea or you vomit.  You have a fever. SEEK IMMEDIATE MEDICAL CARE IF:  Your headache becomes severe.  You have repeated vomiting.  You have a stiff neck.  You have a loss of vision.  You have problems with speech.  You have pain in your  eye or ear.  You have muscular weakness or loss of muscle control.  You lose your balance or you have trouble walking.  You feel faint or you pass out.  You have confusion.   This information is not intended to replace advice given to you by your health care provider. Make sure you discuss any questions you have with your health care provider.   Document Released: 12/06/2005 Document Revised: 08/27/2015 Document Reviewed: 03/31/2015 Elsevier Interactive Patient Education [employer].

## 2016-09-06 ENCOUNTER — Telehealth: Payer: Self-pay | Admitting: Obstetrics and Gynecology

## 2016-09-06 MED ORDER — CLINDAMYCIN HCL 300 MG PO CAPS: 300.0000 mg | ORAL_CAPSULE | Freq: Two times a day (BID) | ORAL | 0 refills | Status: DC

## 2016-09-06 NOTE — Telephone Encounter (Signed)
Pt informed of rx sent to her pharmacy.

## 2016-09-06 NOTE — Telephone Encounter (Signed)
Pt would like test results from Aug 31 visit.

## 2016-09-06 NOTE — Telephone Encounter (Signed)
I called pt to inform her of her test results from August 31. Pt informed of negative blood work and GC/CT. Did pt have a bacterial infection?? Per wet prep clue cells and bacteria were found. Pt states she was not treated at visit and is still having vaginal dc with an odor.

## 2016-09-06 NOTE — Telephone Encounter (Signed)
Reviewing wet prep again I am not strongly convinced it is BV still. However did have some bacteria and clue cells but few. Neg whiff. She has h/o recurrent BV. Will send in clindamycin (intolerance to flagyl) for patient since she is still having concerning discharge.

## 2016-09-07 ENCOUNTER — Ambulatory Visit: Payer: Medicaid Other | Admitting: Obstetrics and Gynecology

## 2016-09-22 ENCOUNTER — Telehealth: Payer: Self-pay | Admitting: Obstetrics and Gynecology

## 2016-09-22 MED ORDER — FLUCONAZOLE 150 MG PO TABS: 150.0000 mg | ORAL_TABLET | Freq: Once | ORAL | 0 refills | Status: AC

## 2016-09-22 NOTE — Telephone Encounter (Signed)
Sent to her pharmacy. If still having vaginal discharge she needs a visit.

## 2016-09-22 NOTE — Telephone Encounter (Signed)
Pt is calling because she would like a refill on her yeast infection medication. Please call patient to let her know that this was sent in or if she needs an appointment. jw

## 2016-09-22 NOTE — Telephone Encounter (Signed)
Patient has an appt on 09-22-16 with pcp. Jaaliyah Lucatero,CMA

## 2016-09-24 ENCOUNTER — Ambulatory Visit (INDEPENDENT_AMBULATORY_CARE_PROVIDER_SITE_OTHER): Payer: Medicaid Other | Admitting: Obstetrics and Gynecology

## 2016-09-24 ENCOUNTER — Encounter: Payer: Self-pay | Admitting: Obstetrics and Gynecology

## 2016-09-24 VITALS — BP 124/80 | HR 81 | Temp 97.9°F | Wt 179.0 lb

## 2016-09-24 DIAGNOSIS — I10 Essential (primary) hypertension: Secondary | ICD-10-CM

## 2016-09-24 DIAGNOSIS — B373 Candidiasis of vulva and vagina: Secondary | ICD-10-CM

## 2016-09-24 DIAGNOSIS — B3731 Acute candidiasis of vulva and vagina: Secondary | ICD-10-CM

## 2016-09-24 DIAGNOSIS — R87619 Unspecified abnormal cytological findings in specimens from cervix uteri: Secondary | ICD-10-CM

## 2016-09-24 DIAGNOSIS — Z23 Encounter for immunization: Secondary | ICD-10-CM | POA: Diagnosis present

## 2016-09-24 MED ORDER — FLUCONAZOLE 150 MG PO TABS: 150.0000 mg | ORAL_TABLET | Freq: Once | ORAL | 0 refills | Status: AC

## 2016-09-24 NOTE — Progress Notes (Signed)
     Subjective: Chief Complaint  Patient presents with  . Hypertension     HPI: Amy Snyder is a 25 y.o. presenting to clinic today to discuss the following:  #Hypertension Blood pressure at home: does not check; did however go to [employer] to check about 2 days ago. It was elevated to 156/103. Patient did not take BP medication at that time. Denies any symptoms with elevated BP. Blood pressure today: wnl Taking Meds: Yes, states she only missed taking it that one day Side effects: None ROS: Denies headache, dizziness, visual changes, nausea, vomiting, chest pain, abdominal pain or shortness of breath.  #Vaginal Discharge -believes she still has a yeast infection -starting to have some increased itching  #Abnormal Pap -last pap was in 2015. ASCUS noted -has not followed up with OBGYN office -h/o high and low grade dysplasia  Health Maintenance: flu vaccine due    ROS noted in HPI.  Past Medical, Surgical, Social, and Family History Reviewed & Updated per EMR. Smoking status - Smoker   Objective: BP 124/80   Pulse 81   Temp 97.9 F (36.6 C) (Oral)   Wt 179 lb (81.2 kg)   LMP 08/29/2016   SpO2 99%   BMI 28.04 kg/m  Vitals and nursing notes reviewed  Physical Exam  Constitutional: She is well-developed, well-nourished, and in no distress.  Cardiovascular: Normal rate, regular rhythm, normal heart sounds and intact distal pulses.   Pulmonary/Chest: Effort normal and breath sounds normal.  Abdominal: Soft. There is no tenderness.   Assessment/Plan: Please see problem based Assessment and Plan  Health Maintainance: flu vaccine given   Orders Placed This Encounter  Procedures  . Flu Vaccine QUAD 36+ mos IM    Meds ordered this encounter  Medications  . fluconazole (DIFLUCAN) 150 MG tablet    Sig: Take 1 tablet (150 mg total) by mouth once.    Dispense:  1 tablet    Refill:  Brandon, DO 09/24/2016, 4:22 PM PGY-3, [place]

## 2016-09-24 NOTE — Patient Instructions (Signed)
Flu shot given today. Decrease coreg dose to one pill once a day for 1-2 weeks, then discontinue Continue HCTZ, check BP intermittenly  Follow-up with me in 3 weeks

## 2016-09-26 DIAGNOSIS — R87619 Unspecified abnormal cytological findings in specimens from cervix uteri: Secondary | ICD-10-CM | POA: Insufficient documentation

## 2016-09-26 NOTE — Assessment & Plan Note (Signed)
H/o of abnormal paps. In chart saw HGSIL, LGSIL. Most recent pap in 2016 with ASCUS. Patient encouraged to follow-up with her OBGYN for repeat testing.

## 2016-09-26 NOTE — Assessment & Plan Note (Signed)
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 Rx for fluconazole. Discussed with patient she can also try the OTC miconazole creams to use for itching in vulva area. If not treated with this dose patient encouraged to come back for retesting.

## 2016-09-26 NOTE — Assessment & Plan Note (Signed)
H/o uncontrolled HTN due to patient noncompliance. Currently taking Coreg and HCTZ with good BP control today. Patient given instructions to titrate down off of Coreg for discontinuation; no indication for medication and was only continued after pregnancy. Continue HCTZ. Follow-up next week in nurse clinic for BP check. Monitor BP at home with goal BP <140/80. Patient to follow-up in clinic in 2 weeks. Will likely need additional agent in future. Check blood work at next visit. Handout given with instructions.

## 2016-09-27 ENCOUNTER — Other Ambulatory Visit: Payer: Self-pay | Admitting: General Surgery

## 2016-09-27 DIAGNOSIS — R222 Localized swelling, mass and lump, trunk: Secondary | ICD-10-CM

## 2016-10-09 ENCOUNTER — Ambulatory Visit
Admission: RE | Admit: 2016-10-09 | Discharge: 2016-10-09 | Disposition: A | Discharge status: Home / Self Care | Payer: Medicaid Other | Source: Ambulatory Visit | Attending: General Surgery | Admitting: General Surgery

## 2016-10-09 DIAGNOSIS — R222 Localized swelling, mass and lump, trunk: Secondary | ICD-10-CM

## 2016-10-09 MED ORDER — GADOBENATE DIMEGLUMINE 529 MG/ML IV SOLN
16.0000 mL | Freq: Once | INTRAVENOUS | Status: AC | PRN
  Administered 2016-10-09: 16 mL via INTRAVENOUS

## 2016-10-12 ENCOUNTER — Ambulatory Visit (INDEPENDENT_AMBULATORY_CARE_PROVIDER_SITE_OTHER): Payer: Medicaid Other | Admitting: Obstetrics

## 2016-10-12 ENCOUNTER — Other Ambulatory Visit (HOSPITAL_COMMUNITY)
Admission: RE | Admit: 2016-10-12 | Discharge: 2016-10-12 | Disposition: A | Discharge status: Home / Self Care | Payer: Medicaid Other | Source: Ambulatory Visit | Attending: Obstetrics | Admitting: Obstetrics

## 2016-10-12 ENCOUNTER — Encounter: Payer: Self-pay | Admitting: Obstetrics

## 2016-10-12 VITALS — BP 123/81 | HR 72 | Temp 98.0°F | Ht 67.0 in | Wt 178.0 lb

## 2016-10-12 DIAGNOSIS — N760 Acute vaginitis: Secondary | ICD-10-CM

## 2016-10-12 DIAGNOSIS — Z01419 Encounter for gynecological examination (general) (routine) without abnormal findings: Secondary | ICD-10-CM | POA: Diagnosis not present

## 2016-10-12 DIAGNOSIS — R29898 Other symptoms and signs involving the musculoskeletal system: Secondary | ICD-10-CM

## 2016-10-12 DIAGNOSIS — B373 Candidiasis of vulva and vagina: Secondary | ICD-10-CM

## 2016-10-12 DIAGNOSIS — Z113 Encounter for screening for infections with a predominantly sexual mode of transmission: Secondary | ICD-10-CM | POA: Diagnosis present

## 2016-10-12 DIAGNOSIS — B3731 Acute candidiasis of vulva and vagina: Secondary | ICD-10-CM

## 2016-10-12 DIAGNOSIS — M6289 Other specified disorders of muscle: Secondary | ICD-10-CM

## 2016-10-12 DIAGNOSIS — Z124 Encounter for screening for malignant neoplasm of cervix: Secondary | ICD-10-CM

## 2016-10-12 DIAGNOSIS — B9689 Other specified bacterial agents as the cause of diseases classified elsewhere: Secondary | ICD-10-CM

## 2016-10-12 MED ORDER — CLINDAMYCIN HCL 300 MG PO CAPS: 300.0000 mg | ORAL_CAPSULE | Freq: Two times a day (BID) | ORAL | 2 refills | Status: DC

## 2016-10-12 MED ORDER — FLUCONAZOLE 150 MG PO TABS: 150.0000 mg | ORAL_TABLET | Freq: Once | ORAL | 2 refills | Status: AC

## 2016-10-12 NOTE — Progress Notes (Signed)
Subjective:        Amy Snyder is a 25 y.o. female here for a routine exam.  Current complaints: None.    Personal health questionnaire:  Is patient [name], have a family history of breast and/or ovarian cancer: no Is there a family history of uterine cancer diagnosed at age < 41, gastrointestinal cancer, urinary tract cancer, family member who is a Field seismologist syndrome-associated carrier: no Is the patient overweight and hypertensive, family history of diabetes, personal history of gestational diabetes, preeclampsia or PCOS: no Is patient over 75, have PCOS,  family history of premature CHD under age 61, diabetes, smoke, have hypertension or peripheral artery disease:  no At any time, has a partner hit, kicked or otherwise hurt or frightened you?: no Over the past 2 weeks, have you felt down, depressed or hopeless?: no Over the past 2 weeks, have you felt little interest or pleasure in doing things?:no   Gynecologic History Patient's last menstrual period was 10/03/2016. Contraception: none Last Pap: 2014. Results were: normal Last mammogram: n/a. Results were: n/a  Obstetric History OB History  Gravida Para Term Preterm AB Living  1 1   1   1   SAB TAB Ectopic Multiple Live Births          1    # Outcome Date GA Lbr Len/2nd Weight Sex Delivery Anes PTL Lv  1 Preterm 10/21/09 [redacted]w[redacted]d  2 lb 4 oz (1.021 kg) M CS-Unspec EPI  LIV     Birth Comments: [facility]      Past Medical History:  Diagnosis Date  . BV (bacterial vaginosis)   . Hypertension     Past Surgical History:  Procedure Laterality Date  . CESAREAN SECTION  Oct 21, 2009     Current Outpatient Prescriptions:  .  carvedilol (COREG) 3.125 MG tablet, Take 2 tablets (6.25 mg total) by mouth 2 (two) times daily with a meal., Disp: 120 tablet, Rfl: 3 .  hydrochlorothiazide (HYDRODIURIL) 25 MG tablet, Take 1 tablet (25 mg total) by mouth daily., Disp: 60 tablet, Rfl: 3 Allergies  Allergen Reactions  . Flagyl  [Metronidazole] Itching, Swelling and Rash    Social History  Substance Use Topics  . Smoking status: Current Some Day Smoker    Packs/day: 0.20    Years: 3.00    Types: Cigarettes  . Smokeless tobacco: Never Used     Comment: smokes socially  . Alcohol use 0.0 oz/week     Comment: socially    Family History  Problem Relation Age of Onset  . Hypertension Mother   . Diabetes        Review of Systems  Constitutional: negative for fatigue and weight loss Respiratory: negative for cough and wheezing Cardiovascular: negative for chest pain, fatigue and palpitations Gastrointestinal: positive for abdominal pain  Musculoskeletal:negative for myalgias Neurological: negative for gait problems and tremors Behavioral/Psych: negative for abusive relationship, depression Endocrine: negative for temperature intolerance   Genitourinary:negative for abnormal menstrual periods, genital lesions, hot flashes, sexual problems and vaginal discharge Integument/breast: negative for breast lump, breast tenderness, nipple discharge and skin lesion(s)    Objective:       BP 123/81   Pulse 72   Temp 98 F (36.7 C)   Ht 5\' 7"  (1.702 m)   Wt 178 lb (80.7 kg)   LMP 10/03/2016   BMI 27.88 kg/m  General:   alert  Skin:   no rash or abnormalities  Lungs:   clear to auscultation bilaterally  Heart:   regular rate and rhythm, S1, S2 normal, no murmur, click, rub or gallop  Breasts:   normal without suspicious masses, skin or nipple changes or axillary nodes  Abdomen:  tender mass of rectus muscle  Pelvis:  External genitalia: normal general appearance Urinary system: urethral meatus normal and bladder without fullness, nontender Vaginal: normal without tenderness, induration or masses Cervix: normal appearance Adnexa: normal bimanual exam Uterus: anteverted and non-tender, normal size   Lab Review Urine pregnancy test Labs reviewed no Radiologic studies reviewed no  50% of 20 min visit  spent on counseling and coordination of care.   Assessment:    Healthy female exam.    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    Contraceptive counseling and advice.  Does not want contraception.   Plan:    To follow up with [facility] for rectus mass.  Education reviewed: low fat, low cholesterol diet, safe sex/STD prevention, self breast exams, smoking cessation and weight bearing exercise. Contraception: condoms. Follow up in: 1 year.   No orders of the defined types were placed in this encounter.  No orders of the defined types were placed in this encounter.     [name], female   DOB: Apr 04, 1991, 25 y.o.   MRN: [id number]

## 2016-10-14 LAB — CERVICOVAGINAL ANCILLARY ONLY
Chlamydia: NEGATIVE
Neisseria Gonorrhea: NEGATIVE
TRICH (WINDOWPATH): NEGATIVE

## 2016-10-18 LAB — CERVICOVAGINAL ANCILLARY ONLY: Candida vaginitis: NEGATIVE

## 2016-10-19 ENCOUNTER — Other Ambulatory Visit: Payer: Self-pay | Admitting: Obstetrics

## 2016-10-21 LAB — CYTOLOGY - PAP: DIAGNOSIS: NEGATIVE

## 2016-10-22 ENCOUNTER — Telehealth: Payer: Self-pay | Admitting: *Deleted

## 2016-10-22 NOTE — Telephone Encounter (Signed)
Pt called to office for lab results.  Attempt to contact pt.  LM on VM to call office.

## 2016-10-30 ENCOUNTER — Encounter (HOSPITAL_COMMUNITY): Payer: Self-pay

## 2016-10-30 ENCOUNTER — Emergency Department (HOSPITAL_COMMUNITY): Payer: Medicaid Other

## 2016-10-30 ENCOUNTER — Emergency Department (HOSPITAL_COMMUNITY)
Admission: EM | Admit: 2016-10-30 | Discharge: 2016-10-30 | Disposition: A | Discharge status: Home / Self Care | Payer: Medicaid Other | Attending: Emergency Medicine | Admitting: Emergency Medicine

## 2016-10-30 DIAGNOSIS — F17210 Nicotine dependence, cigarettes, uncomplicated: Secondary | ICD-10-CM | POA: Insufficient documentation

## 2016-10-30 DIAGNOSIS — I10 Essential (primary) hypertension: Secondary | ICD-10-CM | POA: Insufficient documentation

## 2016-10-30 DIAGNOSIS — R0789 Other chest pain: Secondary | ICD-10-CM | POA: Diagnosis present

## 2016-10-30 LAB — BASIC METABOLIC PANEL
Anion gap: 9 (ref 5–15)
BUN: 7 mg/dL (ref 6–20)
CALCIUM: 9.7 mg/dL (ref 8.9–10.3)
CHLORIDE: 100 mmol/L — AB (ref 101–111)
CO2: 26 mmol/L (ref 22–32)
CREATININE: 0.83 mg/dL (ref 0.44–1.00)
GFR calc non Af Amer: >60 mL/min (ref >60)
GLUCOSE: 91 mg/dL (ref 65–99)
Potassium: 3.4 mmol/L — ABNORMAL LOW (ref 3.5–5.1)
Sodium: 135 mmol/L (ref 135–145)

## 2016-10-30 LAB — CBC WITH DIFFERENTIAL/PLATELET
BASOS PCT: 0 %
Basophils Absolute: 0 10*3/uL (ref 0.0–0.1)
Eosinophils Absolute: 0.3 10*3/uL (ref 0.0–0.7)
Eosinophils Relative: 3 %
HEMATOCRIT: 40.7 % (ref 36.0–46.0)
HEMOGLOBIN: 14 g/dL (ref 12.0–15.0)
LYMPHS ABS: 3.8 10*3/uL (ref 0.7–4.0)
LYMPHS PCT: 46 %
MCH: 30.2 pg (ref 26.0–34.0)
MCHC: 34.4 g/dL (ref 30.0–36.0)
MCV: 87.7 fL (ref 78.0–100.0)
MONO ABS: 0.7 10*3/uL (ref 0.1–1.0)
MONOS PCT: 8 %
NEUTROS ABS: 3.5 10*3/uL (ref 1.7–7.7)
NEUTROS PCT: 43 %
Platelets: 217 10*3/uL (ref 150–400)
RBC: 4.64 MIL/uL (ref 3.87–5.11)
RDW: 12.1 % (ref 11.5–15.5)
WBC: 8.2 10*3/uL (ref 4.0–10.5)

## 2016-10-30 LAB — I-STAT BETA HCG BLOOD, ED (MC, WL, AP ONLY): I-stat hCG, quantitative: <5 m[IU]/mL (ref <5)

## 2016-10-30 LAB — I-STAT TROPONIN, ED: Troponin i, poc: 0 ng/mL (ref 0.00–0.08)

## 2016-10-30 LAB — D-DIMER, QUANTITATIVE: D-Dimer, Quant: <0.27 ug/mL-FEU (ref 0.00–0.50)

## 2016-10-30 NOTE — ED Notes (Signed)
Delay in lab draw,  Pt in bathroom. 

## 2016-10-30 NOTE — ED Notes (Signed)
Patient taken to XRAY

## 2016-10-30 NOTE — ED Provider Notes (Signed)
[facility] DEPT Provider Note   CSN: [id number] Arrival date & time: 10/30/16  1512     History   Chief Complaint Chief Complaint  Patient presents with  . Chest Pain    HPI Amy Snyder is a 25 y.o. female.  HPI Amy Snyder is a 25 y.o. female with history of hypertension, presents to emergency department complaining of chest pain. Patient states pain started this morning when she got to work. She describes it as "discomfort." She denies any severe pain. She states pain is worsened with deep breathing. She denies any exertional symptoms. She denies any cough or congestion. No history of asthma.Pain or swelling in her legs. She denies any recent travel or surgeries. She does not take any birth control or hormone replacement. She does smoke. She reports similar pain approximately half a year ago, states it went away on its own, she did come to the ER but was not given a diagnosis. No treatment prior to coming in. No other complaints.   Past Medical History:  Diagnosis Date  . BV (bacterial vaginosis)   . Hypertension     Patient Active Problem List   Diagnosis Date Noted  . Abnormal Pap smear of cervix 09/26/2016  . Headache 08/27/2016  . Essential hypertension, benign 07/03/2014  . Candida vaginitis 01/21/2014  . 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 Excessive or frequent menstruation 08/02/2013    Past Surgical History:  Procedure Laterality Date  . CESAREAN SECTION  Oct 21, 2009    OB History    Gravida Para Term Preterm AB Living   1 1   1   1    SAB TAB Ectopic Multiple Live Births           1       Home Medications    Prior to Admission medications   Medication Sig Start Date End Date Taking? Authorizing Provider  carvedilol (COREG) 3.125 MG tablet Take 2 tablets (6.25 mg total) by mouth 2 (two) times daily with a meal. Patient taking differently: Take 3.125 mg by mouth daily.  08/19/16  Yes Katheren Shams, DO  clindamycin (CLEOCIN) 300 MG capsule  Take 1 capsule (300 mg total) by mouth 2 (two) times daily. 10/12/16  Yes Shelly Bombard, MD  hydrochlorothiazide (HYDRODIURIL) 25 MG tablet Take 1 tablet (25 mg total) by mouth daily. 08/19/16  Yes Katheren Shams, DO    Family History Family History  Problem Relation Age of Onset  . Hypertension Mother   . Diabetes      Social History Social History  Substance Use Topics  . Smoking status: Current Some Day Smoker    Packs/day: 0.20    Years: 3.00    Types: Cigarettes  . Smokeless tobacco: Never Used     Comment: smokes socially  . Alcohol use 0.0 oz/week     Comment: socially     Allergies   Flagyl [metronidazole]   Review of Systems Review of Systems  Constitutional: Negative for chills and fever.  Respiratory: Positive for chest tightness and shortness of breath. Negative for cough.   Cardiovascular: Positive for chest pain. Negative for palpitations and leg swelling.  Gastrointestinal: Negative for abdominal pain, diarrhea, nausea and vomiting.  Genitourinary: Negative for dysuria, flank pain and pelvic pain.  Musculoskeletal: Negative for arthralgias, myalgias, neck pain and neck stiffness.  Skin: Negative for rash.  Neurological: Negative for dizziness, weakness and headaches.  All other systems reviewed and are negative.    Physical  Exam Updated Vital Signs BP 129/89 (BP Location: Right Arm)   Pulse 70   Temp 97.8 F (36.6 C) (Oral)   Resp 14   Wt 81.6 kg   LMP 10/03/2016   SpO2 96%   BMI 28.19 kg/m   Physical Exam  Constitutional: She is oriented to person, place, and time. She appears well-developed and well-nourished. No distress.  HENT:  Head: Normocephalic.  Eyes: Conjunctivae are normal.  Neck: Neck supple.  Cardiovascular: Normal rate, regular rhythm and normal heart sounds.   Pulmonary/Chest: Effort normal and breath sounds normal. No respiratory distress. She has no wheezes. She has no rales. She exhibits no tenderness.  Abdominal:  Soft. Bowel sounds are normal. She exhibits no distension. There is no tenderness. There is no rebound.  Musculoskeletal: She exhibits no edema.  Neurological: She is alert and oriented to person, place, and time.  Skin: Skin is warm and dry.  Psychiatric: She has a normal mood and affect. Her behavior is normal.  Nursing note and vitals reviewed.    ED Treatments / Results  Labs (all labs ordered are listed, but only abnormal results are displayed) Labs Reviewed  BASIC METABOLIC PANEL - Abnormal; Notable for the following:       Result Value   Potassium 3.4 (*)    Chloride 100 (*)    All other components within normal limits  CBC WITH DIFFERENTIAL/PLATELET  D-DIMER, QUANTITATIVE (NOT AT [facility])  I-STAT BETA HCG BLOOD, ED ([facility], [facility], [facility] ONLY)  I-STAT TROPOININ, ED    EKG  EKG Interpretation  Date/Time:  Saturday October 30 2016 15:19:13 EST Ventricular Rate:  70 PR Interval:  160 QRS Duration: 98 QT Interval:  408 QTC Calculation: 440 R Axis:   86 Text Interpretation:  Normal sinus rhythm Possible Left atrial enlargement Nonspecific ST and T wave abnormality Abnormal ECG no significant change since April 2017 Confirmed by Regenia Skeeter MD, SCOTT [phone]) on 10/30/2016 3:35:13 PM       Radiology Dg Chest 2 View  Result Date: 10/30/2016 CLINICAL DATA:  Midsternal chest pain began this morning. EXAM: CHEST  2 VIEW COMPARISON:  03/26/2016 FINDINGS: The heart size and mediastinal contours are within normal limits. Both lungs are clear. The visualized skeletal structures are unremarkable. IMPRESSION: No active cardiopulmonary disease. Electronically Signed   By: Misty Stanley M.D.   On: 10/30/2016 17:07    Procedures Procedures (including critical care time)  Medications Ordered in ED Medications - No data to display   Initial Impression / Assessment and Plan / ED Course  I have reviewed the triage vital signs and the nursing notes.  Pertinent labs & imaging results that were  available during my care of the patient were reviewed by me and considered in my medical decision making (see chart for details).  Clinical Course     Pt seen and examined. Pt with chest pain, pleuritic symptoms. ECG with T wave changes, however unchanged from 6 months ago. No hx of cardiac problem. Low risk for PE, will get a d dimer given pleuritic pain, sob, pt is a smoker. Only risk factor for ACS is htn. Will monitor.   6:03 PM Chest x-ray negative. Labs including troponin and d-dimer are unremarkable. Constant pressure since this morning, do not think we need to do tell the troponin. Pain is atypical. Heart score of 1. Doubt PE with normal vital signs and negative d-dimer. Patient admits that she has been under a lot of stress, and reports anxiety. Suspect this  could be the cause of her symptoms versus musculoskeletal pain. We'll discharge home, follow-up with family doctor if not improving. Return precautions discussed  Vitals:   10/30/16 1521 10/30/16 1522 10/30/16 1542 10/30/16 1631  BP: (!) 146/101  129/89 127/85  Pulse: 72  70 63  Resp: 18  14 14   Temp: 97.8 F (36.6 C)     TempSrc: Oral     SpO2: 100%  96% 100%  Weight:  81.6 kg       Final Clinical Impressions(s) / ED Diagnoses   Final diagnoses:  Atypical chest pain    New Prescriptions New Prescriptions   No medications on file     Jeannett Senior, PA-C 10/30/16 [place], MD 10/30/16 1807

## 2016-10-30 NOTE — Discharge Instructions (Signed)
Try avoid stressors and strenuous activity. Follow up with family doctor if not improving.

## 2016-10-30 NOTE — ED Notes (Signed)
Patient Alert and oriented X4. Stable and ambulatory. Patient verbalized understanding of the discharge instructions.  Patient belongings were taken by the patient.  

## 2016-10-30 NOTE — ED Triage Notes (Signed)
Patient complains of chest tightness since earlier today. No associated symptoms. Seen recently for same and thinks related to stress. States that she currently has a lot of stress occuring

## 2016-11-15 ENCOUNTER — Other Ambulatory Visit (HOSPITAL_COMMUNITY): Payer: Self-pay | Admitting: General Surgery

## 2016-11-15 DIAGNOSIS — R222 Localized swelling, mass and lump, trunk: Secondary | ICD-10-CM

## 2016-11-24 ENCOUNTER — Other Ambulatory Visit: Payer: Self-pay | Admitting: General Surgery

## 2016-11-25 ENCOUNTER — Encounter (HOSPITAL_COMMUNITY): Payer: Self-pay

## 2016-11-25 ENCOUNTER — Ambulatory Visit (HOSPITAL_COMMUNITY)
Admission: RE | Admit: 2016-11-25 | Discharge: 2016-11-25 | Disposition: A | Discharge status: Home / Self Care | Payer: Medicaid Other | Source: Ambulatory Visit | Attending: General Surgery | Admitting: General Surgery

## 2016-11-25 DIAGNOSIS — F17210 Nicotine dependence, cigarettes, uncomplicated: Secondary | ICD-10-CM | POA: Insufficient documentation

## 2016-11-25 DIAGNOSIS — N808 Other endometriosis: Secondary | ICD-10-CM | POA: Diagnosis not present

## 2016-11-25 DIAGNOSIS — Z8249 Family history of ischemic heart disease and other diseases of the circulatory system: Secondary | ICD-10-CM | POA: Insufficient documentation

## 2016-11-25 DIAGNOSIS — I10 Essential (primary) hypertension: Secondary | ICD-10-CM | POA: Insufficient documentation

## 2016-11-25 DIAGNOSIS — R222 Localized swelling, mass and lump, trunk: Secondary | ICD-10-CM

## 2016-11-25 DIAGNOSIS — Z888 Allergy status to other drugs, medicaments and biological substances status: Secondary | ICD-10-CM | POA: Diagnosis not present

## 2016-11-25 DIAGNOSIS — Z79899 Other long term (current) drug therapy: Secondary | ICD-10-CM | POA: Insufficient documentation

## 2016-11-25 DIAGNOSIS — R1909 Other intra-abdominal and pelvic swelling, mass and lump: Secondary | ICD-10-CM | POA: Diagnosis present

## 2016-11-25 LAB — CBC
HCT: 41.4 % (ref 36.0–46.0)
Hemoglobin: 14.5 g/dL (ref 12.0–15.0)
MCH: 30.3 pg (ref 26.0–34.0)
MCHC: 35 g/dL (ref 30.0–36.0)
MCV: 86.6 fL (ref 78.0–100.0)
PLATELETS: 265 10*3/uL (ref 150–400)
RBC: 4.78 MIL/uL (ref 3.87–5.11)
RDW: 12.2 % (ref 11.5–15.5)
WBC: 7.1 10*3/uL (ref 4.0–10.5)

## 2016-11-25 LAB — PROTIME-INR
INR: 0.98
PROTHROMBIN TIME: 13 s (ref 11.4–15.2)

## 2016-11-25 LAB — APTT: aPTT: 32 seconds (ref 24–36)

## 2016-11-25 MED ORDER — SODIUM CHLORIDE 0.9 % IV SOLN
INTRAVENOUS | Status: DC
  Administered 2016-11-25: 12:00:00 via INTRAVENOUS

## 2016-11-25 MED ORDER — HYDROCODONE-ACETAMINOPHEN 5-325 MG PO TABS
1.0000 | ORAL_TABLET | Freq: Once | ORAL | Status: AC
  Administered 2016-11-25: 1 via ORAL
  Filled 2016-11-25: qty 1

## 2016-11-25 MED ORDER — FENTANYL CITRATE (PF) 100 MCG/2ML IJ SOLN
INTRAMUSCULAR | Status: AC | PRN
  Administered 2016-11-25 (×2): 25 ug via INTRAVENOUS
  Administered 2016-11-25: 50 ug via INTRAVENOUS

## 2016-11-25 MED ORDER — MIDAZOLAM HCL 2 MG/2ML IJ SOLN
INTRAMUSCULAR | Status: AC
  Filled 2016-11-25: qty 6

## 2016-11-25 MED ORDER — MIDAZOLAM HCL 2 MG/2ML IJ SOLN
INTRAMUSCULAR | Status: AC | PRN
  Administered 2016-11-25: 2 mg via INTRAVENOUS
  Administered 2016-11-25 (×4): 1 mg via INTRAVENOUS

## 2016-11-25 MED ORDER — FENTANYL CITRATE (PF) 100 MCG/2ML IJ SOLN
INTRAMUSCULAR | Status: AC
  Filled 2016-11-25: qty 4

## 2016-11-25 NOTE — Procedures (Signed)
Technically successful US guided biopsy of indeterminate mass involving the caudal aspect of the right rectus abdominal musculature.   EBL: Minimal   No immediate complications.   Ronny Bacon, MD Pager #: [phone]

## 2016-11-25 NOTE — Discharge Instructions (Addendum)
Needle Biopsy, Care After Introduction These instructions give you information about caring for yourself after your procedure. Your doctor may also give you more specific instructions. Call your doctor if you have any problems or questions after your procedure. Follow these instructions at home:  Rest as told by your doctor.  Take medicines only as told by your doctor.  There are many different ways to close and cover the biopsy site, including stitches (sutures), skin glue, and adhesive strips. Follow instructions from your doctor about:  How to take care of your biopsy site.  When and how you should change your bandage (dressing).  When you should remove your dressing.  Removing whatever was used to close your biopsy site.  Check your biopsy site every day for signs of infection. Watch for:  Redness, swelling, or pain.  Fluid, blood, or pus. Contact a doctor if:  You have a fever.  You have redness, swelling, or pain at the biopsy site, and it lasts longer than a few days.  You have fluid, blood, or pus coming from the biopsy site.  You feel sick to your stomach (nauseous).  You throw up (vomit). Get help right away if:  You are short of breath.  You have trouble breathing.  Your chest hurts.  You feel dizzy or you pass out (faint).  You have bleeding that does not stop with pressure or a bandage.  You cough up blood.  Your belly (abdomen) hurts. This information is not intended to replace advice given to you by your health care provider. Make sure you discuss any questions you have with your health care provider. Document Released: 11/18/2008 Document Revised: 05/13/2016 Document Reviewed: 12/02/2014  2017 Elsevier Moderate Conscious Sedation, Adult Sedation is the use of medicines to promote relaxation and relieve discomfort and anxiety. Moderate conscious sedation is a type of sedation. Under moderate conscious sedation, you are less alert than normal, but  you are still able to respond to instructions, touch, or both. Moderate conscious sedation is used during short medical and dental procedures. It is milder than deep sedation, which is a type of sedation under which you cannot be easily woken up. It is also milder than general anesthesia, which is the use of medicines to make you unconscious. Moderate conscious sedation allows you to return to your regular activities sooner. Tell a health care provider about:  Any allergies you have.  All medicines you are taking, including vitamins, herbs, eye drops, creams, and over-the-counter medicines.  Use of steroids (by mouth or creams).  Any problems you or family members have had with sedatives and anesthetic medicines.  Any blood disorders you have.  Any surgeries you have had.  Any medical conditions you have, such as sleep apnea.  Whether you are pregnant or may be pregnant.  Any use of cigarettes, alcohol, marijuana, or street drugs. What are the risks? Generally, this is a safe procedure. However, problems may occur, including:  Getting too much medicine (oversedation).  Nausea.  Allergic reaction to medicines.  Trouble breathing. If this happens, a breathing tube may be used to help with breathing. It will be removed when you are awake and breathing on your own.  Heart trouble.  Lung trouble. What happens before the procedure? Staying hydrated  Follow instructions from your health care provider about hydration, which may include:  Up to 2 hours before the procedure - you may continue to drink clear liquids, such as water, clear fruit juice, black coffee, and plain tea.  Eating and drinking restrictions  Follow instructions from your health care provider about eating and drinking, which may include:  8 hours before the procedure - stop eating heavy meals or foods such as meat, fried foods, or fatty foods.  6 hours before the procedure - stop eating light meals or foods, such  as toast or cereal.  6 hours before the procedure - stop drinking milk or drinks that contain milk.  2 hours before the procedure - stop drinking clear liquids. Medicine  Ask your health care provider about:  Changing or stopping your regular medicines. This is especially important if you are taking diabetes medicines or blood thinners.  Taking medicines such as aspirin and ibuprofen. These medicines can thin your blood. Do not take these medicines before your procedure if your health care provider instructs you not to. Tests and exams  You will have a physical exam.  You may have blood tests done to show:  How well your kidneys and liver are working.  How well your blood can clot. General instructions  Plan to have someone take you home from the hospital or clinic.  If you will be going home right after the procedure, plan to have someone with you for 24 hours. What happens during the procedure?  An IV tube will be inserted into one of your veins.  Medicine to help you relax (sedative) will be given through the IV tube.  The medical or dental procedure will be performed. What happens after the procedure?  Your blood pressure, heart rate, breathing rate, and blood oxygen level will be monitored often until the medicines you were given have worn off.  Do not drive for 24 hours. This information is not intended to replace advice given to you by your health care provider. Make sure you discuss any questions you have with your health care provider. Document Released: 08/31/2001 Document Revised: 05/11/2016 Document Reviewed: 03/27/2016 Elsevier Interactive Patient Education  2017 [employer]. Moderate Conscious Sedation, Adult, Care After These instructions provide you with information about caring for yourself after your procedure. Your health care provider may also give you more specific instructions. Your treatment has been planned according to current medical practices, but  problems sometimes occur. Call your health care provider if you have any problems or questions after your procedure. What can I expect after the procedure? After your procedure, it is common:  To feel sleepy for several hours.  To feel clumsy and have poor balance for several hours.  To have poor judgment for several hours.  To vomit if you eat too soon. Follow these instructions at home: For at least 24 hours after the procedure:   Do not:  Participate in activities where you could fall or become injured.  Drive.  Use heavy machinery.  Drink alcohol.  Take sleeping pills or medicines that cause drowsiness.  Make important decisions or sign legal documents.  Take care of children on your own.  Rest. Eating and drinking  Follow the diet recommended by your health care provider.  If you vomit:  Drink water, juice, or soup when you can drink without vomiting.  Make sure you have little or no nausea before eating solid foods. General instructions  Have a responsible adult stay with you until you are awake and alert.  Take over-the-counter and prescription medicines only as told by your health care provider.  If you smoke, do not smoke without supervision.  Keep all follow-up visits as told by your health care  provider. This is important. Contact a health care provider if:  You keep feeling nauseous or you keep vomiting.  You feel light-headed.  You develop a rash.  You have a fever. Get help right away if:  You have trouble breathing. This information is not intended to replace advice given to you by your health care provider. Make sure you discuss any questions you have with your health care provider. Document Released: 09/26/2013 Document Revised: 05/10/2016 Document Reviewed: 03/27/2016 Elsevier Interactive Patient Education  2017 [employer].

## 2016-11-25 NOTE — H&P (Signed)
Chief Complaint: Patient was seen in consultation today for abdominal wall mass  Referring Physician(s): Ingram,Haywood  Supervising Physician: Sandi Mariscal  Patient Status: [facility] - Out-pt  History of Present Illness: Amy Snyder is a 25 y.o. female with past medical history of HTN, C-section, and 4-5 year history of abdominal pain found to have abdominal wall mass identified on MRI (10/09/16).   MR Abd (10/09/16) IMPRESSION: 1. The inferior aspect of the right rectus abdominis musculature is grossly abnormal. Specifically, the muscle appears expanded, demonstrates heterogeneous signal characteristics and areas of abnormal enhancement. In addition, there are small areas of abnormal signal and enhancement in the overlying subcutaneous fat. These findings are in immediate proximity to the C-section scar, and are strongly favored to represent sequela of endometrial implants with blood products of various stages throughout the musculature and overlie soft tissue, with abnormal signal intensity enhancement related to chronic inflammation. The superficial lesion demonstrated on image 55 of series 29 measuring 17 x 16 mm may a represent a suitable target for ultrasound-guided biopsy. Strictly speaking, an aggressive lesion such as a soft tissue sarcoma is not excluded, but is not favored.  IR was consulted at the request of Dr. Dalbert Batman for biopsy of abdominal wall mass.   Imaging reviewed by Dr. Anselm Pancoast who felt patient was appropriate for this procedure.  Patient is NPO.  She does not take blood thinners.  Her BP is elevated today.  She is prescribed medication, but did not take today.    Past Medical History:  Diagnosis Date  . BV (bacterial vaginosis)   . Hypertension     Past Surgical History:  Procedure Laterality Date  . CESAREAN SECTION  Oct 21, 2009    Allergies: Flagyl [metronidazole]  Medications: Prior to Admission medications   Medication Sig Start Date End  Date Taking? Authorizing Provider  carvedilol (COREG) 3.125 MG tablet Take 2 tablets (6.25 mg total) by mouth 2 (two) times daily with a meal. Patient taking differently: Take 3.125 mg by mouth daily.  08/19/16  Yes Katheren Shams, DO  clindamycin (CLEOCIN) 300 MG capsule Take 1 capsule (300 mg total) by mouth 2 (two) times daily. 10/12/16  Yes Shelly Bombard, MD  hydrochlorothiazide (HYDRODIURIL) 25 MG tablet Take 1 tablet (25 mg total) by mouth daily. 08/19/16  Yes Katheren Shams, DO     Family History  Problem Relation Age of Onset  . Hypertension Mother   . Diabetes      Social History   Social History  . Marital status: Single    Spouse name: N/A  . Number of children: 1  . Years of education: N/A   Occupational History  . [employer]    Social History Main Topics  . Smoking status: Current Some Day Smoker    Packs/day: 0.20    Years: 3.00    Types: Cigarettes  . Smokeless tobacco: Never Used     Comment: smokes socially  . Alcohol use 0.0 oz/week     Comment: socially  . Drug use: No  . Sexual activity: Yes    Partners: Male    Birth control/ protection: Condom     Comment: Sometimes   Other Topics Concern  . None   Social History Narrative  . None    Review of Systems  Constitutional: Negative for chills and fever.  Respiratory: Negative for choking and shortness of breath.   Cardiovascular: Negative for chest pain.  Gastrointestinal: Positive for abdominal pain (chronic tenderness/soreness  in the RLQ).  Psychiatric/Behavioral: Negative for behavioral problems and confusion.    Vital Signs: BP (!) 139/109   Physical Exam  Constitutional: She is oriented to person, place, and time. She appears well-developed.  Cardiovascular: Normal rate, regular rhythm and normal heart sounds.   Pulmonary/Chest: Effort normal and breath sounds normal. No respiratory distress.  Abdominal: Soft. She exhibits no distension. There is tenderness (consistent with her  usual soreness).  Neurological: She is alert and oriented to person, place, and time.  Skin: Skin is warm and dry.  Psychiatric: She has a normal mood and affect. Her behavior is normal. Judgment and thought content normal.  Nursing note and vitals reviewed.   Mallampati Score:  MD Evaluation Airway: WNL Heart: WNL Abdomen: WNL Chest/ Lungs: WNL ASA  Classification: 3 Mallampati/Airway Score: Two  Imaging: Dg Chest 2 View  Result Date: 10/30/2016 CLINICAL DATA:  Midsternal chest pain began this morning. EXAM: CHEST  2 VIEW COMPARISON:  03/26/2016 FINDINGS: The heart size and mediastinal contours are within normal limits. Both lungs are clear. The visualized skeletal structures are unremarkable. IMPRESSION: No active cardiopulmonary disease. Electronically Signed   By: Misty Stanley M.D.   On: 10/30/2016 17:07    Labs:  CBC:  Recent Labs  03/26/16 2222 10/30/16 1653 11/25/16 1145  WBC 9.1 8.2 7.1  HGB 14.8 14.0 14.5  HCT 42.7 40.7 41.4  PLT 274 217 265    COAGS:  Recent Labs  11/25/16 1145  INR 0.98  APTT 32    BMP:  Recent Labs  03/26/16 2222 10/30/16 1653  NA 136 135  K 3.4* 3.4*  CL 101 100*  CO2 25 26  GLUCOSE 100* 91  BUN 7 7  CALCIUM 9.9 9.7  CREATININE 0.76 0.83  GFRNONAA >60 >60  GFRAA >60 >60    LIVER FUNCTION TESTS: No results for input(s): BILITOT, AST, ALT, ALKPHOS, PROT, ALBUMIN in the last 8760 hours.  TUMOR MARKERS: No results for input(s): AFPTM, CEA, CA199, CHROMGRNA in the last 8760 hours.  Assessment and Plan:  Abdominal wall mass Patient with 4-5 year history of abdominal pain, C-section in 2010 presents with abdominal wall mass identified on MRI (10/09/16). Request for biopsy made by Dr. Dalbert Batman and approved by Dr. Anselm Pancoast.  Patients is scheduled for procedure today.  She has her home BP meds with her.  She may take them now.  Risks and Benefits discussed with the patient including, but not limited to bleeding, infection,  damage to adjacent structures or low yield requiring additional tests. All of the patient's questions were answered, patient is agreeable to proceed. Consent signed and in chart.   Thank you for this interesting consult.  I greatly enjoyed meeting Amy Snyder and look forward to participating in their care.  A copy of this report was sent to the requesting provider on this date.  Electronically Signed: Docia Barrier 11/25/2016, 12:51 PM   I spent a total of  30 Minutes   in face to face in clinical consultation, greater than 50% of which was counseling/coordinating care for abdominal wall mass.

## 2016-11-25 NOTE — Progress Notes (Signed)
Pt took own Bp meds, Coreg & HCTZ per Gareth Eagle PA

## 2016-12-27 ENCOUNTER — Ambulatory Visit: Payer: Medicaid Other | Admitting: Obstetrics

## 2017-01-12 ENCOUNTER — Emergency Department (HOSPITAL_COMMUNITY): Payer: Medicaid Other

## 2017-01-12 ENCOUNTER — Ambulatory Visit (INDEPENDENT_AMBULATORY_CARE_PROVIDER_SITE_OTHER): Payer: Medicaid Other | Admitting: Obstetrics

## 2017-01-12 ENCOUNTER — Encounter (HOSPITAL_COMMUNITY): Payer: Self-pay | Admitting: Emergency Medicine

## 2017-01-12 ENCOUNTER — Emergency Department (HOSPITAL_COMMUNITY)
Admission: EM | Admit: 2017-01-12 | Discharge: 2017-01-12 | Disposition: A | Discharge status: Home / Self Care | Payer: Medicaid Other | Attending: Emergency Medicine | Admitting: Emergency Medicine

## 2017-01-12 ENCOUNTER — Other Ambulatory Visit (HOSPITAL_COMMUNITY)
Admission: RE | Admit: 2017-01-12 | Discharge: 2017-01-12 | Disposition: A | Discharge status: Home / Self Care | Payer: Medicaid Other | Source: Ambulatory Visit | Attending: Obstetrics | Admitting: Obstetrics

## 2017-01-12 ENCOUNTER — Encounter: Payer: Self-pay | Admitting: Obstetrics

## 2017-01-12 VITALS — BP 150/87 | HR 71 | Wt 185.0 lb

## 2017-01-12 DIAGNOSIS — B9689 Other specified bacterial agents as the cause of diseases classified elsewhere: Secondary | ICD-10-CM

## 2017-01-12 DIAGNOSIS — N809 Endometriosis, unspecified: Secondary | ICD-10-CM

## 2017-01-12 DIAGNOSIS — Z3202 Encounter for pregnancy test, result negative: Secondary | ICD-10-CM | POA: Diagnosis not present

## 2017-01-12 DIAGNOSIS — N760 Acute vaginitis: Secondary | ICD-10-CM

## 2017-01-12 DIAGNOSIS — I10 Essential (primary) hypertension: Secondary | ICD-10-CM | POA: Insufficient documentation

## 2017-01-12 DIAGNOSIS — Z113 Encounter for screening for infections with a predominantly sexual mode of transmission: Secondary | ICD-10-CM | POA: Insufficient documentation

## 2017-01-12 DIAGNOSIS — N80129 Deep endometriosis of ovary, unspecified ovary: Secondary | ICD-10-CM

## 2017-01-12 DIAGNOSIS — F17210 Nicotine dependence, cigarettes, uncomplicated: Secondary | ICD-10-CM | POA: Diagnosis not present

## 2017-01-12 DIAGNOSIS — R350 Frequency of micturition: Secondary | ICD-10-CM | POA: Diagnosis not present

## 2017-01-12 DIAGNOSIS — R079 Chest pain, unspecified: Secondary | ICD-10-CM

## 2017-01-12 LAB — CBC WITH DIFFERENTIAL/PLATELET
BASOS ABS: 0 10*3/uL (ref 0.0–0.1)
Basophils Relative: 0 %
Eosinophils Absolute: 0.3 10*3/uL (ref 0.0–0.7)
Eosinophils Relative: 4 %
HCT: 39.1 % (ref 36.0–46.0)
Hemoglobin: 13.3 g/dL (ref 12.0–15.0)
LYMPHS PCT: 47 %
Lymphs Abs: 3.5 10*3/uL (ref 0.7–4.0)
MCH: 29.6 pg (ref 26.0–34.0)
MCHC: 34 g/dL (ref 30.0–36.0)
MCV: 87.1 fL (ref 78.0–100.0)
MONO ABS: 0.5 10*3/uL (ref 0.1–1.0)
Monocytes Relative: 7 %
NEUTROS ABS: 3.1 10*3/uL (ref 1.7–7.7)
Neutrophils Relative %: 42 %
Platelets: 218 10*3/uL (ref 150–400)
RBC: 4.49 MIL/uL (ref 3.87–5.11)
RDW: 12.2 % (ref 11.5–15.5)
WBC: 7.4 10*3/uL (ref 4.0–10.5)

## 2017-01-12 LAB — COMPREHENSIVE METABOLIC PANEL
ALBUMIN: 4.1 g/dL (ref 3.5–5.0)
ALT: 22 U/L (ref 14–54)
ANION GAP: 10 (ref 5–15)
AST: 26 U/L (ref 15–41)
Alkaline Phosphatase: 57 U/L (ref 38–126)
BILIRUBIN TOTAL: 0.7 mg/dL (ref 0.3–1.2)
BUN: 6 mg/dL (ref 6–20)
CO2: 25 mmol/L (ref 22–32)
Calcium: 8.9 mg/dL (ref 8.9–10.3)
Chloride: 101 mmol/L (ref 101–111)
Creatinine, Ser: 0.59 mg/dL (ref 0.44–1.00)
GFR calc Af Amer: >60 mL/min (ref >60)
GFR calc non Af Amer: >60 mL/min (ref >60)
GLUCOSE: 105 mg/dL — AB (ref 65–99)
POTASSIUM: 3 mmol/L — AB (ref 3.5–5.1)
SODIUM: 136 mmol/L (ref 135–145)
TOTAL PROTEIN: 7 g/dL (ref 6.5–8.1)

## 2017-01-12 LAB — I-STAT BETA HCG BLOOD, ED (MC, WL, AP ONLY): I-stat hCG, quantitative: <5 m[IU]/mL (ref <5)

## 2017-01-12 LAB — I-STAT TROPONIN, ED: TROPONIN I, POC: 0 ng/mL (ref 0.00–0.08)

## 2017-01-12 LAB — POCT URINE PREGNANCY: PREG TEST UR: NEGATIVE

## 2017-01-12 MED ORDER — CLINDAMYCIN HCL 300 MG PO CAPS
300.0000 mg | ORAL_CAPSULE | Freq: Three times a day (TID) | ORAL | 2 refills | Status: DC
Start: 2017-01-12 — End: 2017-07-09

## 2017-01-12 NOTE — ED Triage Notes (Addendum)
Patient complaining of mid chest pain without any other symptoms. Patient has not had any injury to chest. Patient states she has had this pain before.

## 2017-01-12 NOTE — Progress Notes (Signed)
Patient ID: Risha Lorenzen, female   DOB: [birth year], 26 y.o.   MRN: [id number]  Chief Complaint  Patient presents with  . Gynecologic Exam    HPI Doyle Askelson is a 26 y.o. female.  History of chronic cyclic lower abdominal pain since having a C/S.  The pain occurs with and intensifies with menstruation, and goes away at the end of period.  MRI shows a rectus muscle mass c/w an endometrioma.  Ultrasound directed core biopsy is c/w endometriosis. HPI  Past Medical History:  Diagnosis Date  . BV (bacterial vaginosis)   . Hypertension     Past Surgical History:  Procedure Laterality Date  . CESAREAN SECTION  Oct 21, 2009    Family History  Problem Relation Age of Onset  . Hypertension Mother   . Diabetes      Social History Social History  Substance Use Topics  . Smoking status: Current Some Day Smoker    Packs/day: 0.20    Years: 3.00    Types: Cigarettes  . Smokeless tobacco: Never Used     Comment: smokes socially  . Alcohol use 0.0 oz/week     Comment: socially    Allergies  Allergen Reactions  . Flagyl [Metronidazole] Itching, Swelling and Rash    Current Outpatient Prescriptions  Medication Sig Dispense Refill  . carvedilol (COREG) 3.125 MG tablet Take 2 tablets (6.25 mg total) by mouth 2 (two) times daily with a meal. (Patient taking differently: Take 3.125 mg by mouth daily. ) 120 tablet 3  . clindamycin (CLEOCIN) 300 MG capsule Take 1 capsule (300 mg total) by mouth 3 (three) times daily. 21 capsule 2  . hydrochlorothiazide (HYDRODIURIL) 25 MG tablet Take 1 tablet (25 mg total) by mouth daily. 60 tablet 3   No current facility-administered medications for this visit.     Review of Systems Review of Systems Constitutional: negative for fatigue and weight loss Respiratory: negative for cough and wheezing Cardiovascular: negative for chest pain, fatigue and palpitations Gastrointestinal: negative for abdominal pain and change in bowel  habits Genitourinary:positive for lower abdominal mass that is painful during menstruation Integument/breast: negative for nipple discharge Musculoskeletal:negative for myalgias Neurological: negative for gait problems and tremors Behavioral/Psych: negative for abusive relationship, depression Endocrine: negative for temperature intolerance      Blood pressure (!) 150/87, pulse 71, weight 185 lb (83.9 kg), last menstrual period 11/25/2016.  Physical Exam Physical Exam General:   alert  Skin:   no rash or abnormalities  Lungs:   clear to auscultation bilaterally  Heart:   regular rate and rhythm, S1, S2 normal, no murmur, click, rub or gallop  Breasts:   normal without suspicious masses, skin or nipple changes or axillary nodes  Abdomen:  palpable soft tissue mass RLQ  Pelvis:  External genitalia: normal general appearance Urinary system: urethral meatus normal and bladder without fullness, nontender Vaginal: normal without tenderness, induration or masses Cervix: normal appearance Adnexa: normal bimanual exam Uterus: anteverted and non-tender, normal size    50% of 15 min visit spent on counseling and coordination of care.    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 These patients respond well to surgical exploration and removal with complete cessation of symptoms.  However, surgery may be difficult  and involve mesh placement with closure.  I would therefore defer to General Surgery for further management.  Orders Placed This Encounter  Procedures  . Urine culture  . Ambulatory referral to General Surgery    Referral Priority:   Routine    Referral Type:   Surgical    Referral Reason:   Specialty Services Required     Requested Specialty:   General Surgery    Number of Visits Requested:   1  . POCT urine pregnancy   Meds ordered this encounter  Medications  . clindamycin (CLEOCIN) 300 MG capsule    Sig: Take 1 capsule (300 mg total) by mouth 3 (three) times daily.    Dispense:  21 capsule    Refill:  2           Patient ID: Clyde Lundborg, female   DOB: 10-25-1991, 26 y.o.   MRN: [id number]

## 2017-01-12 NOTE — Progress Notes (Signed)
Patient request STD check,UPT and consult. Patient has endometriosis and wants to discuss treatment.

## 2017-01-12 NOTE — ED Provider Notes (Signed)
[facility] DEPT Provider Note   CSN: [id number] Arrival date & time: 01/12/17  0245     History   Chief Complaint Chief Complaint  Patient presents with  . Chest Pain    HPI Amy Snyder is a 26 y.o. female.  Patient presents with recurrent chest pain that has been on and off for the past 3-4 months. Pain started tonight while talking on the phone. She denies high stress or history of anxiety. No pain currently. No cough, fever, vomiting, SOB.   The history is provided by the patient. No language interpreter was used.  Chest Pain   Pertinent negatives include no abdominal pain, no cough, no fever, no nausea and no shortness of breath.    Past Medical History:  Diagnosis Date  . BV (bacterial vaginosis)   . Hypertension     Patient Active Problem List   Diagnosis Date Noted  . Abnormal Pap smear of cervix 09/26/2016  . Headache 08/27/2016  . Essential hypertension, benign 07/03/2014  . Candida vaginitis 01/21/2014  . 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 Excessive or frequent menstruation 08/02/2013    Past Surgical History:  Procedure Laterality Date  . CESAREAN SECTION  Oct 21, 2009    OB History    Gravida Para Term Preterm AB Living   1 1   1   1    SAB TAB Ectopic Multiple Live Births           1       Home Medications    Prior to Admission medications   Medication Sig Start Date End Date Taking? Authorizing Provider  carvedilol (COREG) 3.125 MG tablet Take 2 tablets (6.25 mg total) by mouth 2 (two) times daily with a meal. Patient taking differently: Take 3.125 mg by mouth daily.  08/19/16   Katheren Shams, DO  clindamycin (CLEOCIN) 300 MG capsule Take 1 capsule (300 mg total) by mouth 2 (two) times daily. 10/12/16   Shelly Bombard, MD  hydrochlorothiazide (HYDRODIURIL) 25 MG tablet Take 1 tablet (25 mg total) by mouth daily. 08/19/16   Katheren Shams, DO    Family History Family History  Problem Relation Age of Onset  . Hypertension  Mother   . Diabetes      Social History Social History  Substance Use Topics  . Smoking status: Current Some Day Smoker    Packs/day: 0.20    Years: 3.00    Types: Cigarettes  . Smokeless tobacco: Never Used     Comment: smokes socially  . Alcohol use 0.0 oz/week     Comment: socially     Allergies   Flagyl [metronidazole]   Review of Systems Review of Systems  Constitutional: Negative for chills and fever.  HENT: Negative.   Respiratory: Negative.  Negative for cough and shortness of breath.   Cardiovascular: Positive for chest pain.  Gastrointestinal: Negative.  Negative for abdominal pain and nausea.  Musculoskeletal: Negative.   Skin: Negative.   Neurological: Negative.      Physical Exam Updated Vital Signs BP 145/89 (BP Location: Right Arm)   Pulse 64   Temp 98.3 F (36.8 C) (Oral)   Resp 18   Ht 5\' 7"  (1.702 m)   Wt 81.6 kg   LMP 11/24/2016   SpO2 100%   BMI 28.19 kg/m   Physical Exam  Constitutional: She is oriented to person, place, and time. She appears well-developed and well-nourished.  HENT:  Head: Normocephalic.  Neck: Normal range of motion.  Neck supple.  Cardiovascular: Normal rate and regular rhythm.   No murmur heard. Pulmonary/Chest: Effort normal and breath sounds normal. She has no wheezes. She has no rales. She exhibits no tenderness.  Abdominal: Soft. Bowel sounds are normal. There is no tenderness. There is no rebound and no guarding.  Musculoskeletal: Normal range of motion.  Neurological: She is alert and oriented to person, place, and time.  Skin: Skin is warm and dry. No rash noted.  Psychiatric: She has a normal mood and affect.     ED Treatments / Results  Labs (all labs ordered are listed, but only abnormal results are displayed) Labs Reviewed  COMPREHENSIVE METABOLIC PANEL - Abnormal; Notable for the following:       Result Value   Potassium 3.0 (*)    Glucose, Bld 105 (*)    All other components within normal  limits  CBC WITH DIFFERENTIAL/PLATELET  I-STAT TROPOININ, ED  I-STAT BETA HCG BLOOD, ED ([facility], [facility], [facility] ONLY)   Results for orders placed or performed during the hospital encounter of 01/12/17  Comprehensive metabolic panel  Result Value Ref Range   Sodium 136 135 - 145 mmol/L   Potassium 3.0 (L) 3.5 - 5.1 mmol/L   Chloride 101 101 - 111 mmol/L   CO2 25 22 - 32 mmol/L   Glucose, Bld 105 (H) 65 - 99 mg/dL   BUN 6 6 - 20 mg/dL   Creatinine, Ser 0.59 0.44 - 1.00 mg/dL   Calcium 8.9 8.9 - 10.3 mg/dL   Total Protein 7.0 6.5 - 8.1 g/dL   Albumin 4.1 3.5 - 5.0 g/dL   AST 26 15 - 41 U/L   ALT 22 14 - 54 U/L   Alkaline Phosphatase 57 38 - 126 U/L   Total Bilirubin 0.7 0.3 - 1.2 mg/dL   GFR calc non Af Amer >60 >60 mL/min   GFR calc Af Amer >60 >60 mL/min   Anion gap 10 5 - 15  CBC with Differential  Result Value Ref Range   WBC 7.4 4.0 - 10.5 K/uL   RBC 4.49 3.87 - 5.11 MIL/uL   Hemoglobin 13.3 12.0 - 15.0 g/dL   HCT 39.1 36.0 - 46.0 %   MCV 87.1 78.0 - 100.0 fL   MCH 29.6 26.0 - 34.0 pg   MCHC 34.0 30.0 - 36.0 g/dL   RDW 12.2 11.5 - 15.5 %   Platelets 218 150 - 400 K/uL   Neutrophils Relative % 42 %   Neutro Abs 3.1 1.7 - 7.7 K/uL   Lymphocytes Relative 47 %   Lymphs Abs 3.5 0.7 - 4.0 K/uL   Monocytes Relative 7 %   Monocytes Absolute 0.5 0.1 - 1.0 K/uL   Eosinophils Relative 4 %   Eosinophils Absolute 0.3 0.0 - 0.7 K/uL   Basophils Relative 0 %   Basophils Absolute 0.0 0.0 - 0.1 K/uL  I-stat troponin, ED  Result Value Ref Range   Troponin i, poc 0.00 0.00 - 0.08 ng/mL   Comment 3          I-Stat beta hCG blood, ED ([facility], [facility], [facility] only)  Result Value Ref Range   I-stat hCG, quantitative <5.0 <5 mIU/mL   Comment 3            EKG  EKG Interpretation  Date/Time:  Wednesday January 12 2017 02:55:03 EST Ventricular Rate:  62 PR Interval:    QRS Duration: 100 QT Interval:  386 QTC Calculation: 392 R Axis:  94 Text Interpretation:  Sinus rhythm Borderline right axis  deviation Borderline repolarization abnormality Baseline wander in lead(s) V2 Confirmed by Dina Rich  MD, COURTNEY ([id number]) on 01/12/2017 4:04:02 AM       Radiology Dg Chest 2 View  Result Date: 01/12/2017 CLINICAL DATA:  26 year old female with chest pain. EXAM: CHEST  2 VIEW COMPARISON:  Chest radiograph dated 10/30/2016 FINDINGS: The heart size and mediastinal contours are within normal limits. Both lungs are clear. The visualized skeletal structures are unremarkable. IMPRESSION: No active cardiopulmonary disease. Electronically Signed   By: Anner Crete M.D.   On: 01/12/2017 03:19    Procedures Procedures (including critical care time)  Medications Ordered in ED Medications - No data to display   Initial Impression / Assessment and Plan / ED Course  I have reviewed the triage vital signs and the nursing notes.  Pertinent labs & imaging results that were available during my care of the patient were reviewed by me and considered in my medical decision making (see chart for details).     Patient with recurrent chest pain. Negative evaluation including troponin here. Pain as been recurrent for <2 months. No pain currently.  She can be discharged home with PCP follow up for further evaluation.  Final Clinical Impressions(s) / ED Diagnoses   Final diagnoses:  None   1. Chest pain, nonspecific  New Prescriptions New Prescriptions   No medications on file     Charlann Lange, PA-C 01/12/17 Dawson, MD 01/12/17 [phone]

## 2017-01-13 LAB — CERVICOVAGINAL ANCILLARY ONLY
BACTERIAL VAGINITIS: NEGATIVE
CHLAMYDIA, DNA PROBE: NEGATIVE
Candida vaginitis: POSITIVE — AB
NEISSERIA GONORRHEA: NEGATIVE
Trichomonas: NEGATIVE

## 2017-01-14 ENCOUNTER — Other Ambulatory Visit: Payer: Self-pay | Admitting: Obstetrics

## 2017-01-14 DIAGNOSIS — B9689 Other specified bacterial agents as the cause of diseases classified elsewhere: Secondary | ICD-10-CM

## 2017-01-14 DIAGNOSIS — N760 Acute vaginitis: Principal | ICD-10-CM

## 2017-01-14 MED ORDER — FLUCONAZOLE 150 MG PO TABS: 150.0000 mg | ORAL_TABLET | Freq: Once | ORAL | 2 refills | Status: AC

## 2017-01-17 LAB — URINE CULTURE

## 2017-01-18 ENCOUNTER — Other Ambulatory Visit: Payer: Self-pay | Admitting: Obstetrics

## 2017-01-18 DIAGNOSIS — N3000 Acute cystitis without hematuria: Secondary | ICD-10-CM

## 2017-01-18 MED ORDER — CEFUROXIME AXETIL 500 MG PO TABS: 500.0000 mg | ORAL_TABLET | Freq: Two times a day (BID) | ORAL | 0 refills | Status: DC

## 2017-02-08 ENCOUNTER — Telehealth: Payer: Self-pay

## 2017-02-08 NOTE — Telephone Encounter (Signed)
Patient called in requesting note to be out of work today in relation to abdominal pain from ongoing diagnosis. Provider approved letter and patient stated that she has upcoming appt for surgery consult on 02-16-17.

## 2017-03-23 ENCOUNTER — Telehealth: Payer: Self-pay | Admitting: *Deleted

## 2017-03-23 NOTE — Telephone Encounter (Signed)
Pt called office needing letter for being out of work due to endometriosis pain.  Attempt to contact pt to discuss.  LM on VM to call office.

## 2017-06-01 ENCOUNTER — Ambulatory Visit (INDEPENDENT_AMBULATORY_CARE_PROVIDER_SITE_OTHER): Payer: Medicaid Other | Admitting: Family Medicine

## 2017-06-01 ENCOUNTER — Encounter: Payer: Self-pay | Admitting: Family Medicine

## 2017-06-01 ENCOUNTER — Other Ambulatory Visit (HOSPITAL_COMMUNITY)
Admission: RE | Admit: 2017-06-01 | Discharge: 2017-06-01 | Disposition: A | Discharge status: Home / Self Care | Payer: Medicaid Other | Source: Ambulatory Visit | Attending: Family Medicine | Admitting: Family Medicine

## 2017-06-01 VITALS — BP 110/84 | HR 70 | Temp 97.8°F | Ht 67.0 in | Wt 186.6 lb

## 2017-06-01 DIAGNOSIS — N898 Other specified noninflammatory disorders of vagina: Secondary | ICD-10-CM

## 2017-06-01 DIAGNOSIS — Z202 Contact with and (suspected) exposure to infections with a predominantly sexual mode of transmission: Secondary | ICD-10-CM | POA: Insufficient documentation

## 2017-06-01 DIAGNOSIS — L298 Other pruritus: Secondary | ICD-10-CM | POA: Diagnosis not present

## 2017-06-01 LAB — POCT WET PREP (WET MOUNT)
CLUE CELLS WET PREP WHIFF POC: NEGATIVE
Trichomonas Wet Prep HPF POC: ABSENT

## 2017-06-01 MED ORDER — FLUCONAZOLE 150 MG PO TABS: 150.0000 mg | ORAL_TABLET | Freq: Once | ORAL | 0 refills | Status: AC

## 2017-06-01 NOTE — Progress Notes (Signed)
Subjective:     [name], female   DOB: 01/12/1991, 26 y.o.   MRN: [id number]  HPI Amy Snyder is a 26 year old female presenting today for concern of STD or yeast infection. Reports history of vaginal itching and irritation for the last several days.Reports this feels similar to previous yeast infections. Denies vaginal discharge. Denies dysuria. Denies urinary frequency. Does note occasional urgency. Reports she is sexually active and does not use protection. Does not use birth control but refuses pregnancy test today, stating she knows she is not pregnant. Would like STD screening today to include blood and vaginal swabs. Up-to-date on Pap smear. Smoker  Review of Systems Per HPI    Objective:   Physical Exam  Constitutional: She appears well-developed and well-nourished. No distress.  Cardiovascular: Normal rate.   Pulmonary/Chest: Effort normal. No respiratory distress.  Genitourinary:  Genitourinary Comments: No genital ulcers noted. White vaginal discharge. No adnexal tenderness. No cervical motion tenderness. No vaginal wall tenderness.  Psychiatric: She has a normal mood and affect. Her behavior is normal.      Assessment and Plan:     1. Vaginal itching Wet prep obtained and shows moderate yeast. Prescription for Diflucan sent to pharmacy. Screening for gonorrhea, chlamydia, HIV, and RPR obtained. Refuses pregnancy test. Does not wish to discuss contraception. Handout on safe sex practices given. Follow-up as needed.

## 2017-06-01 NOTE — Patient Instructions (Signed)
We will do several tests today. Some will return later today (Wet Prep), while the rest will take several days.   Safe Sex Practicing safe sex means taking steps before and during sex to reduce your risk of:  Getting an STD (sexually transmitted disease).  Giving your partner an STD.  Unwanted pregnancy.  How can I practice safe sex?  To practice safe sex:  Limit your sexual partners to only one partner who is having sex with only you.  Avoid using alcohol and recreational drugs before having sex. These substances can affect your judgment.  Before having sex with a new partner: ? Talk to your partner about past partners, past STDs, and drug use. ? You and your partner should be screened for STDs and discuss the results with each other.  Check your body regularly for sores, blisters, rashes, or unusual discharge. If you notice any of these problems, visit your health care provider.  If you have symptoms of an infection or you are being treated for an STD, avoid sexual contact.  While having sex, use a condom. Make sure to: ? Use a condom every time you have vaginal, oral, or anal sex. Both females and males should wear condoms during oral sex. ? Keep condoms in place from the beginning to the end of sexual activity. ? Use a latex condom, if possible. Latex condoms offer the best protection. ? Use only water-based lubricants or oils to lubricate a condom. Using petroleum-based lubricants or oils will weaken the condom and increase the chance that it will break.  See your health care provider for regular screenings, exams, and tests for STDs.  Talk with your health care provider about the form of birth control (contraception) that is best for you.  Get vaccinated against hepatitis B and human papillomavirus (HPV).  If you are at risk of being infected with HIV (human immunodeficiency virus), talk with your health care provider about taking a prescription medicine to prevent HIV  infection. You are considered at risk for HIV if: ? You are a man who has sex with other men. ? You are a heterosexual man or woman who is sexually active with more than one partner. ? You take drugs by injection. ? You are sexually active with a partner who has HIV.  This information is not intended to replace advice given to you by your health care provider. Make sure you discuss any questions you have with your health care provider. Document Released: 01/13/2005 Document Revised: 04/21/2016 Document Reviewed: 10/26/2015 Elsevier Interactive Patient Education  [employer].

## 2017-06-02 ENCOUNTER — Telehealth: Payer: Self-pay | Admitting: Family Medicine

## 2017-06-02 LAB — HIV ANTIBODY (ROUTINE TESTING W REFLEX): HIV SCREEN 4TH GENERATION: NONREACTIVE

## 2017-06-02 LAB — CERVICOVAGINAL ANCILLARY ONLY
CHLAMYDIA, DNA PROBE: NEGATIVE
NEISSERIA GONORRHEA: NEGATIVE

## 2017-06-02 LAB — RPR: RPR: NONREACTIVE

## 2017-06-02 NOTE — Telephone Encounter (Signed)
Contacted Amy Snyder with remainder of lab results.

## 2017-07-08 ENCOUNTER — Emergency Department (HOSPITAL_COMMUNITY)
Admission: EM | Admit: 2017-07-08 | Discharge: 2017-07-08 | Disposition: A | Discharge status: Home / Self Care | Payer: Medicaid Other | Attending: Emergency Medicine | Admitting: Emergency Medicine

## 2017-07-08 DIAGNOSIS — R102 Pelvic and perineal pain: Secondary | ICD-10-CM | POA: Diagnosis not present

## 2017-07-08 DIAGNOSIS — N809 Endometriosis, unspecified: Secondary | ICD-10-CM | POA: Diagnosis not present

## 2017-07-08 DIAGNOSIS — F17210 Nicotine dependence, cigarettes, uncomplicated: Secondary | ICD-10-CM | POA: Insufficient documentation

## 2017-07-08 DIAGNOSIS — Z79899 Other long term (current) drug therapy: Secondary | ICD-10-CM | POA: Diagnosis not present

## 2017-07-08 DIAGNOSIS — I10 Essential (primary) hypertension: Secondary | ICD-10-CM | POA: Insufficient documentation

## 2017-07-08 MED ORDER — IBUPROFEN 600 MG PO TABS: 600.0000 mg | ORAL_TABLET | Freq: Four times a day (QID) | ORAL | 0 refills | Status: DC | PRN

## 2017-07-08 NOTE — ED Provider Notes (Signed)
[facility] DEPT Provider Note   CSN: [id number] Arrival date & time: 07/08/17  1207     History   Chief Complaint Chief Complaint  Patient presents with  . Endometriosis    HPI Amy Snyder is a 26 y.o. female with a history of endometriosis who presents with suprapelvic pain that she says is consistent with her normal pain from endometriosis.  She reports that she normally gets seen during her menses for pain medication.  She reports that this is exactly her normal pain From endometriosis.  She began her menstrual period 2 days ago. Says that she normally gets seen given a prescription for ibuprofen. She reports that it feels exactly the same in character, location, and severity. She denies any abnormal vaginal discharge/drainage.  No fevers or chills. She is requesting a prescription for ibuprofen and a work note today. She says that she does not want additional workup or evaluation, declines pelvic exam at this time.  HPI  Past Medical History:  Diagnosis Date  . BV (bacterial vaginosis)   . Hypertension     Patient Active Problem List   Diagnosis Date Noted  . Abnormal Pap smear of cervix 09/26/2016  . Headache 08/27/2016  . Essential hypertension, benign 07/03/2014  . Candida vaginitis 01/21/2014  . 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 Excessive or frequent menstruation 08/02/2013    Past Surgical History:  Procedure Laterality Date  . CESAREAN SECTION  Oct 21, 2009    OB History    Gravida Para Term Preterm AB Living   1 1   1   1    SAB TAB Ectopic Multiple Live Births           1       Home Medications    Prior to Admission medications   Medication Sig Start Date End Date Taking? Authorizing Provider  acetaminophen (TYLENOL) 500 MG tablet Take 1,000 mg by mouth every 6 (six) hours as needed for mild pain.   Yes [provider]  carvedilol (COREG) 3.125 MG tablet Take 2 tablets (6.25 mg total) by mouth 2 (two) times daily with a  meal. Patient taking differently: Take 3.125 mg by mouth daily.  08/19/16  Yes Luiz Blare Y, DO  hydrochlorothiazide (HYDRODIURIL) 25 MG tablet Take 1 tablet (25 mg total) by mouth daily. 08/19/16  Yes Luiz Blare Y, DO  cefUROXime (CEFTIN) 500 MG tablet Take 1 tablet (500 mg total) by mouth 2 (two) times daily with a meal. Patient not taking: Reported on 07/08/2017 01/18/17   Shelly Bombard, MD  clindamycin (CLEOCIN) 300 MG capsule Take 1 capsule (300 mg total) by mouth 3 (three) times daily. Patient not taking: Reported on 07/08/2017 01/12/17   Shelly Bombard, MD  ibuprofen (ADVIL,MOTRIN) 600 MG tablet Take 1 tablet (600 mg total) by mouth every 6 (six) hours as needed for mild pain, moderate pain or cramping. 07/08/17   Lorin Glass, PA-C    Family History Family History  Problem Relation Age of Onset  . Hypertension Mother   . Diabetes Unknown     Social History Social History  Substance Use Topics  . Smoking status: Current Some Day Smoker    Packs/day: 0.20    Years: 3.00    Types: Cigarettes  . Smokeless tobacco: Never Used     Comment: smokes socially  . Alcohol use 0.0 oz/week     Comment: socially     Allergies   Flagyl [metronidazole]   Review of  Systems Review of Systems  Constitutional: Negative for chills, fatigue and fever.  Genitourinary: Positive for pelvic pain (Normal for her) and vaginal bleeding (Consistent with her normal menstrual periods.). Negative for difficulty urinating, dysuria, flank pain, frequency, genital sores, hematuria, vaginal discharge and vaginal pain.  Skin: Negative for rash.  All other systems reviewed and are negative.    Physical Exam Updated Vital Signs BP (!) 153/107 (BP Location: Left Arm)   Pulse (!) 59   Temp 98.9 F (37.2 C) (Oral)   Resp 18   Ht 5\' 7"  (1.702 m)   Wt 81.6 kg (180 lb)   LMP 07/06/2017 (Exact Date)   SpO2 100%   BMI 28.19 kg/m   Physical Exam  Constitutional: She appears  well-developed and well-nourished. No distress.  HENT:  Head: Normocephalic and atraumatic.  Eyes: Conjunctivae are normal. Right eye exhibits no discharge. Left eye exhibits no discharge. No scleral icterus.  Neck: Normal range of motion.  Cardiovascular: Normal rate and regular rhythm.   Pulmonary/Chest: Effort normal. No stridor. No respiratory distress.  Abdominal: Soft. Bowel sounds are normal. She exhibits no distension. There is tenderness (Mild) in the suprapubic area. There is no rigidity, no guarding and no tenderness at McBurney's point.  Genitourinary:  Genitourinary Comments: Patient declined pelvic exam  Musculoskeletal: She exhibits no edema or deformity.  Neurological: She is alert. She exhibits normal muscle tone.  Skin: Skin is warm and dry. She is not diaphoretic.  Psychiatric: She has a normal mood and affect. Her behavior is normal.  Nursing note and vitals reviewed.    ED Treatments / Results  Labs (all labs ordered are listed, but only abnormal results are displayed) Labs Reviewed - No data to display  EKG  EKG Interpretation None       Radiology No results found.  Procedures Procedures (including critical care time)  Medications Ordered in ED Medications - No data to display   Initial Impression / Assessment and Plan / ED Course  I have reviewed the triage vital signs and the nursing notes.  Pertinent labs & imaging results that were available during my care of the patient were reviewed by me and considered in my medical decision making (see chart for details).     Amy Snyder presents with what she reports is her normal pelvic pain from endometriosis and is asking for a rx for ibuprofen and for a work note.  She denies any urinary symptoms, states she is not concerned about STDs, has no abnormal vaginal drainage/discharge. She was given the option for a pelvic exam with workup, however refused at this time, stating she understands that it  limits my ability to evaluate her for potentially life threatening or fertility threatening conditions, and if she has untreated conditions may lead to chronic pain or disability.  She was instructed to follow up with her OB/GYN regarding her visit today. She was given return precautions and stated her understanding.     At this time there does not appear to be any evidence of an acute emergency medical condition and the patient appears stable for discharge with appropriate outpatient follow up.Diagnosis was discussed with patient who verbalizes understanding and is agreeable to discharge.   Final Clinical Impressions(s) / ED Diagnoses   Final diagnoses:  Pelvic pain in female    New Prescriptions New Prescriptions   IBUPROFEN (ADVIL,MOTRIN) 600 MG TABLET    Take 1 tablet (600 mg total) by mouth every 6 (six) hours as needed for mild  pain, moderate pain or cramping.         Lorin Glass, PA-C 07/08/17 1652    Charlesetta Shanks, MD 07/09/17 1059

## 2017-07-08 NOTE — ED Triage Notes (Signed)
Pt states that she has endometriosis and she usually comes in for pain management when it flares up around her period. Alert and oriented.

## 2017-07-08 NOTE — ED Notes (Signed)
Patient states she has known endometriosis and states "I usually come here when I have the pain because I can get ibuprofen and a shot to make the pain ease up. The pain comes occasionally during my cycles and is constant. Dr. Jodi Mourning states that he does not want to do the surgery just yet because I am so young. States that he wanted her to take birth control to help but she hasn't tried it yet."

## 2017-07-08 NOTE — Discharge Instructions (Signed)
Please take Ibuprofen (Advil, motrin) and Tylenol (acetaminophen) to relieve your pain.  You may take up to 600 MG (3 pills) of normal strength ibuprofen every 8 hours as needed.  In between doses of ibuprofen you make take tylenol, up to 1,000 mg (two extra strength pills).  Do not take more than 3,000 mg tylenol in a 24 hour period.  Please check all medication labels as many medications such as pain and cold medications may contain tylenol.  Do not drink alcohol while taking these medications.  Do not take other NSAID'S while taking ibuprofen (such as aleve or naproxen).  Please take ibuprofen with food to decrease stomach upset.  Please seek additional medical evaluation if you develop fevers, nausea, vomiting, or chills, vaginal discharge or have any concerns.  Please follow up with your OB.

## 2017-08-16 ENCOUNTER — Ambulatory Visit (INDEPENDENT_AMBULATORY_CARE_PROVIDER_SITE_OTHER): Payer: Medicaid Other | Admitting: Obstetrics

## 2017-08-16 ENCOUNTER — Other Ambulatory Visit (HOSPITAL_COMMUNITY)
Admission: RE | Admit: 2017-08-16 | Discharge: 2017-08-16 | Disposition: A | Discharge status: Home / Self Care | Payer: Medicaid Other | Source: Ambulatory Visit | Attending: Obstetrics | Admitting: Obstetrics

## 2017-08-16 ENCOUNTER — Encounter: Payer: Self-pay | Admitting: Obstetrics

## 2017-08-16 VITALS — BP 147/92 | HR 74 | Wt 186.0 lb

## 2017-08-16 DIAGNOSIS — N898 Other specified noninflammatory disorders of vagina: Secondary | ICD-10-CM

## 2017-08-16 NOTE — Progress Notes (Signed)
Patient reports she has had a discharge for a couple days and she would like to get it checked.

## 2017-08-16 NOTE — Progress Notes (Signed)
[name], female   DOB: 12-Dec-1991, 26 y.o.   MRN: [id number]  Chief Complaint  Patient presents with  . Gynecologic Exam    HPI Amy Snyder is a 26 y.o. female.  Vaginal discharge with odor for past week.  Denies vaginal itching. HPI  Past Medical History:  Diagnosis Date  . BV (bacterial vaginosis)   . Hypertension     Past Surgical History:  Procedure Laterality Date  . CESAREAN SECTION  Oct 21, 2009    Family History  Problem Relation Age of Onset  . Hypertension Mother   . Diabetes Unknown     Social History Social History  Substance Use Topics  . Smoking status: Current Some Day Smoker    Packs/day: 0.20    Years: 3.00    Types: Cigarettes  . Smokeless tobacco: Never Used     Comment: smokes socially  . Alcohol use No     Comment: socially    Allergies  Allergen Reactions  . Flagyl [Metronidazole] Itching, Swelling and Rash    Current Outpatient Prescriptions  Medication Sig Dispense Refill  . acetaminophen (TYLENOL) 500 MG tablet Take 1,000 mg by mouth every 6 (six) hours as needed for mild pain.    . carvedilol (COREG) 3.125 MG tablet Take 2 tablets (6.25 mg total) by mouth 2 (two) times daily with a meal. (Patient taking differently: Take 3.125 mg by mouth daily. ) 120 tablet 3  . hydrochlorothiazide (HYDRODIURIL) 25 MG tablet Take 1 tablet (25 mg total) by mouth daily. 60 tablet 3  . ibuprofen (ADVIL,MOTRIN) 600 MG tablet Take 1 tablet (600 mg total) by mouth every 6 (six) hours as needed for mild pain, moderate pain or cramping. 30 tablet 0   No current facility-administered medications for this visit.     Review of Systems Review of Systems Constitutional: negative for fatigue and weight loss Respiratory: negative for cough and wheezing Cardiovascular: negative for chest pain, fatigue and palpitations Gastrointestinal: negative for abdominal pain and change in bowel habits Genitourinary:positive for malodorous vaginal  discharge Integument/breast: negative for nipple discharge Musculoskeletal:negative for myalgias Neurological: negative for gait problems and tremors Behavioral/Psych: negative for abusive relationship, depression Endocrine: negative for temperature intolerance      Blood pressure (!) 147/92, pulse 74, weight 186 lb (84.4 kg), last menstrual period 07/28/2017.  Physical Exam Physical Exam           General:  Alert and no distress Abdomen:  normal findings: no organomegaly, soft, non-tender and no hernia  Pelvis:  External genitalia: normal general appearance Urinary system: urethral meatus normal and bladder without fullness, nontender Vaginal: normal without tenderness, induration or masses Cervix: normal appearance Adnexa: normal bimanual exam Uterus: anteverted and non-tender, normal size    50% of 15 min visit spent on counseling and coordination of care.    Data Reviewed Labs  Assessment     1. Vaginal discharge Rx: - Cervicovaginal ancillary only    Plan    Follow up in 3 months for Annual  No orders of the defined types were placed in this encounter.  No orders of the defined types were placed in this encounter.

## 2017-08-17 ENCOUNTER — Telehealth: Payer: Self-pay

## 2017-08-17 ENCOUNTER — Other Ambulatory Visit: Payer: Self-pay | Admitting: Obstetrics

## 2017-08-17 DIAGNOSIS — N760 Acute vaginitis: Principal | ICD-10-CM

## 2017-08-17 DIAGNOSIS — B9689 Other specified bacterial agents as the cause of diseases classified elsewhere: Secondary | ICD-10-CM

## 2017-08-17 LAB — CERVICOVAGINAL ANCILLARY ONLY
Bacterial vaginitis: POSITIVE — AB
CHLAMYDIA, DNA PROBE: NEGATIVE
Candida vaginitis: NEGATIVE
NEISSERIA GONORRHEA: NEGATIVE
Trichomonas: NEGATIVE

## 2017-08-17 MED ORDER — CLINDAMYCIN HCL 300 MG PO CAPS: 300.0000 mg | ORAL_CAPSULE | Freq: Three times a day (TID) | ORAL | 0 refills | Status: DC

## 2017-08-17 NOTE — Telephone Encounter (Signed)
Advised of results and rx sent 

## 2017-10-12 ENCOUNTER — Other Ambulatory Visit: Payer: Self-pay | Admitting: Obstetrics and Gynecology

## 2017-10-14 ENCOUNTER — Ambulatory Visit: Payer: Self-pay | Admitting: Family Medicine

## 2017-10-14 ENCOUNTER — Ambulatory Visit (INDEPENDENT_AMBULATORY_CARE_PROVIDER_SITE_OTHER): Payer: Medicaid Other | Admitting: Family Medicine

## 2017-10-14 ENCOUNTER — Encounter: Payer: Self-pay | Admitting: Family Medicine

## 2017-10-14 VITALS — BP 134/90 | HR 72 | Temp 98.3°F | Ht 67.0 in | Wt 184.0 lb

## 2017-10-14 DIAGNOSIS — M79671 Pain in right foot: Secondary | ICD-10-CM | POA: Diagnosis not present

## 2017-10-14 DIAGNOSIS — B9689 Other specified bacterial agents as the cause of diseases classified elsewhere: Secondary | ICD-10-CM

## 2017-10-14 DIAGNOSIS — M79672 Pain in left foot: Secondary | ICD-10-CM

## 2017-10-14 DIAGNOSIS — I10 Essential (primary) hypertension: Secondary | ICD-10-CM | POA: Diagnosis not present

## 2017-10-14 DIAGNOSIS — N760 Acute vaginitis: Secondary | ICD-10-CM

## 2017-10-14 MED ORDER — CLINDAMYCIN HCL 300 MG PO CAPS: 300.0000 mg | ORAL_CAPSULE | Freq: Three times a day (TID) | ORAL | 0 refills | Status: DC

## 2017-10-14 MED ORDER — CARVEDILOL 3.125 MG PO TABS: 3.1250 mg | ORAL_TABLET | Freq: Every day | ORAL | 0 refills | Status: DC

## 2017-10-14 NOTE — Assessment & Plan Note (Signed)
  Patient with frequent BV infections, allergic to flagyl  -rx given for clindamycin -follow up as needed

## 2017-10-14 NOTE — Assessment & Plan Note (Addendum)
  Chronic. On HCTZ and coreg. Well controlled today, patient without any concerning symptoms  -continue current regimen -refilled coreg -follow up 3 months

## 2017-10-14 NOTE — Progress Notes (Signed)
    Subjective:    [name], female    DOB: 04/28/1991, 26 y.o.   MRN: [id number]   CC: feet pain/HTN  HTN- doing well, taking HCTZ and coreg daily. Ran out of HCTZ several days ago. Denies any chest pain, shortness of breath, blurred vision, headaches, DOE.   Feet pain- reports she switched jobs to [employer] and wears "cheap shoes". Her feet swell intermittently especially after a long shift. She endorses pain that is aching mostly in her heels. She denies numbness or tingling in her feet. No back or leg pain.   BV- reports she has BV symptoms and gets frequent infections. She adamantly declines pelvic exam today. She has had a reaction to flagyl before and takes clinda for BV.   Smoking status reviewed- current smoker  Review of Systems- see HPI   Objective:  BP 134/90   Pulse 72   Temp 98.3 F (36.8 C) (Oral)   Ht 5\' 7"  (1.702 m)   Wt 184 lb (83.5 kg)   LMP 10/03/2017 (Approximate)   BMI 28.82 kg/m  Vitals and nursing note reviewed  General: well nourished, in no acute distress Cardiac: RRR, clear S1 and S2, no murmurs, rubs, or gallops Respiratory: clear to auscultation bilaterally, no increased work of breathing Abdomen: soft, nontender, nondistended, no masses or organomegaly. Bowel sounds present Extremities: no edema or cyanosis noted. +2 DP pulses bilaterally, no deformity of feet noted, non tender to palpation, normal ROM of ankle Skin: warm and dry, no rashes noted Neuro: alert and oriented, no focal deficits   Assessment & Plan:    Essential hypertension, benign  Chronic. On HCTZ and coreg. Well controlled today, patient without any concerning symptoms  -continue current regimen -refilled coreg -follow up 3 months  Pain in both feet  Chronic for past year. Most consistent with pain from standing at work in poor shoes  -advised compression socks for dependent edema -encouraged patient to get supportive shoes -follow up as needed    BV (bacterial vaginosis)  Patient with frequent BV infections, allergic to flagyl  -rx given for clindamycin -follow up as needed     Return in about 3 months (around 01/14/2018).   Lucila Maine, DO Family Medicine Resident PGY-2

## 2017-10-14 NOTE — Patient Instructions (Addendum)
    It was nice to see you today.  Please get compression socks to wear to work and good supportive shoes!  Please continue taking blood pressure medications. I'd like to see you back in 3 months for recheck.  If you have questions or concerns please do not hesitate to call at [phone].  Lucila Maine, DO PGY-2, [facility] 10/14/2017 3:43 PM

## 2017-10-14 NOTE — Assessment & Plan Note (Addendum)
  Chronic for past year. Most consistent with pain from standing at work in poor shoes  -advised compression socks for dependent edema -encouraged patient to get supportive shoes -follow up as needed

## 2017-11-25 ENCOUNTER — Ambulatory Visit: Payer: Medicaid Other | Admitting: Family Medicine

## 2017-12-02 IMAGING — DX DG CHEST 2V
2 series · 2 of 2 positions shown · non-contrast
Comparison: None.

CLINICAL DATA: pt was just started on bp med in the past few days.
The pt took her bp med at work around 5755 and after that she
started having chest pain. She has chest pain now.

EXAM:
CHEST  2 VIEW

[w chest pa]
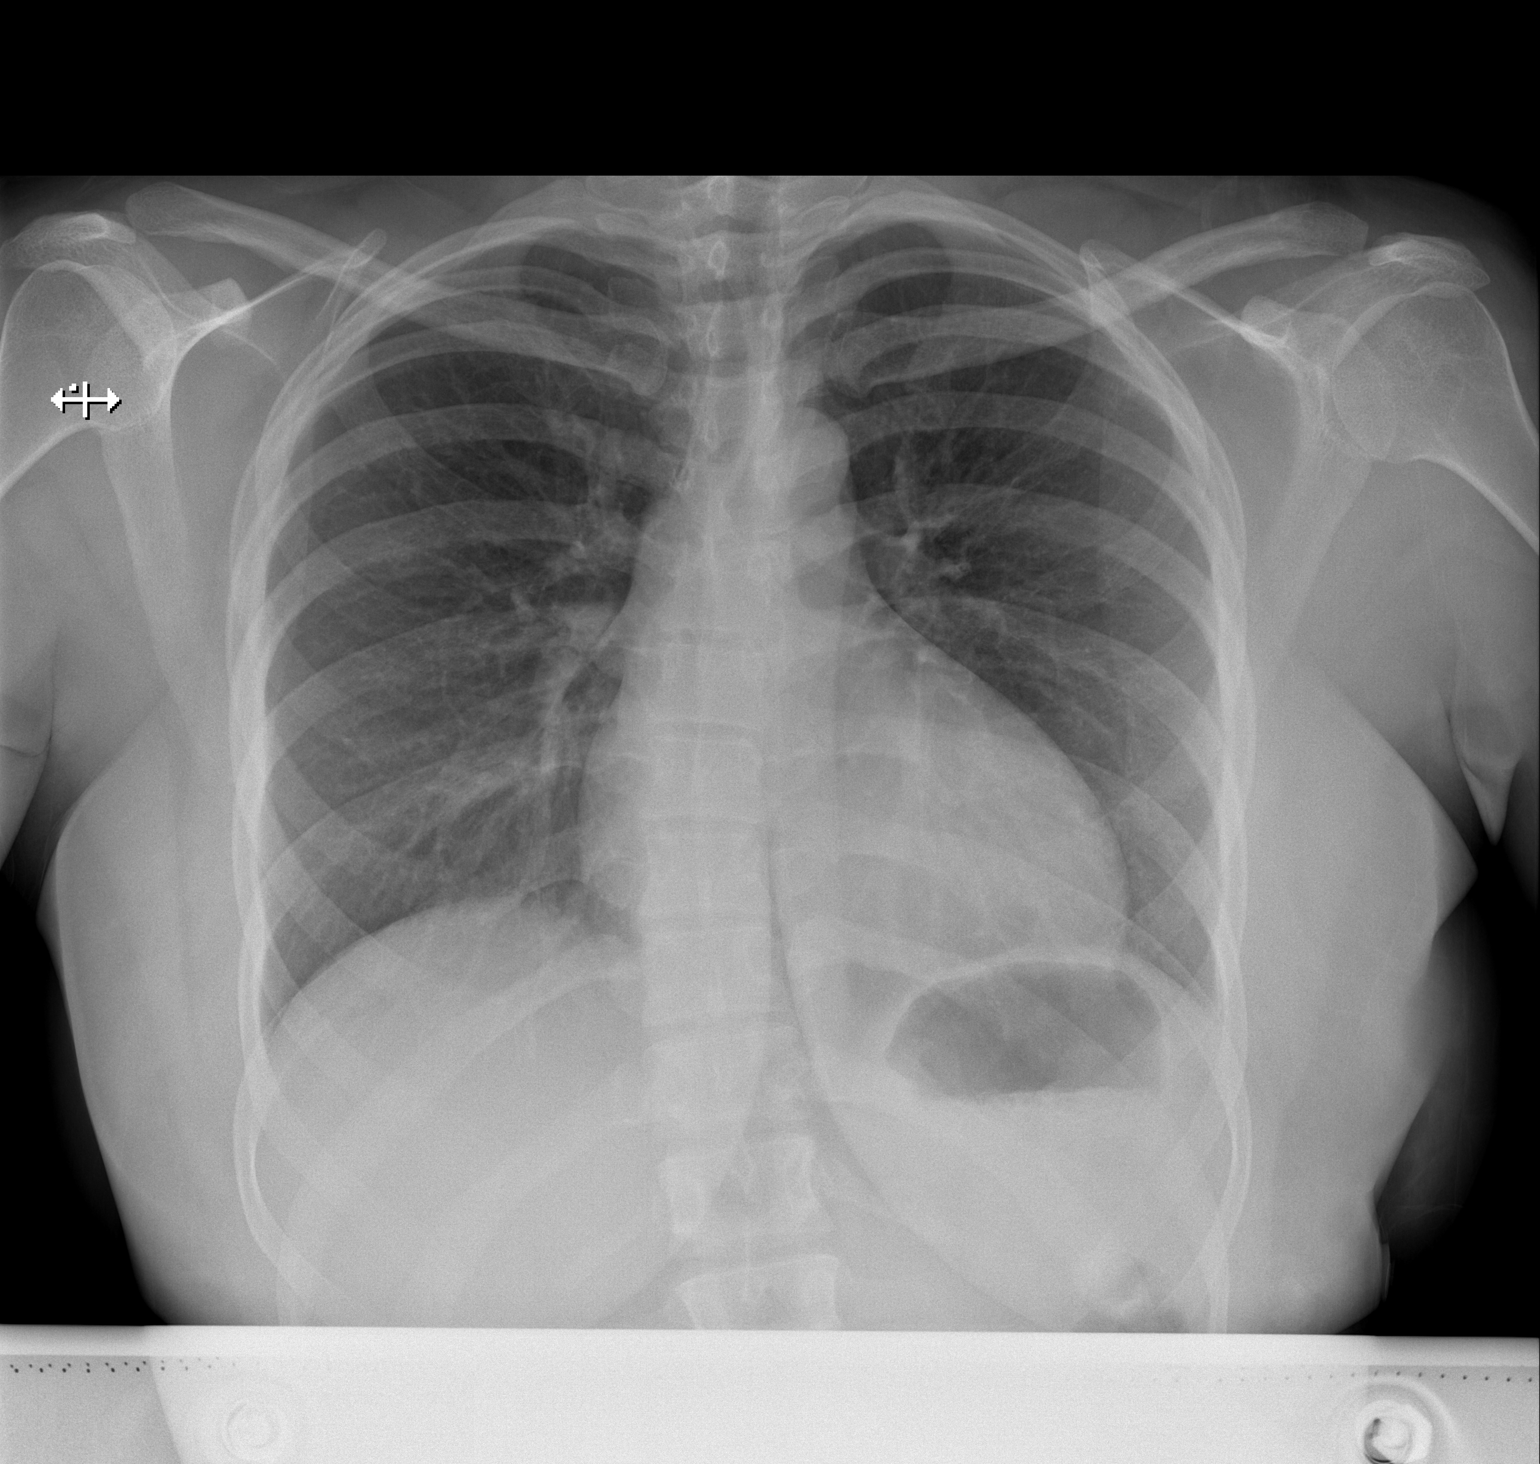

[w chest lat]
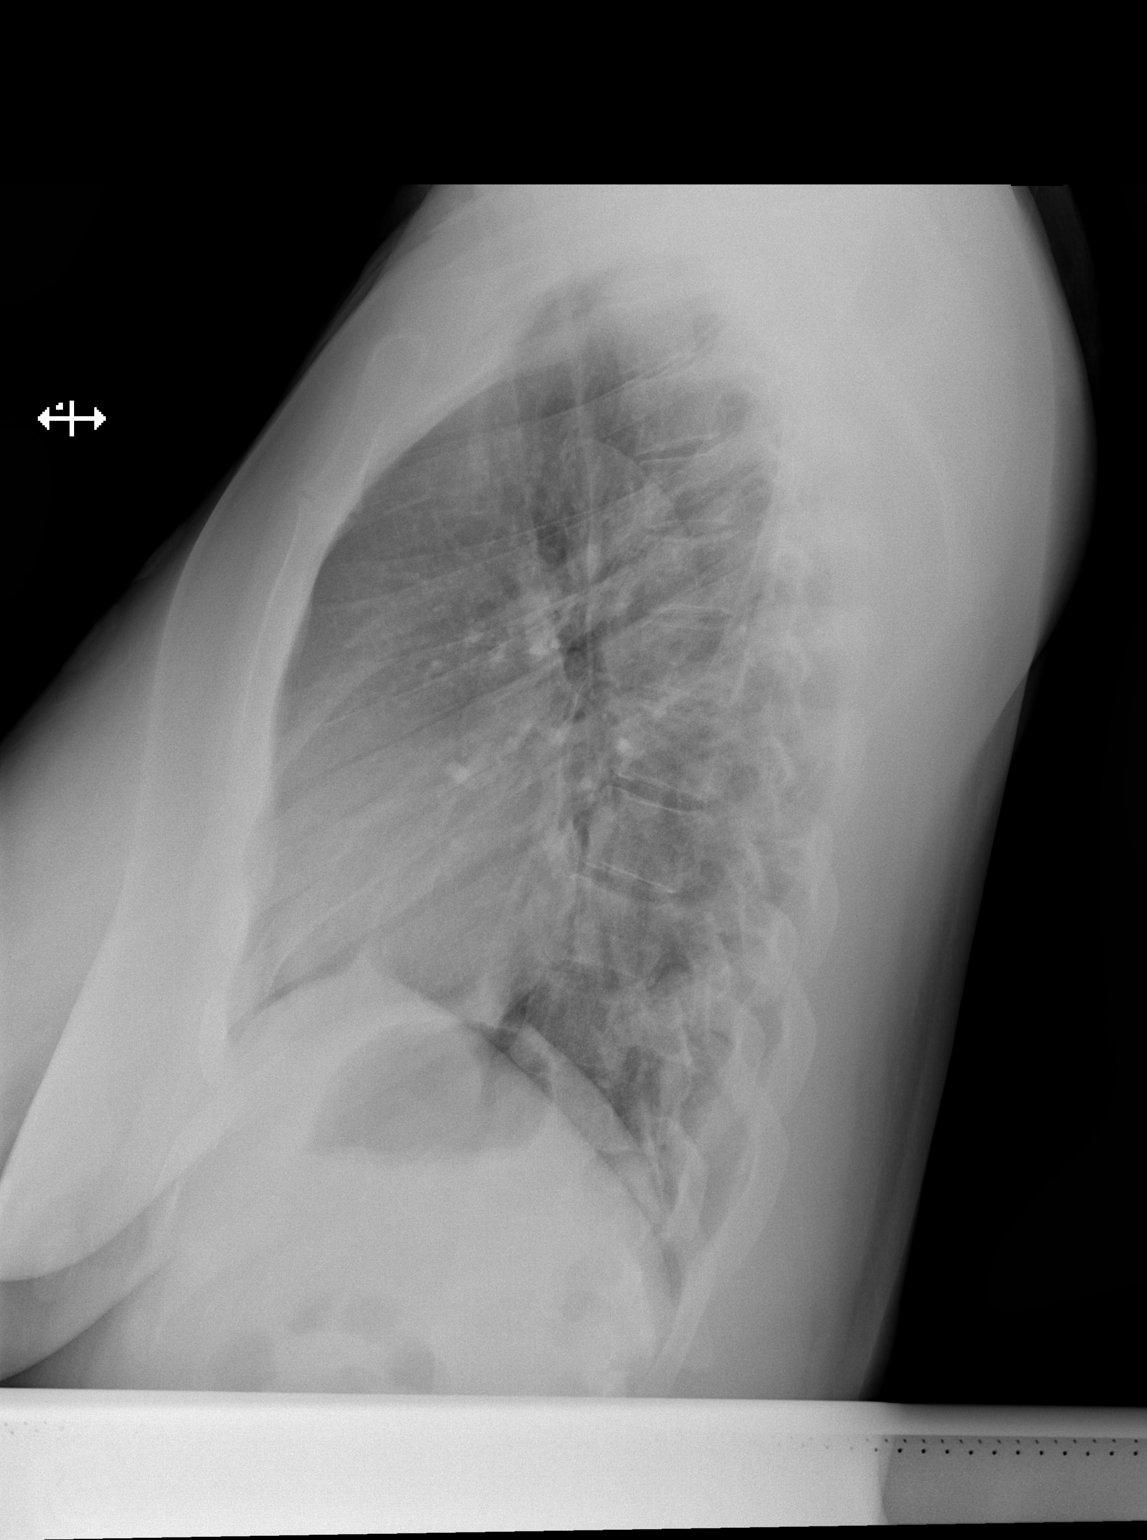

[2 of 2 positions shown; findings below may reference images not displayed]

FINDINGS: The cardiac silhouette is top-normal in size. Normal mediastinal and
hilar contours. Both lungs are clear. No pleural effusion or
pneumothorax. The visualized skeletal structures are unremarkable.
IMPRESSION: No active cardiopulmonary disease.

## 2017-12-06 ENCOUNTER — Encounter: Payer: Self-pay | Admitting: Family Medicine

## 2017-12-06 ENCOUNTER — Other Ambulatory Visit (HOSPITAL_COMMUNITY)
Admission: RE | Admit: 2017-12-06 | Discharge: 2017-12-06 | Disposition: A | Discharge status: Home / Self Care | Payer: Medicaid Other | Source: Ambulatory Visit | Attending: Family Medicine | Admitting: Family Medicine

## 2017-12-06 ENCOUNTER — Ambulatory Visit (INDEPENDENT_AMBULATORY_CARE_PROVIDER_SITE_OTHER): Payer: Medicaid Other | Admitting: Family Medicine

## 2017-12-06 ENCOUNTER — Other Ambulatory Visit: Payer: Self-pay

## 2017-12-06 VITALS — BP 130/90 | Temp 98.6°F | Ht 67.0 in | Wt 183.4 lb

## 2017-12-06 DIAGNOSIS — Z3200 Encounter for pregnancy test, result unknown: Secondary | ICD-10-CM | POA: Diagnosis not present

## 2017-12-06 DIAGNOSIS — N809 Endometriosis, unspecified: Secondary | ICD-10-CM | POA: Diagnosis not present

## 2017-12-06 DIAGNOSIS — N898 Other specified noninflammatory disorders of vagina: Secondary | ICD-10-CM | POA: Diagnosis not present

## 2017-12-06 LAB — POCT WET PREP (WET MOUNT)
CLUE CELLS WET PREP WHIFF POC: NEGATIVE
Trichomonas Wet Prep HPF POC: ABSENT

## 2017-12-06 LAB — POCT URINE PREGNANCY: Preg Test, Ur: NEGATIVE

## 2017-12-06 MED ORDER — NORETHINDRONE-ETH ESTRADIOL 1-5 MG-MCG PO TABS: 1.0000 | ORAL_TABLET | Freq: Every day | ORAL | 11 refills | Status: DC

## 2017-12-06 NOTE — Progress Notes (Signed)
   Subjective:    Amy Snyder - 26 y.o. female MRN [id number]  Date of birth: 1991-11-20  HPI  Amy Snyder is here for changes in vaginal discharge and for concern about possible pregnancy.  Vaginal discharge Patient says she gets bacterial vaginosis frequently and thinks that she may have it now since her vaginal discharge is "runny."  She denies malodorous discharge.  She is also worried about possible STD exposure since she and her boyfriend sometimes forget to use condoms.  However, she is adamant that they are in a monogamous relationship.    Possible pregnancy She is worried about a possible pregnancy since she and her boyfriend only sometimes use condoms.  Her LMP was one to two weeks ago.  Pelvic pain She says that she has had chronic pelvic pain since the birth of her 26 year old.  She also endorses dyspareunia and pain during menstruation.  She is interested in birth control pills for alleviation of this cyclic pain.     Health Maintenance:  Health Maintenance Due  Topic Date Due  . TETANUS/TDAP  10/01/2009    -  reports that she has been smoking cigarettes.  She has a 0.60 pack-year smoking history. she has never used smokeless tobacco. - Review of Systems: Per HPI. - Past Medical History: Patient Active Problem List   Diagnosis Date Noted  . Endometriosis 12/06/2017  . Possible pregnancy 12/06/2017  . Pain in both feet 10/14/2017  . Abnormal Pap smear of cervix 09/26/2016  . Headache 08/27/2016  . Vaginal discharge 06/01/2016  . Essential hypertension, benign 07/03/2014  . BV (bacterial vaginosis) 01/21/2014  . 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 Excessive or frequent menstruation 08/02/2013   - Medications: reviewed and updated   Objective:   Physical Exam BP 130/90   Temp 98.6 F (37 C) (Oral)   Ht 5\' 7"  (1.702 m)   Wt 183 lb 6.4 oz (83.2 kg)   LMP 11/20/2017   BMI 28.72 kg/m  Gen: NAD, alert, cooperative with exam, well-appearing HEENT:  NCAT, clear conjunctiva, supple neck CV: RRR, good S1/S2, no murmur, no edema Resp: CTABL, no wheezes, non-labored Abd: SNTND, BS present, no guarding or organomegaly Skin: no rashes, normal turgor  GU: normal vulva, vagina, and cervix; no lesions or abnormal discharge noted Neuro: no gross deficits.  Psych: good insight, alert and oriented        Assessment & Plan:   Endometriosis Dyspareunia, pain during menstruation as well as history of C-section make this diagnosis likely.  Will prescribe birth control pills to help alleviate patient's endometriosis-related pain.  Vaginal discharge Wet prep and GC/Chlamydia performed today.  Wet prep is negative - changes in discharge likely physiologic.  Patient counseled that we will contact her with results and prescribe medications as necessary.  Possible pregnancy Urine pregnancy performed today at end of visit and was negative.  Advised patient that this means she was not pregnant two weeks ago and cannot catch very early pregnancies.  We will prescribe oral contraceptives today since she would rather not become pregnant at this time and currently only uses condoms occasionally for contraception.  Counseled patient on starting these on the first Sunday after her period.  Strongly encouraged patient to use condoms every time during sex, at least until she has been on oral contraception for a couple weeks.    Maia Breslow, M.D. 12/06/2017, 5:15 PM PGY-1, [facility]

## 2017-12-06 NOTE — Patient Instructions (Addendum)
It was nice meeting you today [name]!  Today, we performed a wet prep and tested for gonorrhea and chlamydia.  We will contact you as soon as these results come back.  We also performed a urine pregnancy test and we will let you know that result as soon as possible as well.  I prescribed an oral birth control for you, which will hopefully help decrease the pain you feel during your period and give you more control over whether you become pregnant.  If you have any questions or concerns, please feel free to call the clinic.   Be well,  Dr. Shan Levans  Endometriosis Endometriosis is a condition in which the tissue that lines the uterus (endometrium) grows outside of its normal location. The tissue may grow in many locations close to the uterus, but it commonly grows on the ovaries, fallopian tubes, vagina, or bowel. When the uterus sheds the endometrium every menstrual cycle, there is bleeding wherever the endometrial tissue is located. This can cause pain because blood is irritating to tissues that are not normally exposed to it. What are the causes? The cause of endometriosis is not known. What increases the risk? You may be more likely to develop endometriosis if you:  Have a family history of endometriosis.  Have never given birth.  Started your period at age 44 or younger.  Have high levels of estrogen in your body.  Were exposed to a certain medicine (diethylstilbestrol) before you were born (in utero).  Had low birth weight.  Were born as a twin, triplet, or other multiple.  Have a BMI of less than 25. BMI is an estimate of body fat and is calculated from height and weight.  What are the signs or symptoms? Often, there are no symptoms of this condition. If you do have symptoms, they may:  Vary depending on where your endometrial tissue is growing.  Occur during your menstrual period (most common) or midcycle.  Come and go, or you may go months with no symptoms at  all.  Stop with menopause.  Symptoms may include:  Pain in the back or abdomen.  Heavier bleeding during periods.  Pain during sex.  Painful bowel movements.  Infertility.  Pelvic pain.  Bleeding more than once a month.  How is this diagnosed? This condition is diagnosed based on your symptoms and a physical exam. You may have tests, such as:  Blood tests and urine tests. These may be done to help rule out other possible causes of your symptoms.  Ultrasound, to look for abnormal tissues.  An X-ray of the lower bowel (barium enema).  An ultrasound that is done through the vagina (transvaginally).  CT scan.  MRI.  Laparoscopy. In this procedure, a lighted, pencil-sized instrument called a laparoscope is inserted into your abdomen through an incision. The laparoscope allows your health care provider to look at the organs inside your body and check for abnormal tissue to confirm the diagnosis. If abnormal tissue is found, your health care provider may remove a small piece of tissue (biopsy) to be examined under a microscope.  How is this treated? Treatment for this condition may include:  Medicines to relieve pain, such as NSAIDs.  Hormone therapy. This involves using artificial (synthetic) hormones to reduce endometrial tissue growth. Your health care provider may recommend using a hormonal form of birth control, or other medicines.  Surgery. This may be done to remove abnormal endometrial tissue. ? In some cases, tissue may be removed using  a laparoscope and a laser (laparoscopic laser treatment). ? In severe cases, surgery may be done to remove the fallopian tubes, uterus, and ovaries (hysterectomy).  Follow these instructions at home:  Take over-the-counter and prescription medicines only as told by your health care provider.  Do not drive or use heavy machinery while taking prescription pain medicine.  Try to avoid activities that cause pain, including sexual  activity.  Keep all follow-up visits as told by your health care provider. This is important. Contact a health care provider if:  You have pain in the area between your hip bones (pelvic area) that occurs: ? Before, during, or after your period. ? In between your period and gets worse during your period. ? During or after sex. ? With bowel movements or urination, especially during your period.  You have problems getting pregnant.  You have a fever. Get help right away if:  You have severe pain that does not get better with medicine.  You have severe nausea and vomiting, or you cannot eat without vomiting.  You have pain that affects only the lower, right side of your abdomen.  You have abdominal pain that gets worse.  You have abdominal swelling.  You have blood in your stool. This information is not intended to replace advice given to you by your health care provider. Make sure you discuss any questions you have with your health care provider. Document Released: 12/03/2000 Document Revised: 09/10/2016 Document Reviewed: 05/08/2016 Elsevier Interactive Patient Education  [employer].

## 2017-12-06 NOTE — Assessment & Plan Note (Addendum)
Urine pregnancy performed today at end of visit and was negative.  Advised patient that this means she was not pregnant two weeks ago and cannot catch very early pregnancies.  We will prescribe oral contraceptives today since she would rather not become pregnant at this time and currently only uses condoms occasionally for contraception.  Counseled patient on starting these on the first Sunday after her period.  Strongly encouraged patient to use condoms every time during sex, at least until she has been on oral contraception for a couple weeks.

## 2017-12-06 NOTE — Assessment & Plan Note (Addendum)
Wet prep and GC/Chlamydia performed today.  Wet prep is negative - changes in discharge likely physiologic.  Patient counseled that we will contact her with results and prescribe medications as necessary.

## 2017-12-06 NOTE — Assessment & Plan Note (Addendum)
Dyspareunia, pain during menstruation as well as history of C-section make this diagnosis likely.  Will prescribe birth control pills to help alleviate patient's endometriosis-related pain.

## 2017-12-07 LAB — CERVICOVAGINAL ANCILLARY ONLY
CHLAMYDIA, DNA PROBE: NEGATIVE
NEISSERIA GONORRHEA: NEGATIVE

## 2017-12-21 ENCOUNTER — Telehealth: Payer: Self-pay | Admitting: Family Medicine

## 2017-12-21 NOTE — Telephone Encounter (Signed)
Will route to Dr. Shan Levans who saw patient at this visit. Jazmin Hartsell,CMA

## 2017-12-21 NOTE — Telephone Encounter (Signed)
Pt called concerning her results from her last appointment. Please contact pt

## 2017-12-21 NOTE — Telephone Encounter (Signed)
Informed patient of negative results from her appt on 12/18 but she states that she is still having this "rubbery, mucus like" discharge.  Would like to know what she can do.  Zeva Leber,CMA

## 2017-12-22 NOTE — Telephone Encounter (Signed)
Please let Amy Snyder know that her symptoms may be due to the normal variability of vaginal discharge during the menstrual cycle, but if she is still concerned, we will be happy to test her again if she would like to make an appointment.

## 2017-12-23 NOTE — Telephone Encounter (Signed)
Pt informed and stated her period is due to come in about a week or so and she decided to wait till after it is gone to see if the discharge is still present and if so will then make an appointment. Katharina Caper, Kahli Fitzgerald D, [place]

## 2018-01-06 ENCOUNTER — Ambulatory Visit: Payer: Medicaid Other | Admitting: Family Medicine

## 2018-01-06 ENCOUNTER — Ambulatory Visit: Payer: Medicaid Other | Admitting: Internal Medicine

## 2018-01-10 ENCOUNTER — Encounter: Payer: Self-pay | Admitting: Family Medicine

## 2018-01-10 ENCOUNTER — Other Ambulatory Visit: Payer: Self-pay

## 2018-01-10 ENCOUNTER — Ambulatory Visit: Payer: Medicaid Other | Admitting: Family Medicine

## 2018-01-10 ENCOUNTER — Other Ambulatory Visit (HOSPITAL_COMMUNITY)
Admission: RE | Admit: 2018-01-10 | Discharge: 2018-01-10 | Disposition: A | Discharge status: Home / Self Care | Payer: Medicaid Other | Source: Ambulatory Visit | Attending: Family Medicine | Admitting: Family Medicine

## 2018-01-10 VITALS — BP 132/82 | HR 69 | Temp 97.8°F | Wt 181.0 lb

## 2018-01-10 DIAGNOSIS — N898 Other specified noninflammatory disorders of vagina: Secondary | ICD-10-CM | POA: Insufficient documentation

## 2018-01-10 LAB — POCT WET PREP (WET MOUNT)
CLUE CELLS WET PREP WHIFF POC: NEGATIVE
TRICHOMONAS WET PREP HPF POC: ABSENT

## 2018-01-10 NOTE — Progress Notes (Signed)
    Subjective:  Amy Snyder is a 27 y.o. female who presents to the [facility] today with a chief complaint of vaginal discharge.   HPI:  Here with heavy vaginal discharge for the past 2 weeks. Similar to BV she has had in the past with an odor.  No itching. No bleeding. No urinary symptoms. Is sexually active with 1 partner, monogamous.   ROS: Per HPI  Objective:  Physical Exam: BP 132/82   Pulse 69   Temp 97.8 F (36.6 C) (Oral)   Wt 181 lb (82.1 kg)   LMP 01/02/2018   SpO2 99%   BMI 28.35 kg/m   Gen: NAD, resting comfortably Pelvic exam: normal external genitalia, vulva, vagina, cervix, uterus and adnexa. Skin: warm, dry Neuro: grossly normal, moves all extremities Psych: Normal affect and thought content  Results for orders placed or performed in visit on 01/10/18 (from the past 72 hour(s))  POCT Wet Prep [facility] [place])     Status: None   Collection Time: 01/10/18  2:32 PM  Result Value Ref Range   Source Wet Prep POC VAG    WBC, Wet Prep HPF POC 1-5    Bacteria Wet Prep HPF POC Few Few   Clue Cells Wet Prep HPF POC None None   Clue Cells Wet Prep Whiff POC Negative Whiff    Yeast Wet Prep HPF POC None    Trichomonas Wet Prep HPF POC Absent Absent     Assessment/Plan:  Vaginal discharge Wet prep negative today and patient aware of results but GC/CT collected. Likely physiologic discharge given normal exam today.   Bufford Lope, DO PGY-2, [facility] 01/10/2018 2:27 PM

## 2018-01-10 NOTE — Patient Instructions (Signed)
We will call you with the results of the wet prep.   The STD testing will take a couple of days. We will call you if there are any concerns.

## 2018-01-10 NOTE — Assessment & Plan Note (Signed)
Wet prep negative today and patient aware of results but GC/CT collected. Likely physiologic discharge given normal exam today.

## 2018-01-11 LAB — CERVICOVAGINAL ANCILLARY ONLY
Chlamydia: NEGATIVE
Neisseria Gonorrhea: NEGATIVE

## 2018-01-16 ENCOUNTER — Telehealth: Payer: Self-pay | Admitting: Family Medicine

## 2018-01-16 NOTE — Telephone Encounter (Signed)
Would like results from last week.  Please call her back

## 2018-01-16 NOTE — Telephone Encounter (Signed)
Pt contacted with no answer, LVM to call back for results. All of her results are negative.

## 2018-01-31 ENCOUNTER — Other Ambulatory Visit (HOSPITAL_COMMUNITY)
Admission: RE | Admit: 2018-01-31 | Discharge: 2018-01-31 | Disposition: A | Discharge status: Home / Self Care | Payer: Medicaid Other | Source: Ambulatory Visit | Attending: Obstetrics | Admitting: Obstetrics

## 2018-01-31 ENCOUNTER — Ambulatory Visit: Payer: Medicaid Other | Admitting: Obstetrics

## 2018-01-31 ENCOUNTER — Encounter: Payer: Self-pay | Admitting: Obstetrics

## 2018-01-31 VITALS — Wt 179.8 lb

## 2018-01-31 DIAGNOSIS — Z3009 Encounter for other general counseling and advice on contraception: Secondary | ICD-10-CM

## 2018-01-31 DIAGNOSIS — B9689 Other specified bacterial agents as the cause of diseases classified elsewhere: Secondary | ICD-10-CM | POA: Insufficient documentation

## 2018-01-31 DIAGNOSIS — N898 Other specified noninflammatory disorders of vagina: Secondary | ICD-10-CM

## 2018-01-31 DIAGNOSIS — Z01419 Encounter for gynecological examination (general) (routine) without abnormal findings: Secondary | ICD-10-CM

## 2018-01-31 DIAGNOSIS — N760 Acute vaginitis: Secondary | ICD-10-CM | POA: Insufficient documentation

## 2018-01-31 DIAGNOSIS — Z0000 Encounter for general adult medical examination without abnormal findings: Secondary | ICD-10-CM

## 2018-01-31 NOTE — Progress Notes (Signed)
Subjective:        Amy Snyder is a 27 y.o. female here for a routine exam.  Current complaints: None.    Personal health questionnaire:  Is patient [name], have a family history of breast and/or ovarian cancer: no Is there a family history of uterine cancer diagnosed at age < 59, gastrointestinal cancer, urinary tract cancer, family member who is a Field seismologist syndrome-associated carrier: no Is the patient overweight and hypertensive, family history of diabetes, personal history of gestational diabetes, preeclampsia or PCOS: no Is patient over 51, have PCOS,  family history of premature CHD under age 68, diabetes, smoke, have hypertension or peripheral artery disease:  no At any time, has a partner hit, kicked or otherwise hurt or frightened you?: no Over the past 2 weeks, have you felt down, depressed or hopeless?: no Over the past 2 weeks, have you felt little interest or pleasure in doing things?:no   Gynecologic History Patient's last menstrual period was 01/25/2018. Contraception: condoms Last Pap: 2017. Results were: normal Last mammogram: n/a. Results were: n/a  Obstetric History OB History  Gravida Para Term Preterm AB Living  1 1   1   1   SAB TAB Ectopic Multiple Live Births          1    # Outcome Date GA Lbr Len/2nd Weight Sex Delivery Anes PTL Lv  1 Preterm 10/21/09 [redacted]w[redacted]d  2 lb 4 oz (1.021 kg) M CS-Unspec EPI  LIV     Birth Comments: [facility]      Past Medical History:  Diagnosis Date  . BV (bacterial vaginosis)   . Hypertension     Past Surgical History:  Procedure Laterality Date  . CESAREAN SECTION  Oct 21, 2009     Current Outpatient Medications:  .  carvedilol (COREG) 3.125 MG tablet, Take 1 tablet (3.125 mg total) by mouth daily., Disp: 90 tablet, Rfl: 0 .  hydrochlorothiazide (HYDRODIURIL) 25 MG tablet, TAKE ONE TABLET BY MOUTH DAILY, Disp: 60 tablet, Rfl: 3 .  ibuprofen (ADVIL,MOTRIN) 600 MG tablet, Take 1 tablet (600 mg total) by mouth every  6 (six) hours as needed for mild pain, moderate pain or cramping., Disp: 30 tablet, Rfl: 0 .  norethindrone-ethinyl estradiol (FEMHRT 1/5) 1-5 MG-MCG TABS tablet, Take 1 tablet by mouth daily., Disp: 28 tablet, Rfl: 11 .  acetaminophen (TYLENOL) 500 MG tablet, Take 1,000 mg by mouth every 6 (six) hours as needed for mild pain., Disp: , Rfl:  Allergies  Allergen Reactions  . Flagyl [Metronidazole] Itching, Swelling and Rash    Social History   Tobacco Use  . Smoking status: Current Some Day Smoker    Packs/day: 0.20    Years: 3.00    Pack years: 0.60    Types: Cigarettes  . Smokeless tobacco: Never Used  . Tobacco comment: smokes socially  Substance Use Topics  . Alcohol use: No    Alcohol/week: 0.0 oz    Comment: socially    Family History  Problem Relation Age of Onset  . Hypertension Mother   . Diabetes Unknown       Review of Systems  Constitutional: negative for fatigue and weight loss Respiratory: negative for cough and wheezing Cardiovascular: negative for chest pain, fatigue and palpitations Gastrointestinal: negative for abdominal pain and change in bowel habits Musculoskeletal:negative for myalgias Neurological: negative for gait problems and tremors Behavioral/Psych: negative for abusive relationship, depression Endocrine: negative for temperature intolerance    Genitourinary:negative for abnormal menstrual periods, genital  lesions, hot flashes, sexual problems and vaginal discharge Integument/breast: negative for breast lump, breast tenderness, nipple discharge and skin lesion(s)    Objective:       Wt 179 lb 12.8 oz (81.6 kg)   LMP 01/25/2018   BMI 28.16 kg/m  General:   alert  Skin:   no rash or abnormalities  Lungs:   clear to auscultation bilaterally  Heart:   regular rate and rhythm, S1, S2 normal, no murmur, click, rub or gallop  Breasts:   normal without suspicious masses, skin or nipple changes or axillary nodes  Abdomen:  normal findings: no  organomegaly, soft, non-tender and no hernia  Pelvis:  External genitalia: normal general appearance Urinary system: urethral meatus normal and bladder without fullness, nontender Vaginal: normal without tenderness, induration or masses Cervix: normal appearance Adnexa: normal bimanual exam Uterus: anteverted and non-tender, normal size   Lab Review Urine pregnancy test Labs reviewed yes Radiologic studies reviewed no  50% of 20 min visit spent on counseling and coordination of care.   Assessment:     1. Encounter for gynecological examination with Papanicolaou smear of cervix Rx: - Cytology - PAP  2. Vaginal discharge Rx: - Cervicovaginal ancillary only  3. Encounter for other general counseling and advice on contraception - wants to continue with condoms only   Plan:    Education reviewed: calcium supplements, depression evaluation, low fat, low cholesterol diet, safe sex/STD prevention, self breast exams, smoking cessation and weight bearing exercise. Contraception: condoms. Follow up in: 1 year.   No orders of the defined types were placed in this encounter.  No orders of the defined types were placed in this encounter.   Shelly Bombard MD

## 2018-02-01 LAB — CERVICOVAGINAL ANCILLARY ONLY
Bacterial vaginitis: POSITIVE — AB
CHLAMYDIA, DNA PROBE: NEGATIVE
Candida vaginitis: NEGATIVE
NEISSERIA GONORRHEA: NEGATIVE
Trichomonas: NEGATIVE

## 2018-02-02 ENCOUNTER — Other Ambulatory Visit: Payer: Self-pay | Admitting: Obstetrics

## 2018-02-02 DIAGNOSIS — N760 Acute vaginitis: Principal | ICD-10-CM

## 2018-02-02 DIAGNOSIS — B9689 Other specified bacterial agents as the cause of diseases classified elsewhere: Secondary | ICD-10-CM

## 2018-02-02 LAB — CYTOLOGY - PAP: DIAGNOSIS: NEGATIVE

## 2018-02-02 MED ORDER — CLINDAMYCIN HCL 300 MG PO CAPS: 300.0000 mg | ORAL_CAPSULE | Freq: Three times a day (TID) | ORAL | 2 refills | Status: DC

## 2018-03-23 ENCOUNTER — Other Ambulatory Visit (HOSPITAL_COMMUNITY)
Admission: RE | Admit: 2018-03-23 | Discharge: 2018-03-23 | Disposition: A | Discharge status: Home / Self Care | Payer: Medicaid Other | Source: Ambulatory Visit | Attending: Obstetrics | Admitting: Obstetrics

## 2018-03-23 ENCOUNTER — Encounter: Payer: Self-pay | Admitting: Obstetrics

## 2018-03-23 ENCOUNTER — Ambulatory Visit (INDEPENDENT_AMBULATORY_CARE_PROVIDER_SITE_OTHER): Payer: Medicaid Other | Admitting: Obstetrics

## 2018-03-23 VITALS — BP 140/92 | HR 61 | Wt 172.2 lb

## 2018-03-23 DIAGNOSIS — N760 Acute vaginitis: Secondary | ICD-10-CM | POA: Diagnosis not present

## 2018-03-23 DIAGNOSIS — Z3202 Encounter for pregnancy test, result negative: Secondary | ICD-10-CM

## 2018-03-23 DIAGNOSIS — N898 Other specified noninflammatory disorders of vagina: Secondary | ICD-10-CM | POA: Diagnosis not present

## 2018-03-23 DIAGNOSIS — B9689 Other specified bacterial agents as the cause of diseases classified elsewhere: Secondary | ICD-10-CM

## 2018-03-23 LAB — POCT URINE PREGNANCY: Preg Test, Ur: NEGATIVE

## 2018-03-23 MED ORDER — FLUCONAZOLE 150 MG PO TABS: 150.0000 mg | ORAL_TABLET | Freq: Once | ORAL | 2 refills | Status: AC

## 2018-03-23 MED ORDER — CLINDAMYCIN HCL 300 MG PO CAPS: 300.0000 mg | ORAL_CAPSULE | Freq: Three times a day (TID) | ORAL | 0 refills | Status: DC

## 2018-03-23 NOTE — Progress Notes (Signed)
[name], female   DOB: April 18, 1991, 27 y.o.   MRN: [id number]  Chief Complaint  Patient presents with  . Vaginal Discharge    HPI Amy Snyder is a 27 y.o. female.  Vaginal discharge for past 2 weeks.  Denies odor or irritation. HPI  Past Medical History:  Diagnosis Date  . BV (bacterial vaginosis)   . Hypertension     Past Surgical History:  Procedure Laterality Date  . CESAREAN SECTION  Oct 21, 2009    Family History  Problem Relation Age of Onset  . Hypertension Mother   . Diabetes Unknown     Social History Social History   Tobacco Use  . Smoking status: Current Some Day Smoker    Packs/day: 0.20    Years: 3.00    Pack years: 0.60    Types: Cigarettes  . Smokeless tobacco: Never Used  . Tobacco comment: smokes socially  Substance Use Topics  . Alcohol use: No    Alcohol/week: 0.0 oz    Comment: socially  . Drug use: No    Allergies  Allergen Reactions  . Flagyl [Metronidazole] Itching, Swelling and Rash    Current Outpatient Medications  Medication Sig Dispense Refill  . carvedilol (COREG) 3.125 MG tablet Take 1 tablet (3.125 mg total) by mouth daily. 90 tablet 0  . hydrochlorothiazide (HYDRODIURIL) 25 MG tablet TAKE ONE TABLET BY MOUTH DAILY 60 tablet 3  . acetaminophen (TYLENOL) 500 MG tablet Take 1,000 mg by mouth every 6 (six) hours as needed for mild pain.    . clindamycin (CLEOCIN) 300 MG capsule Take 1 capsule (300 mg total) by mouth 3 (three) times daily. 21 capsule 0  . fluconazole (DIFLUCAN) 150 MG tablet Take 1 tablet (150 mg total) by mouth once for 1 dose. 1 tablet 2  . ibuprofen (ADVIL,MOTRIN) 600 MG tablet Take 1 tablet (600 mg total) by mouth every 6 (six) hours as needed for mild pain, moderate pain or cramping. (Patient not taking: Reported on 03/23/2018) 30 tablet 0  . norethindrone-ethinyl estradiol (FEMHRT 1/5) 1-5 MG-MCG TABS tablet Take 1 tablet by mouth daily. (Patient not taking: Reported on 03/23/2018) 28 tablet 11    No current facility-administered medications for this visit.     Review of Systems Review of Systems Constitutional: negative for fatigue and weight loss Respiratory: negative for cough and wheezing Cardiovascular: negative for chest pain, fatigue and palpitations Gastrointestinal: negative for abdominal pain and change in bowel habits Genitourinary:POSITIVE FOR VAGINAL DISCHARGE Integument/breast: negative for nipple discharge Musculoskeletal:negative for myalgias Neurological: negative for gait problems and tremors Behavioral/Psych: negative for abusive relationship, depression Endocrine: negative for temperature intolerance      Blood pressure (!) 140/92, pulse 61, weight 172 lb 3.2 oz (78.1 kg).  Physical Exam Physical Exam Pelvis:  External genitalia: normal general appearance Urinary system: urethral meatus normal and bladder without fullness, nontender Vaginal: normal without tenderness, induration or masses Cervix: normal appearance Adnexa: normal bimanual exam Uterus: anteverted and non-tender, normal size    50% of 15 min visit spent on counseling and coordination of care.   Data Reviewed Wet Prep Cultures  Assessment     1. Vaginal discharge Rx: - Cervicovaginal ancillary only - fluconazole (DIFLUCAN) 150 MG tablet; Take 1 tablet (150 mg total) by mouth once for 1 dose.  Dispense: 1 tablet; Refill: 2  2. BV (bacterial vaginosis) Rx: - clindamycin (CLEOCIN) 300 MG capsule; Take 1 capsule (300 mg total) by mouth 3 (three) times daily.  Dispense:  21 capsule; Refill: 0  3. Pregnancy examination or test, negative result Rx: - POCT urine pregnancy    Plan    Orders Placed This Encounter  Procedures  . POCT urine pregnancy   Meds ordered this encounter  Medications  . clindamycin (CLEOCIN) 300 MG capsule    Sig: Take 1 capsule (300 mg total) by mouth 3 (three) times daily.    Dispense:  21 capsule    Refill:  0  . fluconazole (DIFLUCAN) 150 MG  tablet    Sig: Take 1 tablet (150 mg total) by mouth once for 1 dose.    Dispense:  1 tablet    Refill:  2    Shelly Bombard MD 03-23-2018

## 2018-03-23 NOTE — Progress Notes (Signed)
C/o of white mucus discharge x 2 months; wants STD Screening. Denies odor, pain, NV. She also had unprotected sex last Tuesday.  UPT today is Negative.

## 2018-03-27 LAB — CERVICOVAGINAL ANCILLARY ONLY
BACTERIAL VAGINITIS: POSITIVE — AB
CANDIDA VAGINITIS: NEGATIVE
CHLAMYDIA, DNA PROBE: NEGATIVE
NEISSERIA GONORRHEA: NEGATIVE
Trichomonas: NEGATIVE

## 2018-03-28 ENCOUNTER — Other Ambulatory Visit: Payer: Self-pay | Admitting: Obstetrics

## 2018-04-25 ENCOUNTER — Telehealth: Payer: Self-pay

## 2018-04-25 NOTE — Telephone Encounter (Signed)
Returned call, no answer, left vm 

## 2018-05-02 ENCOUNTER — Telehealth: Payer: Self-pay

## 2018-05-02 NOTE — Telephone Encounter (Signed)
Pt states that the clindamycin is not working to clear her BV. She would like to know if a different rx can be sent to the pharmacy for this. Pt is allergic to flagyl.

## 2018-05-03 NOTE — Telephone Encounter (Signed)
Xylafem or Boric Acid would be next treatment.

## 2018-05-03 NOTE — Telephone Encounter (Signed)
Pt would like one of these sent to the pharmacy. I did inform her that this may not be covered by medicaid. Pt states that she would see how much it cost if its not covered.

## 2018-05-03 NOTE — Telephone Encounter (Signed)
Left message on vm for pt to return call to office 

## 2018-05-04 ENCOUNTER — Other Ambulatory Visit: Payer: Self-pay | Admitting: Obstetrics

## 2018-05-04 DIAGNOSIS — B9689 Other specified bacterial agents as the cause of diseases classified elsewhere: Secondary | ICD-10-CM

## 2018-05-04 DIAGNOSIS — N760 Acute vaginitis: Principal | ICD-10-CM

## 2018-05-04 MED ORDER — HYLAFEM VA SUPP: 1.0000 | Freq: Every day | VAGINAL | 5 refills | Status: DC

## 2018-05-04 NOTE — Telephone Encounter (Signed)
Hylafem Rx for BV.

## 2018-05-30 NOTE — Progress Notes (Signed)
   Subjective:   [name]    DOB: 23-Feb-1991, 27 y.o. female   MRN: [id number]  Amy Snyder is a 27 y.o. female with a history of HTN, endometriosis here for   STD testing - noted to have started homeopathic vaginal suppository 5/16 - birth control: none, doesn't want. Sometimes uses condoms but not every time. - UTD on pap, normal results 01/2018 - gets BV frequently. Allergic to flagyl. Last BV in 03/2018. Will get mucus vaginal discharge, sometimes will have abnormal smell. States current symptoms are similar to previous bouts of BV. - sexually active with 1 female partner, no new partners. - no dysuria, no urinary frequency. - Just started period during appointment.  Review of Systems:  Per HPI.  Joshua, medications and smoking status reviewed.  Objective:   BP 125/80   Pulse 75   Temp 99 F (37.2 C)   Wt 178 lb 9.6 oz (81 kg)   SpO2 98%   BMI 27.97 kg/m  Vitals and nursing note reviewed.  General: well nourished, well developed, in no acute distress with non-toxic appearance HEENT: normocephalic, atraumatic, moist mucous membranes CV: regular rate and rhythm without murmurs, rubs, or gallops, no lower extremity edema Lungs: clear to auscultation bilaterally with normal work of breathing Abdomen: soft, non-tender, non-distended, no masses or organomegaly palpable GYN:  External genitalia within normal limits.  Vaginal mucosa pink, moist, normal rugae.  Nonfriable cervix without lesions, no discharge noted on speculum exam. Vaginal bleeding noted. Bimanual exam revealed normal, nongravid uterus.  No cervical motion tenderness. No adnexal masses bilaterally.   Skin: warm, dry, no rashes or lesions Extremities: warm and well perfused, normal tone MSK: ROM grossly intact, strength intact, gait normal Neuro: Alert and oriented, speech normal  Assessment & Plan:   Vaginal discharge Patient notes symptoms similar to previous episode of BV. Wants checked for  other STDs as well. Sexually active with no consistent for of contraception. Discussed safe sex practices and recommended contraception as patient is not wanting to be pregnant currently, however she declined.  Will obtain RPR, HIV, GC, chlamydia. Wet prep indicative of BV. Given hives and itching to last dose of oral metronidazole and doesn't feel clindamycin worked well previously, will provide one dose of MetroGel with strict return precautions.  Orders Placed This Encounter  Procedures  . HIV antibody (with reflex)  . RPR  . POCT Wet Prep [facility])   Meds ordered this encounter  Medications  . metroNIDAZOLE 1.3 % GEL    Sig: Place 1 Applicatorful vaginally once for 1 dose.    Dispense:  1 Tube    Refill:  0    Rory Percy, DO PGY-1, [facility] 05/31/2018 4:02 PM

## 2018-05-31 ENCOUNTER — Ambulatory Visit (INDEPENDENT_AMBULATORY_CARE_PROVIDER_SITE_OTHER): Payer: Medicaid Other | Admitting: Family Medicine

## 2018-05-31 ENCOUNTER — Other Ambulatory Visit (HOSPITAL_COMMUNITY)
Admission: RE | Admit: 2018-05-31 | Discharge: 2018-05-31 | Disposition: A | Discharge status: Home / Self Care | Payer: Medicaid Other | Source: Ambulatory Visit | Attending: Family Medicine | Admitting: Family Medicine

## 2018-05-31 ENCOUNTER — Encounter: Payer: Self-pay | Admitting: Family Medicine

## 2018-05-31 VITALS — BP 125/80 | HR 75 | Temp 99.0°F | Wt 178.6 lb

## 2018-05-31 DIAGNOSIS — N898 Other specified noninflammatory disorders of vagina: Secondary | ICD-10-CM | POA: Diagnosis present

## 2018-05-31 LAB — POCT WET PREP (WET MOUNT)
Clue Cells Wet Prep Whiff POC: NEGATIVE
TRICHOMONAS WET PREP HPF POC: ABSENT

## 2018-05-31 MED ORDER — METRONIDAZOLE 1.3 % VA GEL: 1.0000 | Freq: Once | VAGINAL | 0 refills | Status: AC

## 2018-05-31 NOTE — Assessment & Plan Note (Addendum)
Patient notes symptoms similar to previous episode of BV. Wants checked for other STDs as well. Sexually active with no consistent for of contraception. Discussed safe sex practices and recommended contraception as patient is not wanting to be pregnant currently, however she declined.  Will obtain RPR, HIV, GC, chlamydia. Wet prep indicative of BV. Given hives and itching to last dose of oral metronidazole and doesn't feel clindamycin worked well previously, will provide one dose of MetroGel with strict return precautions.

## 2018-05-31 NOTE — Patient Instructions (Signed)
It was great to see you!  Our plans for today, - We are checking a wet prep and labs for yeast, BV, STDs. We will call you with results and treat as indicated. - Keep in mind the only way to prevent STDs other than not having sex is to wear a condom every time you have sex. - Let us know if you change your mind and decide to use birth control.  Take care and seek immediate care sooner if you develop any concerns.   Dr. [facility]

## 2018-06-01 ENCOUNTER — Telehealth: Payer: Self-pay

## 2018-06-01 ENCOUNTER — Other Ambulatory Visit: Payer: Self-pay | Admitting: Family Medicine

## 2018-06-01 LAB — RPR: RPR Ser Ql: NONREACTIVE

## 2018-06-01 LAB — HIV ANTIBODY (ROUTINE TESTING W REFLEX): HIV Screen 4th Generation wRfx: NONREACTIVE

## 2018-06-01 MED ORDER — METRONIDAZOLE 0.75 % VA GEL: 1.0000 | Freq: Two times a day (BID) | VAGINAL | 0 refills | Status: AC

## 2018-06-01 NOTE — Telephone Encounter (Signed)
0.75% Metrogel prescribed.

## 2018-06-01 NOTE — Telephone Encounter (Signed)
Received fax from [place] requesting prior authorization of Nuvessa gel 1.3%. Spoke with pharmacist as Metronidazole gel 1.3% was prescribed. Per pharmacist the 1.3% strength is only available as brand name Nuvessa gel. This requires a PA. Metronidazole gel is available a 0.75% gel.  Please send Metronidazole gel 0.75% or if would like to attempt PA on Nuvessa 1.3 % gel, the form is in your box for completion along with Medicaid formulary.  Danley Danker, RN [facility] RN)

## 2018-06-02 LAB — CERVICOVAGINAL ANCILLARY ONLY
Bacterial vaginitis: POSITIVE — AB
Chlamydia: NEGATIVE
NEISSERIA GONORRHEA: NEGATIVE
Trichomonas: NEGATIVE

## 2018-07-08 IMAGING — CR DG CHEST 2V
2 series · 2 of 2 positions shown · non-contrast
Comparison: 03/26/2016

CLINICAL DATA: Midsternal chest pain began this morning.

EXAM:
CHEST  2 VIEW

[chest pa]
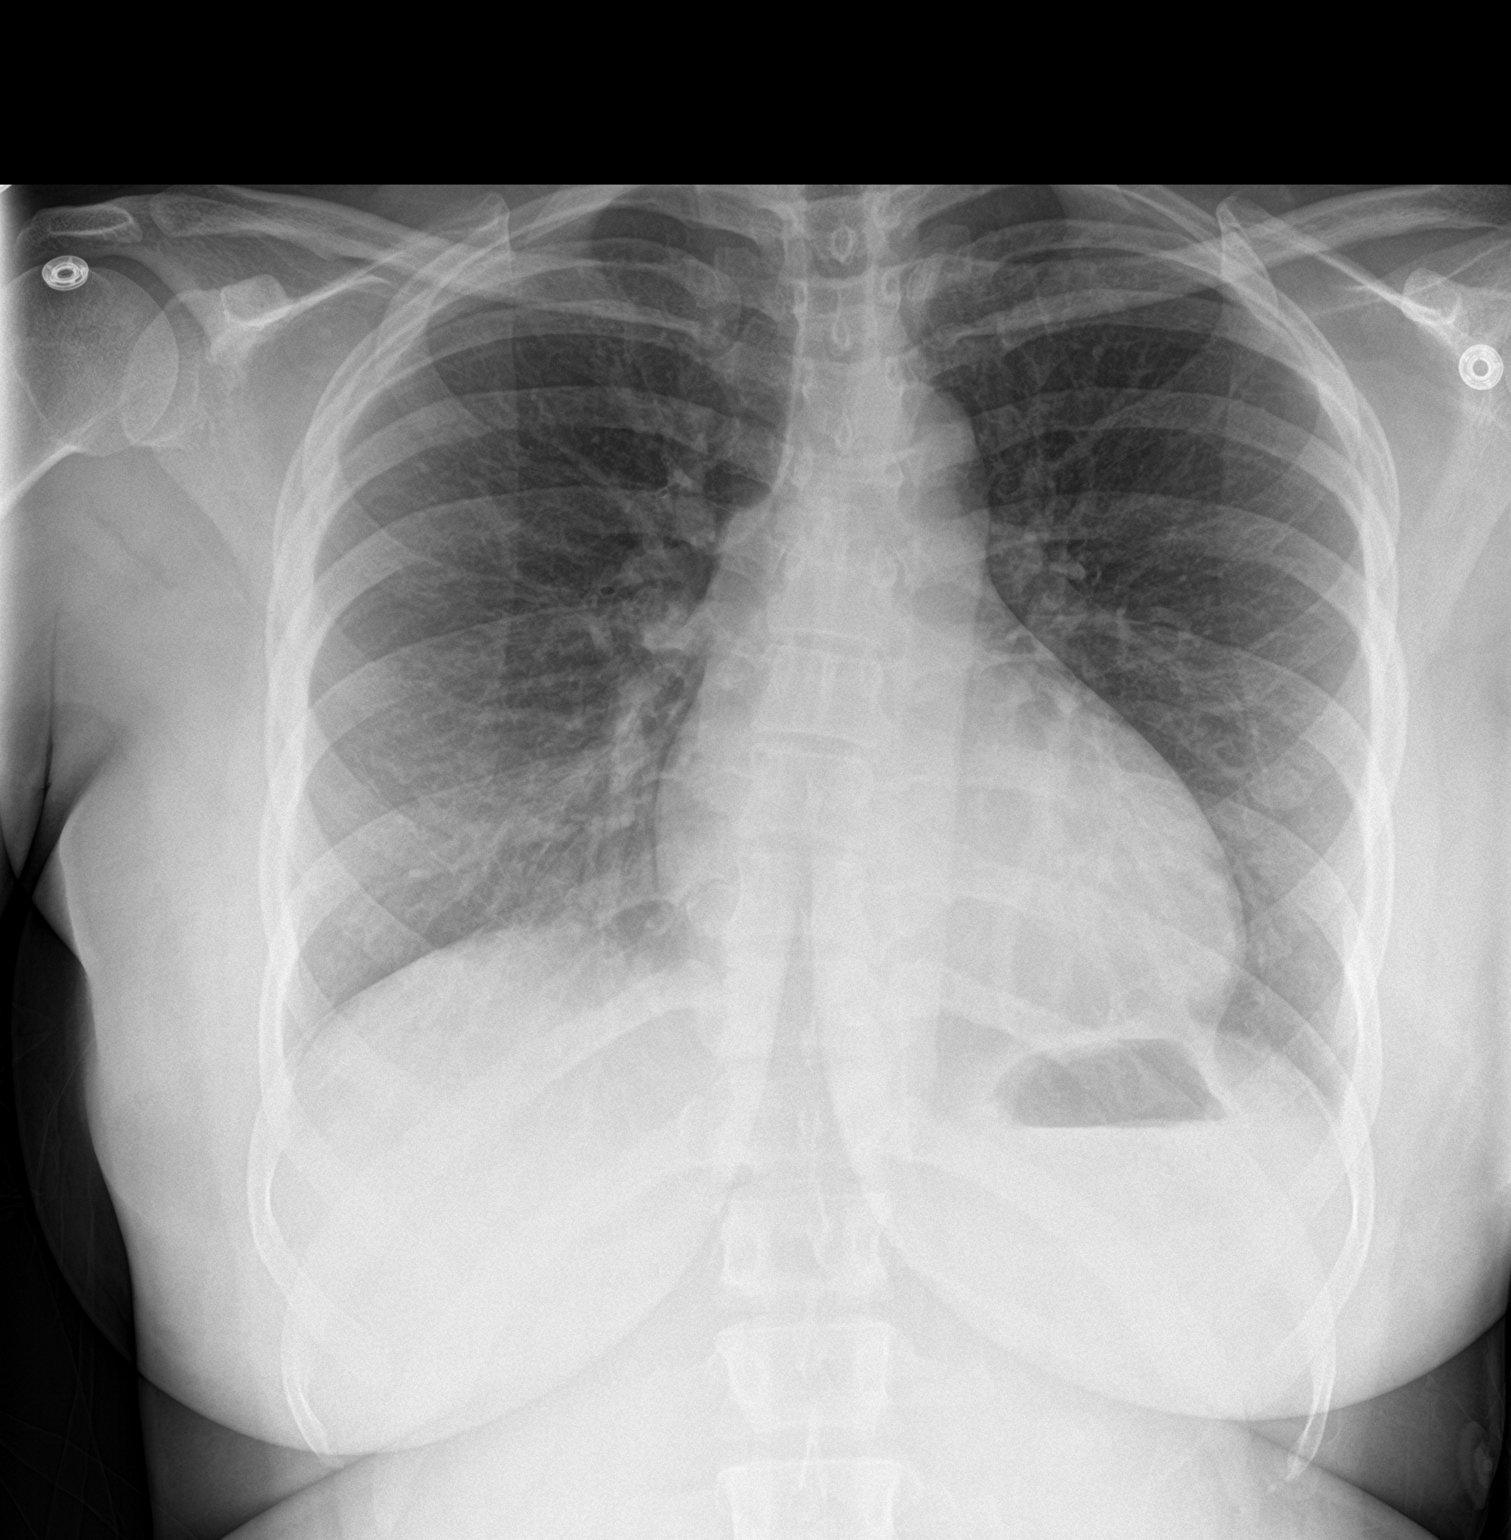

[chest lat]
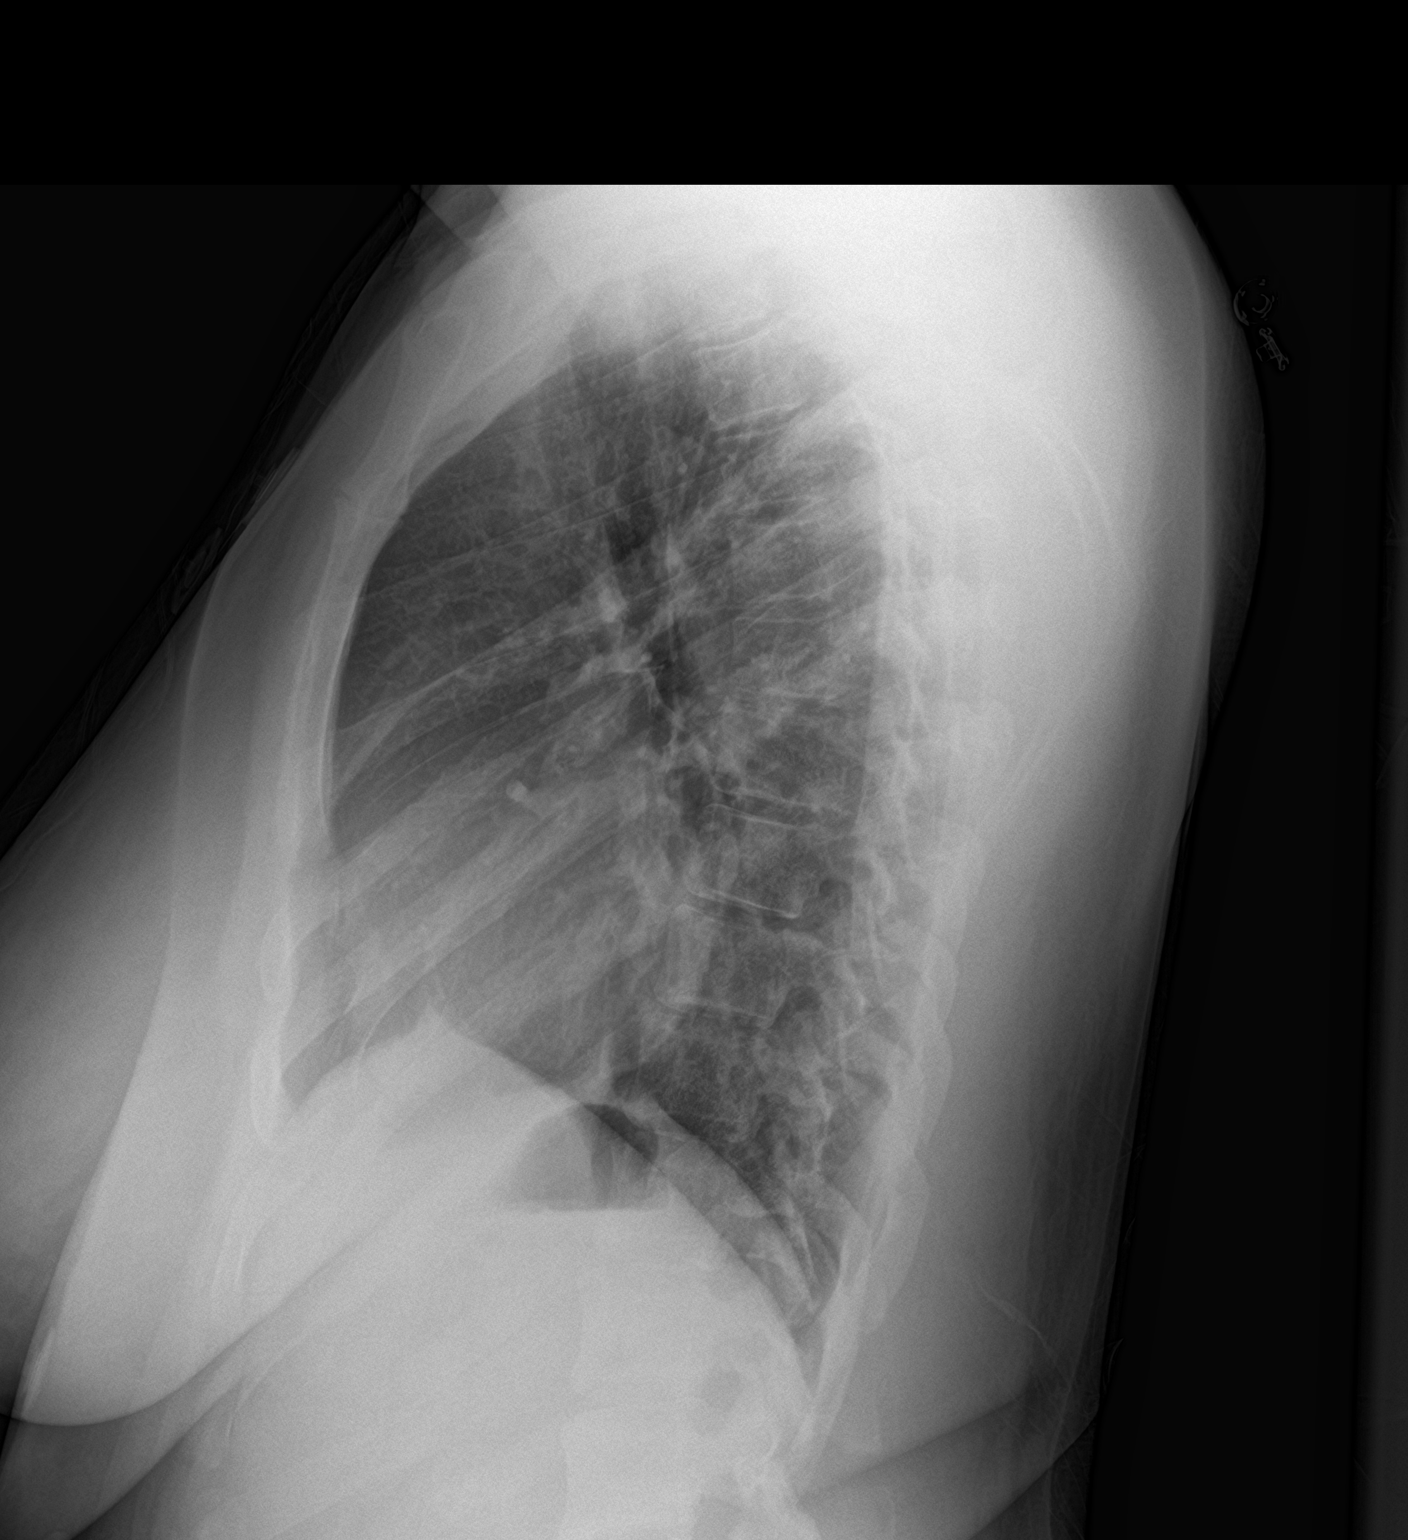

[2 of 2 positions shown; findings below may reference images not displayed]

FINDINGS: The heart size and mediastinal contours are within normal limits.
Both lungs are clear. The visualized skeletal structures are
unremarkable.
IMPRESSION: No active cardiopulmonary disease.

## 2018-08-02 ENCOUNTER — Ambulatory Visit (INDEPENDENT_AMBULATORY_CARE_PROVIDER_SITE_OTHER): Payer: Medicaid Other | Admitting: Family Medicine

## 2018-08-02 ENCOUNTER — Other Ambulatory Visit (HOSPITAL_COMMUNITY)
Admission: RE | Admit: 2018-08-02 | Discharge: 2018-08-02 | Disposition: A | Discharge status: Home / Self Care | Payer: Medicaid Other | Source: Ambulatory Visit | Attending: Family Medicine | Admitting: Family Medicine

## 2018-08-02 ENCOUNTER — Encounter: Payer: Self-pay | Admitting: Family Medicine

## 2018-08-02 ENCOUNTER — Other Ambulatory Visit: Payer: Self-pay

## 2018-08-02 VITALS — BP 138/85 | HR 77 | Temp 98.7°F | Wt 174.0 lb

## 2018-08-02 DIAGNOSIS — N979 Female infertility, unspecified: Secondary | ICD-10-CM | POA: Diagnosis not present

## 2018-08-02 DIAGNOSIS — N898 Other specified noninflammatory disorders of vagina: Secondary | ICD-10-CM

## 2018-08-02 DIAGNOSIS — N809 Endometriosis, unspecified: Secondary | ICD-10-CM | POA: Diagnosis not present

## 2018-08-02 LAB — POCT WET PREP (WET MOUNT)
Clue Cells Wet Prep Whiff POC: NEGATIVE
TRICHOMONAS WET PREP HPF POC: ABSENT

## 2018-08-02 MED ORDER — METRONIDAZOLE 0.75 % VA GEL: 1.0000 | Freq: Two times a day (BID) | VAGINAL | 2 refills | Status: DC

## 2018-08-02 NOTE — Assessment & Plan Note (Signed)
  Patient has seen surgery in past and was told to not have surgery if she still wanted to have kids. She has been trying for past 1 year with partner to conceive. Will refer to infertility specialist at this point per patient's request.

## 2018-08-02 NOTE — Patient Instructions (Signed)
  Please use metrogel twice a day for 5 days then once a week at night to suppress recurrent BV.  I have also referred you to infertility specialist for management of difficulty conceiving. You will receive a call from our office to schedule this appointment. If you do not hear from our office in 1-2 weeks please call us to check on the status of your referral at [phone].   Lucila Maine, DO PGY-3, [facility] 08/02/2018 3:41 PM

## 2018-08-02 NOTE — Assessment & Plan Note (Signed)
  Wet prep with BV. Patient with recurrent BV. She had reaction to oral flagyl but tolerated vaginal suppository of metrogel prescribed in June without issue. Will give 5 day supply of metrogel to use twice a day for BV then use one suppository once weekly at night to suppress reccurent BV.  -gc/chlamydia pending will call patient with results.

## 2018-08-02 NOTE — Progress Notes (Signed)
    Subjective:    [name], female    DOB: Mar 11, 1991, 27 y.o.   MRN: [id number]   CC: STD check  HPI: reports having intercourse with partner and noticing milky discharge afterwards. Was unsure if this was related to her recent menses but was bothered by discharge so wants to be tested. Reports she usually gets BV frequently. Feels this is different. Denies changes in chronic pelvic pain (has endometriosis). No burning with urination or increased frequency. No CVA tenderness. No fevers or chills.   Smoking status reviewed- current smoker  Review of Systems- see HPI   Objective:  BP 138/85   Pulse 77   Temp 98.7 F (37.1 C) (Oral)   Wt 174 lb (78.9 kg)   LMP 07/24/2018   SpO2 99%   BMI 27.25 kg/m  Vitals and nursing note reviewed  General: well nourished, in no acute distress HEENT: normocephalic, MMM GU: normal external female genitalia without lesions. Moderate amount of thin white discharge present. Cervix without lesions. No CMT tenderness. Extremities: no edema or cyanosis. Skin: warm and dry, no rashes noted Neuro: alert and oriented, no focal deficits Psych: mood is happy, affect congruent   Assessment & Plan:    Vaginal discharge  Wet prep with BV. Patient with recurrent BV. She had reaction to oral flagyl but tolerated vaginal suppository of metrogel prescribed in June without issue. Will give 5 day supply of metrogel to use twice a day for BV then use one suppository once weekly at night to suppress reccurent BV.  -gc/chlamydia pending will call patient with results.  Endometriosis  Patient has seen surgery in past and was told to not have surgery if she still wanted to have kids. She has been trying for past 1 year with partner to conceive. Will refer to infertility specialist at this point per patient's request.     Return if symptoms worsen or fail to improve.   Lucila Maine, DO Family Medicine Resident PGY-3

## 2018-08-03 LAB — CERVICOVAGINAL ANCILLARY ONLY
Chlamydia: NEGATIVE
Neisseria Gonorrhea: NEGATIVE

## 2018-08-04 ENCOUNTER — Encounter: Payer: Self-pay | Admitting: *Deleted

## 2018-08-04 NOTE — Progress Notes (Signed)
Please let Amy Snyder know these tests are negative.

## 2018-09-16 ENCOUNTER — Encounter (HOSPITAL_COMMUNITY): Payer: Self-pay | Admitting: Student

## 2018-09-16 ENCOUNTER — Inpatient Hospital Stay (HOSPITAL_COMMUNITY)
Admission: AD | Admit: 2018-09-16 | Discharge: 2018-09-16 | Disposition: A | Discharge status: Home / Self Care | Payer: Medicaid Other | Source: Ambulatory Visit | Attending: Family Medicine | Admitting: Family Medicine

## 2018-09-16 DIAGNOSIS — Z3202 Encounter for pregnancy test, result negative: Secondary | ICD-10-CM | POA: Diagnosis not present

## 2018-09-16 DIAGNOSIS — I10 Essential (primary) hypertension: Secondary | ICD-10-CM | POA: Insufficient documentation

## 2018-09-16 DIAGNOSIS — N946 Dysmenorrhea, unspecified: Secondary | ICD-10-CM | POA: Diagnosis not present

## 2018-09-16 HISTORY — DX: Endometriosis, unspecified: N80.9

## 2018-09-16 LAB — URINALYSIS, ROUTINE W REFLEX MICROSCOPIC
Bacteria, UA: NONE SEEN
Bilirubin Urine: NEGATIVE
GLUCOSE, UA: NEGATIVE mg/dL
Ketones, ur: NEGATIVE mg/dL
NITRITE: NEGATIVE
Protein, ur: >=300 mg/dL — AB
RBC / HPF: >50 RBC/hpf — ABNORMAL HIGH (ref 0–5)
Specific Gravity, Urine: 1.025 (ref 1.005–1.030)
pH: 8 (ref 5.0–8.0)

## 2018-09-16 LAB — POCT PREGNANCY, URINE: Preg Test, Ur: NEGATIVE

## 2018-09-16 NOTE — MAU Note (Signed)
Pt presents to MAU complaints of a brown vaginal bleeding with lower abdominal cramping

## 2018-09-16 NOTE — Discharge Instructions (Signed)
Dysmenorrhea Dysmenorrhea means painful cramps during your period (menstrual period). You will have pain in your lower belly (abdomen). The pain is caused by the tightening (contracting) of the muscles of the womb (uterus). The pain may be mild or very bad. With this condition, you may:  Have a headache.  Feel sick to your stomach (nauseous).  Throw up (vomit).  Have lower back pain. Follow these instructions at home: Helping pain and cramping   Put heat on your lower back or belly when you have pain or cramps. Use the heat source that your doctor tells you to use.  Place a towel between your skin and the heat.  Leave the heat on for 20-30 minutes.  Remove the heat if your skin turns bright red. This is especially important if you cannot feel pain, heat, or cold.  Do not have a heating pad on during sleep.  Do aerobic exercises. These include walking, swimming, or biking. These may help with cramps.  Massage your lower back or belly. This may help lessen pain. General instructions   Take over-the-counter and prescription medicines only as told by your doctor.  Do not drive or use heavy machinery while taking prescription pain medicine.  Avoid alcohol and caffeine during and right before your period. These can make cramps worse.  Do not use any products that have nicotine or tobacco. These include cigarettes and e-cigarettes. If you need help quitting, ask your doctor.  Keep all follow-up visits as told by your doctor. This is important. Contact a doctor if:  You have pain that gets worse.  You have pain that does not get better with medicine.  You have pain during sex.  You feel sick to your stomach or you throw up during your period, and medicine does not help. Get help right away if:  You pass out (faint). Summary  Dysmenorrhea means painful cramps during your period (menstrual period).  Put heat on your lower back or belly when you have pain or cramps.  Do  exercises like walking, swimming, or biking to help with cramps.  Contact a doctor if you have pain during sex. This information is not intended to replace advice given to you by your health care provider. Make sure you discuss any questions you have with your health care provider. Document Released: 03/04/2009 Document Revised: 12/23/2016 Document Reviewed: 12/23/2016 Elsevier Interactive Patient Education  2017 Elsevier Inc[employer]  

## 2018-09-16 NOTE — MAU Note (Signed)
Amy Snyder is a 27 y.o.  here in MAU reporting: has been taking medication for BV. Took last dose today. Following this felt really hot like she was going to pass out. +vaginal bleeding--red and brown in color. +lower abdominal pain--cramping and constant (endorses hx endometriosis) LMP: 07/29/18. Denies taking HPT. Expresses desire to see if this is a miscarriage. Onset of complaint: started today Pain score: 10/10 Vitals:   09/16/18 1152  BP: (!) 132/99  Pulse: 69  Resp: 18  Temp: 98.1 F (36.7 C)  SpO2: 100%    Patients states she did not take her BP med this morning. Lab orders placed from triage: ua and pregnancy test

## 2018-09-16 NOTE — MAU Provider Note (Signed)
History    Amy Snyder is a 27 year old female in today with concerns that she might be miscarrying.  She has not had a home pregnancy test.  She has been cramping and bleeding now for 2 days.  Of note it is time for her menstrual cycle.  She is also chronic hypertension on medications but states she has not taken her medication in several days.  We had a lengthy discussion in regards to risk of hypertension and need to be compliant with her medications.  She informed me she was well aware. CSN: [id number]  Arrival date & time 09/16/18  1139   None     Chief Complaint  Patient presents with  . Abdominal Pain  . Possible Pregnancy    HPI  Past Medical History:  Diagnosis Date  . BV (bacterial vaginosis)   . Endometriosis    per biopsy done 11/2016  . Hypertension     Past Surgical History:  Procedure Laterality Date  . CESAREAN SECTION  Oct 21, 2009    Family History  Problem Relation Age of Onset  . Hypertension Mother   . Diabetes Unknown     Social History   Tobacco Use  . Smoking status: Current Some Day Smoker    Packs/day: 0.20    Years: 3.00    Pack years: 0.60    Types: Cigarettes  . Smokeless tobacco: Never Used  . Tobacco comment: smokes socially  Substance Use Topics  . Alcohol use: No    Alcohol/week: 0.0 standard drinks    Comment: socially  . Drug use: No    OB History    Gravida  1   Para  1   Term      Preterm  1   AB      Living  1     SAB      TAB      Ectopic      Multiple      Live Births  1           Review of Systems  Constitutional: Negative.   HENT: Negative.   Eyes: Negative.   Respiratory: Negative.   Cardiovascular: Negative.   Gastrointestinal: Positive for abdominal pain.  Endocrine: Negative.   Genitourinary: Positive for vaginal bleeding.  Musculoskeletal: Negative.   Skin: Negative.   Allergic/Immunologic: Negative.   Neurological: Negative.   Hematological: Negative.   Psychiatric/Behavioral:  Negative.     Allergies  Flagyl [metronidazole]  Home Medications    BP (!) 132/99 (BP Location: Right Arm)   Pulse 69   Temp 98.1 F (36.7 C) (Oral)   Resp 18   Wt 75.3 kg   LMP 07/29/2018   SpO2 100% Comment: ra  BMI 26.01 kg/m   Physical Exam  Constitutional: She is oriented to person, place, and time. She appears well-developed and well-nourished.  HENT:  Head: Normocephalic.  Eyes: Pupils are equal, round, and reactive to light.  Cardiovascular: Normal rate, regular rhythm, normal heart sounds and intact distal pulses.  Pulmonary/Chest: Effort normal and breath sounds normal.  Abdominal: Soft. Normal appearance and bowel sounds are normal.  Genitourinary: Vagina normal and uterus normal.  Neurological: She is alert and oriented to person, place, and time.  Skin: Skin is warm and dry.  Psychiatric: She has a normal mood and affect. Her behavior is normal.    MAU Course  Procedures (including critical care time)  Labs Reviewed  URINALYSIS, [place], URINE  No results found.   1. Dysmenorrhea       MDM  Dysmenorrhea pregnancy test negative.  Patient is to take Motrin 800 every 6-8 hours as needed cramping.  I discussed importance of being compliant with her blood pressure medications and the risk of heart attack and stroke involved.  Will discharge home

## 2018-10-09 ENCOUNTER — Telehealth: Payer: Self-pay

## 2018-10-09 NOTE — Telephone Encounter (Signed)
Returned call, left vm to call.

## 2018-10-13 ENCOUNTER — Encounter: Payer: Self-pay | Admitting: Family Medicine

## 2018-10-13 ENCOUNTER — Other Ambulatory Visit (HOSPITAL_COMMUNITY)
Admission: RE | Admit: 2018-10-13 | Discharge: 2018-10-13 | Disposition: A | Discharge status: Home / Self Care | Payer: Medicaid Other | Source: Ambulatory Visit | Attending: Family Medicine | Admitting: Family Medicine

## 2018-10-13 ENCOUNTER — Ambulatory Visit: Payer: Medicaid Other | Admitting: Family Medicine

## 2018-10-13 ENCOUNTER — Other Ambulatory Visit: Payer: Self-pay

## 2018-10-13 VITALS — BP 135/83 | HR 60 | Temp 98.0°F | Wt 169.0 lb

## 2018-10-13 DIAGNOSIS — B9689 Other specified bacterial agents as the cause of diseases classified elsewhere: Secondary | ICD-10-CM | POA: Diagnosis not present

## 2018-10-13 DIAGNOSIS — N898 Other specified noninflammatory disorders of vagina: Secondary | ICD-10-CM

## 2018-10-13 DIAGNOSIS — Z113 Encounter for screening for infections with a predominantly sexual mode of transmission: Secondary | ICD-10-CM | POA: Diagnosis not present

## 2018-10-13 DIAGNOSIS — N760 Acute vaginitis: Secondary | ICD-10-CM | POA: Diagnosis not present

## 2018-10-13 LAB — POCT WET PREP (WET MOUNT)
Clue Cells Wet Prep Whiff POC: POSITIVE
Trichomonas Wet Prep HPF POC: ABSENT

## 2018-10-13 MED ORDER — METRONIDAZOLE 0.75 % VA GEL: 1.0000 | Freq: Every day | VAGINAL | 0 refills | Status: AC

## 2018-10-13 NOTE — Patient Instructions (Signed)

## 2018-10-13 NOTE — Progress Notes (Signed)
Subjective:     [name], female   DOB: Apr 24, 1991, 27 y.o.   MRN: [id number]  Vaginal Discharge  The patient's primary symptoms include genital itching, a genital odor and vaginal discharge. This is a new problem. The current episode started 1 to 4 weeks ago (1 week). The problem occurs constantly. The problem has been gradually improving. Pain severity now: burning with scratching. No dysuria. Pregnant now: LMP: 10/09/18. Pertinent negatives include no abdominal pain, discolored urine, dysuria, fever, flank pain, frequency, painful intercourse, urgency or vomiting. The vaginal discharge was thick and white. She has not been passing clots. She has not been passing tissue. The symptoms are aggravated by intercourse. She has tried nothing for the symptoms. She is sexually active. It is unknown (Uses condoms irregular but had recent unproteected intercourse. She will like to get checked. Recent change in sexual partner.) whether or not her partner has an STD. She uses condoms for contraception. Her menstrual history has been regular. Her past medical history is significant for an STD.   Current Outpatient Medications on File Prior to Visit  Medication Sig Dispense Refill  . acetaminophen (TYLENOL) 500 MG tablet Take 1,000 mg by mouth every 6 (six) hours as needed for mild pain.    . carvedilol (COREG) 3.125 MG tablet Take 1 tablet (3.125 mg total) by mouth daily. 90 tablet 0  . clindamycin (CLEOCIN) 300 MG capsule Take 1 capsule (300 mg total) by mouth 3 (three) times daily. 21 capsule 0  . Homeopathic Products (HYLAFEM) SUPP Place 1 suppository vaginally at bedtime. 6 suppository 5  . hydrochlorothiazide (HYDRODIURIL) 25 MG tablet TAKE ONE TABLET BY MOUTH DAILY 60 tablet 3  . metroNIDAZOLE (METROGEL VAGINAL) 0.75 % vaginal gel Place 1 Applicatorful vaginally 2 (two) times daily. Use for 5 days BID then once weekly for suppression 140 g 2   No current facility-administered medications  on file prior to visit.    Past Medical History:  Diagnosis Date  . BV (bacterial vaginosis)   . Endometriosis    per biopsy done 11/2016  . Hypertension    Vitals:   10/13/18 0901  BP: 135/83  Pulse: 60  Temp: 98 F (36.7 C)  TempSrc: Oral  SpO2: 97%  Weight: 169 lb (76.7 kg)     Review of Systems  Constitutional: Negative for fever.  Respiratory: Negative.   Cardiovascular: Negative.   Gastrointestinal: Negative.  Negative for abdominal pain and vomiting.  Genitourinary: Positive for vaginal discharge. Negative for dysuria, flank pain, frequency and urgency.  All other systems reviewed and are negative.      Objective:   Physical Exam  Constitutional: She appears well-developed. No distress.  Cardiovascular: Normal rate, regular rhythm and normal heart sounds.  No murmur heard. Pulmonary/Chest: Effort normal and breath sounds normal. No stridor. No respiratory distress. She has no wheezes. She has no rales.  Abdominal: Soft. Bowel sounds are normal. She exhibits no distension and no mass. There is no tenderness. There is no guarding.  Genitourinary: Pelvic exam was performed with patient supine. There is no lesion on the right labia. There is no lesion on the left labia.    Nursing note and vitals reviewed.      Assessment:     Vaginitis STD exposure    Plan:     Wet prep completed and positive for BV. I called to discuss positive BP with her. Documented hx of Metronidazole allergy. She is unable to tell for sure that she has  an allergy.  She had used Metronidazole pills multiple times in the past as of 2016. No reaction to metrogel per patient. She is aware to pick up her Metronidazole gel.  HIV, RPR,GC/Chlamydia result pending. Regular use of condom recommended.

## 2018-10-14 LAB — RPR: RPR: NONREACTIVE

## 2018-10-14 LAB — HIV ANTIBODY (ROUTINE TESTING W REFLEX): HIV Screen 4th Generation wRfx: NONREACTIVE

## 2018-10-16 ENCOUNTER — Ambulatory Visit: Payer: Medicaid Other | Admitting: Obstetrics

## 2018-10-16 ENCOUNTER — Telehealth: Payer: Self-pay | Admitting: Family Medicine

## 2018-10-16 NOTE — Telephone Encounter (Signed)
Patient voiced understanding.  Lillionna Nabi,CMA ? ?

## 2018-10-16 NOTE — Telephone Encounter (Signed)
Please advise her that her wet prep is negative for yeast. May try OTC Clotrimazole vaginal cream if concern about yeast infection. Schedule f/u with her PCP soon for reassessment.

## 2018-10-16 NOTE — Telephone Encounter (Signed)
Pt is calling to check on the status of her results from her last visit. Pt would like someone to call her with these results.

## 2018-10-16 NOTE — Telephone Encounter (Signed)
Spoke with patient and informed her that HIV/ RPR are negative.  Her GC/CHL is still pending.  Patient states that she is still having a lot of vaginal itching and would like to know if she can get a one time dose of diflucan even though her test showed no yeast.  Informed her that I would check with MD and then let her know.  Jazmin Hartsell,CMA

## 2018-10-17 LAB — CERVICOVAGINAL ANCILLARY ONLY
CHLAMYDIA, DNA PROBE: NEGATIVE
NEISSERIA GONORRHEA: NEGATIVE

## 2018-10-18 NOTE — Telephone Encounter (Signed)
Patient informed of results. Jazmin Hartsell,CMA  

## 2018-10-18 NOTE — Telephone Encounter (Signed)
Pt is calling and said she spoke with Jazmin about the first part of her results but is waiting on a call for the "second part". Pt would like for someone to call her back

## 2018-10-20 ENCOUNTER — Other Ambulatory Visit (HOSPITAL_COMMUNITY)
Admission: RE | Admit: 2018-10-20 | Discharge: 2018-10-20 | Disposition: A | Discharge status: Home / Self Care | Payer: Medicaid Other | Source: Ambulatory Visit | Attending: Obstetrics | Admitting: Obstetrics

## 2018-10-20 ENCOUNTER — Encounter: Payer: Self-pay | Admitting: Obstetrics

## 2018-10-20 ENCOUNTER — Ambulatory Visit (INDEPENDENT_AMBULATORY_CARE_PROVIDER_SITE_OTHER): Payer: Medicaid Other | Admitting: Obstetrics

## 2018-10-20 VITALS — BP 130/89 | HR 70 | Ht 67.0 in | Wt 172.3 lb

## 2018-10-20 DIAGNOSIS — N898 Other specified noninflammatory disorders of vagina: Secondary | ICD-10-CM

## 2018-10-20 MED ORDER — FLUCONAZOLE 150 MG PO TABS: 150.0000 mg | ORAL_TABLET | Freq: Once | ORAL | 0 refills | Status: AC

## 2018-10-20 NOTE — Progress Notes (Signed)
Pt complains of vaginal itching, thick white discharge.

## 2018-10-20 NOTE — Progress Notes (Signed)
RGYN presents for problem visit today.  CC: possible yeast infection.

## 2018-10-20 NOTE — Progress Notes (Signed)
[name], female   DOB: 12/29/1990, 27 y.o.   MRN: [id number]  Chief Complaint  Patient presents with  . Vaginal Discharge    HPI Amy Snyder is a 27 y.o. female.  Vaginal irritation and white vaginal discharge. HPI  Past Medical History:  Diagnosis Date  . BV (bacterial vaginosis)   . Endometriosis    per biopsy done 11/2016  . Hypertension     Past Surgical History:  Procedure Laterality Date  . CESAREAN SECTION  Oct 21, 2009    Family History  Problem Relation Age of Onset  . Hypertension Mother   . Diabetes Unknown     Social History Social History   Tobacco Use  . Smoking status: Current Some Day Smoker    Packs/day: 0.20    Years: 3.00    Pack years: 0.60    Types: Cigarettes  . Smokeless tobacco: Never Used  . Tobacco comment: smokes socially  Substance Use Topics  . Alcohol use: No    Alcohol/week: 0.0 standard drinks    Comment: socially  . Drug use: No    Allergies  Allergen Reactions  . Flagyl [Metronidazole] Itching, Swelling and Rash    Current Outpatient Medications  Medication Sig Dispense Refill  . acetaminophen (TYLENOL) 500 MG tablet Take 1,000 mg by mouth every 6 (six) hours as needed for mild pain.    . carvedilol (COREG) 3.125 MG tablet Take 1 tablet (3.125 mg total) by mouth daily. 90 tablet 0  . hydrochlorothiazide (HYDRODIURIL) 25 MG tablet TAKE ONE TABLET BY MOUTH DAILY 60 tablet 3  . clindamycin (CLEOCIN) 300 MG capsule Take 1 capsule (300 mg total) by mouth 3 (three) times daily. (Patient not taking: Reported on 10/20/2018) 21 capsule 0  . fluconazole (DIFLUCAN) 150 MG tablet Take 1 tablet (150 mg total) by mouth once for 1 dose. 1 tablet 0  . Homeopathic Products (HYLAFEM) SUPP Place 1 suppository vaginally at bedtime. (Patient not taking: Reported on 10/20/2018) 6 suppository 5   No current facility-administered medications for this visit.     Review of Systems Review of Systems Constitutional: negative for  fatigue and weight loss Respiratory: negative for cough and wheezing Cardiovascular: negative for chest pain, fatigue and palpitations Gastrointestinal: negative for abdominal pain and change in bowel habits Genitourinary:positive for vaginal irritation and white vaginal discharge Integument/breast: negative for nipple discharge Musculoskeletal:negative for myalgias Neurological: negative for gait problems and tremors Behavioral/Psych: negative for abusive relationship, depression Endocrine: negative for temperature intolerance      Blood pressure 130/89, pulse 70, height 5\' 7"  (1.702 m), weight 172 lb 4.8 oz (78.2 kg), last menstrual period 10/09/2018.  Physical Exam Physical Exam           General:  Alert and no distress Abdomen:  normal findings: no organomegaly, soft, non-tender and no hernia  Pelvis:  External genitalia: normal general appearance Urinary system: urethral meatus normal and bladder without fullness, nontender Vaginal: normal without tenderness, induration or masses Cervix: normal appearance Adnexa: normal bimanual exam Uterus: anteverted and non-tender, normal size    50% of 15 min visit spent on counseling and coordination of care.   Data Reviewed Wet Prep  Assessment     1. Vaginal discharge Rx: - Cervicovaginal ancillary only - fluconazole (DIFLUCAN) 150 MG tablet; Take 1 tablet (150 mg total) by mouth once for 1 dose.  Dispense: 1 tablet; Refill: 0    Plan    FOLLOW UP PRN  Meds ordered this encounter  Medications  . fluconazole (DIFLUCAN) 150 MG tablet    Sig: Take 1 tablet (150 mg total) by mouth once for 1 dose.    Dispense:  1 tablet    Refill:  0    Shelly Bombard MD 10-20-2018

## 2018-10-23 LAB — CERVICOVAGINAL ANCILLARY ONLY
BACTERIAL VAGINITIS: NEGATIVE
Candida vaginitis: POSITIVE — AB
Chlamydia: NEGATIVE
Neisseria Gonorrhea: NEGATIVE
TRICH (WINDOWPATH): NEGATIVE

## 2018-10-24 ENCOUNTER — Other Ambulatory Visit: Payer: Self-pay | Admitting: Obstetrics

## 2018-10-24 DIAGNOSIS — B373 Candidiasis of vulva and vagina: Secondary | ICD-10-CM

## 2018-10-24 DIAGNOSIS — B3731 Acute candidiasis of vulva and vagina: Secondary | ICD-10-CM

## 2018-10-24 MED ORDER — FLUCONAZOLE 150 MG PO TABS: 150.0000 mg | ORAL_TABLET | Freq: Once | ORAL | 2 refills | Status: DC

## 2018-11-30 ENCOUNTER — Other Ambulatory Visit (HOSPITAL_COMMUNITY)
Admission: RE | Admit: 2018-11-30 | Discharge: 2018-11-30 | Disposition: A | Discharge status: Home / Self Care | Payer: Medicaid Other | Source: Ambulatory Visit | Attending: Family Medicine | Admitting: Family Medicine

## 2018-11-30 ENCOUNTER — Ambulatory Visit (INDEPENDENT_AMBULATORY_CARE_PROVIDER_SITE_OTHER): Payer: Medicaid Other | Admitting: Family Medicine

## 2018-11-30 ENCOUNTER — Other Ambulatory Visit: Payer: Self-pay | Admitting: Family Medicine

## 2018-11-30 ENCOUNTER — Encounter: Payer: Self-pay | Admitting: Family Medicine

## 2018-11-30 ENCOUNTER — Other Ambulatory Visit: Payer: Self-pay

## 2018-11-30 VITALS — BP 112/80 | HR 69 | Temp 98.6°F | Ht 67.0 in | Wt 147.0 lb

## 2018-11-30 DIAGNOSIS — N898 Other specified noninflammatory disorders of vagina: Secondary | ICD-10-CM | POA: Insufficient documentation

## 2018-11-30 LAB — POCT WET PREP (WET MOUNT)
CLUE CELLS WET PREP WHIFF POC: POSITIVE
TRICHOMONAS WET PREP HPF POC: ABSENT

## 2018-11-30 MED ORDER — METRONIDAZOLE 0.75 % VA GEL: 1.0000 | Freq: Two times a day (BID) | VAGINAL | 0 refills | Status: AC

## 2018-11-30 NOTE — Progress Notes (Signed)
   Subjective:    [name], female    DOB: 1991-04-30, 27 y.o.   MRN: [id number]   CC: STI testing  HPI: Last Sunday the patient had a sexual encounter with a partner who was rumored to have herpes or another STI.  Today she currently denies any abnormal discharge or odors.  She states she has a frequent history of bacterial vaginosis.  She has no other concerns today.  Smoking status reviewed: quit! Update phone number: [phone]  Review of Systems  Constitutional: Negative for fever.  Gastrointestinal: Negative for abdominal pain.  Genitourinary: Negative for dysuria, frequency and urgency.  Skin: Negative for itching and rash.   Objective:  BP 112/80   Pulse 69   Temp 98.6 F (37 C) (Oral)   Ht 5\' 7"  (1.702 m)   Wt 147 lb (66.7 kg)   LMP 10/29/2018   SpO2 99%   BMI 23.02 kg/m   Physical Exam Exam conducted with a chaperone present.  Cardiovascular:     Rate and Rhythm: Normal rate and regular rhythm.  Pulmonary:     Effort: Pulmonary effort is normal.     Breath sounds: Normal breath sounds.  Abdominal:     General: Bowel sounds are normal.     Palpations: Abdomen is soft.     Tenderness: There is no abdominal tenderness. There is no right CVA tenderness or left CVA tenderness.  Genitourinary:    General: Normal vulva.     Pubic Area: No rash.      Labia:        Right: No rash or lesion.        Left: No rash or lesion.      Vagina: Vaginal discharge (clear with yellow/green tint) and bleeding (to manipulation) present. No erythema, lesions or prolapsed vaginal walls.     Cervix: Cervical bleeding (to manipulation) present. No erythema.    Assessment & Plan:  Possible Exposure to STI /  Vaginal discharge -Testing for chlamydia, gonorrhea, trichomonas, bacterial vaginosis, and yeast were performed -Will contact patient with the results once available.  - Patient instructed to avoid soaps and other cleansers directly in and around the vagina  as this can off set the natural pH and cause certain bacteria to grow more prominently, causing vaginal odor and discharge. -Patient instructed to always wear condoms during sexual intercourse.  Return if symptoms worsen or fail to improve.  Dr. Milus Banister [facility], PGY-1

## 2018-11-30 NOTE — Patient Instructions (Addendum)
Thank you for coming in to see [place] today! Please see below to review our plan for today's visit:  1. We performed testing for chlamydia, gonorrhea, trichomonas, bacterial vaginosis, yeast, syphilis and HIV today. We will contact you with the results of your testing once they are available.  2. Avoid soaps and other cleansers directly in and around the vagina as this can off set the natural pH and cause certain bacteria to grow more prominently, causing vaginal odor and discharge. 3. Always wear condoms during sexual intercourse!  Please call the clinic at [phone] if your symptoms worsen or you have any concerns. It was our pleasure to serve you!    Dr. Milus Banister [facility]  Sexually Transmitted Disease A sexually transmitted disease (STD) is a disease or infection that may be passed (transmitted) from person to person, usually during sexual activity. This may happen by way of saliva, semen, blood, vaginal mucus, or urine. Common STDs include:  Gonorrhea.  Chlamydia.  Syphilis.  HIV and AIDS.  Genital herpes.  Hepatitis B and C.  Trichomonas.  Human papillomavirus (HPV).  Pubic lice.  Scabies.  Mites.  Bacterial vaginosis.  What are the causes? An STD may be caused by bacteria, a virus, or parasites. STDs are often transmitted during sexual activity if one person is infected. However, they may also be transmitted through nonsexual means. STDs may be transmitted after:  Sexual intercourse with an infected person.  Sharing sex toys with an infected person.  Sharing needles with an infected person or using unclean piercing or tattoo needles.  Having intimate contact with the genitals, mouth, or rectal areas of an infected person.  Exposure to infected fluids during birth.  What are the signs or symptoms? Different STDs have different symptoms. Some people may not have any symptoms. If symptoms are present, they may include:  Painful or  bloody urination.  Pain in the pelvis, abdomen, vagina, anus, throat, or eyes.  A skin rash, itching, or irritation.  Growths, ulcerations, blisters, or sores in the genital and anal areas.  Abnormal vaginal discharge with or without bad odor.  Penile discharge in men.  Fever.  Pain or bleeding during sexual intercourse.  Swollen glands in the groin area.  Yellow skin and eyes (jaundice). This is seen with hepatitis.  Swollen testicles.  Infertility.  Sores and blisters in the mouth.  How is this diagnosed? To make a diagnosis, your health care provider may:  Take a medical history.  Perform a physical exam.  Take a sample of any discharge to examine.  Swab the throat, cervix, opening to the penis, rectum, or vagina for testing.  Test a sample of your first morning urine.  Perform blood tests.  Perform a Pap test, if this applies.  Perform a colposcopy.  Perform a laparoscopy.  How is this treated? Treatment depends on the STD. Some STDs may be treated but not cured.  Chlamydia, gonorrhea, trichomonas, and syphilis can be cured with antibiotic medicine.  Genital herpes, hepatitis, and HIV can be treated, but not cured, with prescribed medicines. The medicines lessen symptoms.  Genital warts from HPV can be treated with medicine or by freezing, burning (electrocautery), or surgery. Warts may come back.  HPV cannot be cured with medicine or surgery. However, abnormal areas may be removed from the cervix, vagina, or vulva.  If your diagnosis is confirmed, your recent sexual partners need treatment. This is true even if they are symptom-free or have a negative culture  or evaluation. They should not have sex until their health care providers say it is okay.  Your health care provider may test you for infection again 3 months after treatment.  How is this prevented? Take these steps to reduce your risk of getting an STD:  Use latex condoms, dental dams, and  water-soluble lubricants during sexual activity. Do not use petroleum jelly or oils.  Avoid having multiple sex partners.  Do not have sex with someone who has other sex partners.  Do not have sex with anyone you do not know or who is at high risk for an STD.  Avoid risky sex practices that can break your skin.  Do not have sex if you have open sores on your mouth or skin.  Avoid drinking too much alcohol or taking illegal drugs. Alcohol and drugs can affect your judgment and put you in a vulnerable position.  Avoid engaging in oral and anal sex acts.  Get vaccinated for HPV and hepatitis. If you have not received these vaccines in the past, talk to your health care provider about whether one or both might be right for you.  If you are at risk of being infected with HIV, it is recommended that you take a prescription medicine daily to prevent HIV infection. This is called pre-exposure prophylaxis (PrEP). You are considered at risk if: ? You are a man who has sex with other men (MSM). ? You are a heterosexual man or woman and are sexually active with more than one partner. ? You take drugs by injection. ? You are sexually active with a partner who has HIV.  Talk with your health care provider about whether you are at high risk of being infected with HIV. If you choose to begin PrEP, you should first be tested for HIV. You should then be tested every 3 months for as long as you are taking PrEP.  Contact a health care provider if:  See your health care provider.  Tell your sexual partner(s). They should be tested and treated for any STDs.  Do not have sex until your health care provider says it is okay. Get help right away if: Contact your health care provider right away if:  You have severe abdominal pain.  You are a man and notice swelling or pain in your testicles.  You are a woman and notice swelling or pain in your vagina.  This information is not intended to replace  advice given to you by your health care provider. Make sure you discuss any questions you have with your health care provider. Document Released: 02/26/2003 Document Revised: 06/25/2016 Document Reviewed: 06/26/2013 Elsevier Interactive Patient Education  2018 [employer].

## 2018-11-30 NOTE — Assessment & Plan Note (Addendum)
-  Testing for chlamydia, gonorrhea, trichomonas, bacterial vaginosis, yeast, HIV and syphilis were performed -Will contact patient with the results once available.  - Patient instructed to avoid soaps and other cleansers directly in and around the vagina as this can off set the natural pH and cause certain bacteria to grow more prominently, causing vaginal odor and discharge. -Patient instructed to always wear condoms during sexual intercourse.

## 2018-12-01 LAB — CERVICOVAGINAL ANCILLARY ONLY
Chlamydia: NEGATIVE
NEISSERIA GONORRHEA: NEGATIVE

## 2018-12-01 LAB — HIV ANTIBODY (ROUTINE TESTING W REFLEX): HIV Screen 4th Generation wRfx: NONREACTIVE

## 2019-01-06 ENCOUNTER — Other Ambulatory Visit: Payer: Self-pay

## 2019-01-06 ENCOUNTER — Emergency Department (HOSPITAL_COMMUNITY)
Admission: EM | Admit: 2019-01-06 | Discharge: 2019-01-06 | Disposition: A | Discharge status: Home / Self Care | Payer: Medicaid Other | Attending: Emergency Medicine | Admitting: Emergency Medicine

## 2019-01-06 ENCOUNTER — Encounter (HOSPITAL_COMMUNITY): Payer: Self-pay | Admitting: Emergency Medicine

## 2019-01-06 DIAGNOSIS — I10 Essential (primary) hypertension: Secondary | ICD-10-CM | POA: Diagnosis not present

## 2019-01-06 DIAGNOSIS — F17210 Nicotine dependence, cigarettes, uncomplicated: Secondary | ICD-10-CM | POA: Insufficient documentation

## 2019-01-06 DIAGNOSIS — Z79899 Other long term (current) drug therapy: Secondary | ICD-10-CM | POA: Insufficient documentation

## 2019-01-06 DIAGNOSIS — L02412 Cutaneous abscess of left axilla: Secondary | ICD-10-CM | POA: Diagnosis not present

## 2019-01-06 DIAGNOSIS — L03114 Cellulitis of left upper limb: Secondary | ICD-10-CM | POA: Diagnosis not present

## 2019-01-06 MED ORDER — LIDOCAINE-EPINEPHRINE (PF) 2 %-1:200000 IJ SOLN
10.0000 mL | Freq: Once | INTRAMUSCULAR | Status: AC
  Administered 2019-01-06: 10 mL
  Filled 2019-01-06: qty 20

## 2019-01-06 MED ORDER — CEPHALEXIN 500 MG PO CAPS: 500.0000 mg | ORAL_CAPSULE | Freq: Four times a day (QID) | ORAL | 0 refills | Status: DC

## 2019-01-06 NOTE — ED Provider Notes (Signed)
[facility] EMERGENCY DEPARTMENT Provider Note   CSN: [id number] Arrival date & time: 01/06/19  1936     History   Chief Complaint No chief complaint on file.   HPI Amy Snyder is a 28 y.o. female who presents to ED for 3 day history of abscess under L armpit. She has similar "razor bumps" which are usually small and heal with time. This one has gotten bigger. Denies other trauma, drainage, fever.  HPI  Past Medical History:  Diagnosis Date  . BV (bacterial vaginosis)   . Endometriosis    per biopsy done 11/2016  . Hypertension     Patient Active Problem List   Diagnosis Date Noted  . 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 Endometriosis 12/06/2017  . Abnormal Pap smear of cervix 09/26/2016  . Headache 08/27/2016  . Vaginal discharge 06/01/2016  . Essential hypertension, benign 07/03/2014  . 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 Excessive or frequent menstruation 08/02/2013    Past Surgical History:  Procedure Laterality Date  . CESAREAN SECTION  Oct 21, 2009     OB History    Gravida  1   Para  1   Term      Preterm  1   AB      Living  1     SAB      TAB      Ectopic      Multiple      Live Births  1            Home Medications    Prior to Admission medications   Medication Sig Start Date End Date Taking? Authorizing Provider  acetaminophen (TYLENOL) 500 MG tablet Take 1,000 mg by mouth every 6 (six) hours as needed for mild pain.    [provider]  carvedilol (COREG) 3.125 MG tablet Take 1 tablet (3.125 mg total) by mouth daily. 10/14/17   Steve Rattler, DO  cephALEXin (KEFLEX) 500 MG capsule Take 1 capsule (500 mg total) by mouth 4 (four) times daily. 01/06/19   Malayla Granberry, PA-C  hydrochlorothiazide (HYDRODIURIL) 25 MG tablet TAKE ONE TABLET BY MOUTH DAILY 10/13/17   Steve Rattler, DO    Family History Family History  Problem Relation Age of Onset  . Hypertension Mother   . Diabetes  Other     Social History Social History   Tobacco Use  . Smoking status: Current Some Day Smoker    Packs/day: 0.20    Years: 3.00    Pack years: 0.60    Types: Cigarettes  . Smokeless tobacco: Never Used  . Tobacco comment: smokes socially  Substance Use Topics  . Alcohol use: No    Alcohol/week: 0.0 standard drinks    Comment: socially  . Drug use: No     Allergies   Flagyl [metronidazole]   Review of Systems Review of Systems  Constitutional: Negative for fever.  Skin: Positive for wound.  Neurological: Negative for numbness.     Physical Exam Updated Vital Signs BP (!) 144/96   Pulse 100   Temp 98.5 F (36.9 C) (Oral)   Resp 16   Ht 5\' 7"  (1.702 m)   Wt 79.8 kg   SpO2 100%   BMI 27.57 kg/m   Physical Exam Vitals signs and nursing note reviewed.  Constitutional:      General: She is not in acute distress.    Appearance: She is well-developed. She is not diaphoretic.  HENT:  Head: Normocephalic and atraumatic.  Eyes:     General: No scleral icterus.    Conjunctiva/sclera: Conjunctivae normal.  Neck:     Musculoskeletal: Normal range of motion.  Pulmonary:     Effort: Pulmonary effort is normal. No respiratory distress.  Skin:    Findings: No rash.     Comments: 2x2 area of induration and central fluctuance noted on L axilla. No overlying skin changes or drainage noted.  Neurological:     Mental Status: She is alert.      ED Treatments / Results  Labs (all labs ordered are listed, but only abnormal results are displayed) Labs Reviewed - No data to display  EKG None  Radiology No results found.  Procedures .Marland KitchenIncision and Drainage Date/Time: 01/06/2019 9:19 PM Performed by: Delia Heady, PA-C Authorized by: Delia Heady, PA-C   Consent:    Consent obtained:  Verbal   Consent given by:  Patient   Risks discussed:  Bleeding, damage to other organs, incomplete drainage, infection and pain   Alternatives discussed:  No  treatment Location:    Type:  Abscess   Location:  Upper extremity   Upper extremity location:  Arm   Arm location:  L upper arm Pre-procedure details:    Skin preparation:  Betadine Anesthesia (see MAR for exact dosages):    Anesthesia method:  Local infiltration   Local anesthetic:  Lidocaine 2% WITH epi Procedure details:    Needle aspiration: yes     Needle size:  18 G   Wound management:  Irrigated with saline   Drainage:  Purulent and bloody   Drainage amount:  Moderate   Packing materials:  None Post-procedure details:    Patient tolerance of procedure:  Procedure terminated at patient's request   (including critical care time)  EMERGENCY DEPARTMENT US SOFT TISSUE INTERPRETATION "Study: Limited Soft Tissue Ultrasound"  INDICATIONS: Pain Multiple views of the body part were obtained in real-time with a multi-frequency linear probe  PERFORMED BY: Myself IMAGES ARCHIVED?: No SIDE:Left BODY PART:Axilla INTERPRETATION:  Abcess present and Cellulitis present      Medications Ordered in ED Medications  lidocaine-EPINEPHrine (XYLOCAINE W/EPI) 2 %-1:200000 (PF) injection 10 mL (10 mLs Infiltration Given by Other 01/06/19 2034)     Initial Impression / Assessment and Plan / ED Course  I have reviewed the triage vital signs and the nursing notes.  Pertinent labs & imaging results that were available during my care of the patient were reviewed by me and considered in my medical decision making (see chart for details).      Patient with skin abscess.  I was able to perform needle aspiration resulting in 5 cc of purulent material.  Patient requests that I not perform incision secondary to her discomfort.  Abscess was not large enough to warrant packing or drain placement. No signs of systemic illness present. Advised patient to return for wound recheck in 2 days, sooner if signs of infection or increased bleeding/drainage noted. Supportive care and return precautions  discussed.  Pt sent home with Keflex. The patient appears reasonably screened and/or stabilized for discharge. Strict return precautions given.  Patient is hemodynamically stable, in NAD, and able to ambulate in the ED. Evaluation does not show pathology that would require ongoing emergent intervention or inpatient treatment. I explained the diagnosis to the patient. Pain has been managed and has no complaints prior to discharge. Patient is comfortable with above plan and is stable for discharge at this time. All questions were  answered prior to disposition. Strict return precautions for returning to the ED were discussed. Encouraged follow up with PCP.    Portions of this note were generated with Lobby[employer]. Dictation errors may occur despite best attempts at proofreading.   Final Clinical Impressions(s) / ED Diagnoses   Final diagnoses:  Abscess of axilla, left    ED Discharge Orders         Ordered    cephALEXin (KEFLEX) 500 MG capsule  4 times daily     01/06/19 2113           Delia Heady, PA-C 01/06/19 2121    Malvin Johns, MD 01/06/19 2250

## 2019-01-06 NOTE — Discharge Instructions (Signed)
Return to ED for worsening symptoms, increased swelling, redness or drainage.

## 2019-01-06 NOTE — ED Triage Notes (Signed)
Pt complains of abcess under left armpit. Pt states it has grown bigger over three days.

## 2019-01-09 ENCOUNTER — Other Ambulatory Visit: Payer: Self-pay

## 2019-01-09 ENCOUNTER — Emergency Department (HOSPITAL_COMMUNITY)
Admission: EM | Admit: 2019-01-09 | Discharge: 2019-01-09 | Disposition: A | Discharge status: Home / Self Care | Payer: Medicaid Other | Attending: Emergency Medicine | Admitting: Emergency Medicine

## 2019-01-09 DIAGNOSIS — Z79899 Other long term (current) drug therapy: Secondary | ICD-10-CM | POA: Diagnosis not present

## 2019-01-09 DIAGNOSIS — F17210 Nicotine dependence, cigarettes, uncomplicated: Secondary | ICD-10-CM | POA: Insufficient documentation

## 2019-01-09 DIAGNOSIS — L02412 Cutaneous abscess of left axilla: Secondary | ICD-10-CM | POA: Insufficient documentation

## 2019-01-09 DIAGNOSIS — I10 Essential (primary) hypertension: Secondary | ICD-10-CM | POA: Diagnosis not present

## 2019-01-09 MED ORDER — CEPHALEXIN 500 MG PO CAPS: 500.0000 mg | ORAL_CAPSULE | Freq: Four times a day (QID) | ORAL | 0 refills | Status: DC

## 2019-01-09 MED ORDER — ACETAMINOPHEN 325 MG PO TABS
650.0000 mg | ORAL_TABLET | Freq: Once | ORAL | Status: AC
  Administered 2019-01-09: 650 mg via ORAL
  Filled 2019-01-09: qty 2

## 2019-01-09 MED ORDER — LIDOCAINE-EPINEPHRINE (PF) 2 %-1:200000 IJ SOLN
10.0000 mL | Freq: Once | INTRAMUSCULAR | Status: AC
  Administered 2019-01-09: 10 mL
  Filled 2019-01-09: qty 20

## 2019-01-09 MED ORDER — LIDOCAINE-EPINEPHRINE-TETRACAINE (LET) SOLUTION
3.0000 mL | Freq: Once | NASAL | Status: AC
  Administered 2019-01-09: 3 mL via TOPICAL
  Filled 2019-01-09: qty 3

## 2019-01-09 NOTE — ED Triage Notes (Signed)
Pt reports left arm abcess has not improved since being seen Saturday.

## 2019-01-09 NOTE — Discharge Instructions (Addendum)
You have been seen today for an abscess. Please read and follow all provided instructions.   1. Medications: Keflex (antibiotic), tylenol/ibuprofen for pain, usual home medications 2. Treatment: rest, drink plenty of fluids, warm compress 3. Follow Up: Please follow up with your primary doctor in 2 days for discussion of your diagnoses, wound check, and further evaluation after today's visit; if you do not have a primary care doctor use the resource guide provided to find one; Please return to the ER for any new or worsening symptoms. Please obtain all of your results from medical records or have your doctors office obtain the results - share them with your doctor - you should be seen at your doctors office. Call today to arrange your follow up.   Take medications as prescribed. Please review all of the medicines and only take them if you do not have an allergy to them. Return to the emergency room for worsening condition or new concerning symptoms. Follow up with your regular doctor. If you don't have a regular doctor use one of the numbers below to establish a primary care doctor.  Please be aware that if you are taking birth control pills, taking other prescriptions, ESPECIALLY ANTIBIOTICS may make the birth control ineffective - if this is the case, either do not engage in sexual activity or use alternative methods of birth control such as condoms until you have finished the medicine and your family doctor says it is OK to restart them. If you are on a blood thinner such as COUMADIN, be aware that any other medicine that you take may cause the coumadin to either work too much, or not enough - you should have your coumadin level rechecked in next 7 days if this is the case.  ?  It is also a possibility that you have an allergic reaction to any of the medicines that you have been prescribed - Everybody reacts differently to medications and while MOST people have no trouble with most medicines, you may  have a reaction such as nausea, vomiting, rash, swelling, shortness of breath. If this is the case, please stop taking the medicine immediately and contact your physician.  ?  You should return to the ER if you develop severe or worsening symptoms.   Emergency Department Resource Guide 1) Find a Doctor and Pay Out of Pocket Although you won't have to find out who is covered by your insurance plan, it is a good idea to ask around and get recommendations. You will then need to call the office and see if the doctor you have chosen will accept you as a new patient and what types of options they offer for patients who are self-pay. Some doctors offer discounts or will set up payment plans for their patients who do not have insurance, but you will need to ask so you aren't surprised when you get to your appointment.  2) Contact Your Local Health Department Not all health departments have doctors that can see patients for sick visits, but many do, so it is worth a call to see if yours does. If you don't know where your local health department is, you can check in your phone book. The CDC also has a tool to help you locate your state's health department, and many state websites also have listings of all of their local health departments.  3) Find a [facility] If your illness is not likely to be very severe or complicated, you may want to try a  walk in clinic. These are popping up all over the country in pharmacies, drugstores, and shopping centers. They're usually staffed by nurse practitioners or physician assistants that have been trained to treat common illnesses and complaints. They're usually fairly quick and inexpensive. However, if you have serious medical issues or chronic medical problems, these are probably not your best option.  No Primary Care Doctor: Call Health Connect at  [phone] - they can help you locate a primary care doctor that  accepts your insurance, provides certain services,  etc. Physician Referral [phone]  Emergency Department Resource Guide 1) Find a Doctor and Pay Out of Pocket Although you won't have to find out who is covered by your insurance plan, it is a good idea to ask around and get recommendations. You will then need to call the office and see if the doctor you have chosen will accept you as a new patient and what types of options they offer for patients who are self-pay. Some doctors offer discounts or will set up payment plans for their patients who do not have insurance, but you will need to ask so you aren't surprised when you get to your appointment.  2) Contact Your Local Health Department Not all health departments have doctors that can see patients for sick visits, but many do, so it is worth a call to see if yours does. If you don't know where your local health department is, you can check in your phone book. The CDC also has a tool to help you locate your state's health department, and many state websites also have listings of all of their local health departments.  3) Find a [facility] If your illness is not likely to be very severe or complicated, you may want to try a walk in clinic. These are popping up all over the country in pharmacies, drugstores, and shopping centers. They're usually staffed by nurse practitioners or physician assistants that have been trained to treat common illnesses and complaints. They're usually fairly quick and inexpensive. However, if you have serious medical issues or chronic medical problems, these are probably not your best option.  No Primary Care Doctor: Call Health Connect at  [phone] - they can help you locate a primary care doctor that  accepts your insurance, provides certain services, etc. Physician Referral Service- [phone]  Chronic Pain Problems: Organization         Address  Phone   Notes  [facility]  [phone] Patients need to be referred by their  primary care doctor.   Medication Assistance: Organization         Address  Phone   Notes  [facility] Medication [facility] [address] [place], [place] [address] [phone] --Must be a resident of [facility] -- Must have NO insurance coverage whatsoever (no Medicaid/ Medicare, etc.) -- The pt. MUST have a primary care doctor that directs their care regularly and follows them in the community   [facility]  [phone]   [employer]  [phone]    Agencies that provide inexpensive medical care: Organization         Address  Phone   Notes  [place]  [phone]   [facility]    [phone]   [facility] [place], [place] [address] [phone]   [place] [address], [place] [phone]   [facility]    [phone]   [facility]    [phone]   [facility] [address], [place] Phone:  [phone], Fax:  [phone] Hours of Operation:  9 am - 6 pm, M-F.  Also accepts Medicaid/Medicare and self-pay.  [facility] for [place], [address], [place] Phone: [phone], Fax: [phone]. Hours of Operation:  8:30 am - 5:30 pm, M-F.  Also accepts Medicaid and self-pay.  [facility] [place] [address], [place] Phone: [phone]   [place], [place], [place] [phone], [address]. [phone] Mondays & Thursdays: 7-9 AM.  First 15 patients are seen on a first come, first serve basis.    Halfway Providers:  Organization         Address  Phone   Notes  [facility] [address], [place] [phone] Also accepts self-pay patients.  [facility] [address], [place]  [phone]   [place], [address], [place] [phone]   [facility] [address] [place] [phone]   Lucianne Lei [address], [place]   [phone] Only accepts [place] [facility] [place] patients after they have their name applied to their card.   Self-Pay (no insurance) in [facility]:  Organization         Address  Phone   Notes  Sickle Cell Patients, [facility] [place] [phone]   [facility] [place] [phone]   [facility] [place]  [place], [place], [place] [phone]   [facility]/Dr. Osei-Bonsu  [address], [place] or [address], [place] [phone] Phone number for both [place] and [place] locations is the same.  [facility] [address] [place] [phone]   [facility] [address], [place] or [address] [phone]   [facility] [address] [place] [phone], phone; [phone], fax Sees patients 1st and 3rd Saturday of every month.  Must not qualify for public or private insurance (i.e. Medicaid, Medicare, [place] Health Choice, Veterans' Benefits)  Household income should be no more than 200% of the poverty level The clinic cannot treat you if you are pregnant or think you are pregnant  Sexually transmitted diseases are not treated at the clinic.

## 2019-01-09 NOTE — ED Notes (Signed)
Patient verbalizes understanding of discharge instructions. Opportunity for questioning and answers were provided. Armband removed by staff, pt discharged from ED. Pt ambulatory to lobby. Prescriptions, wound care, and follow up care reviewed. Pt given supplies for dressing changes.

## 2019-01-09 NOTE — ED Provider Notes (Signed)
[facility] EMERGENCY DEPARTMENT Provider Note   CSN: [id number] Arrival date & time: 01/09/19  2094     History   Chief Complaint Chief Complaint  Patient presents with  . Abscess    HPI Amy Snyder is a 28 y.o. female presenting with a left axilla abscess onset 1 week ago after shaving. Patient reports she was evaluated on 1/18 and had a needle aspiration due to patient's request. Patient was discharged on Keflex and states patient has had 3 days of antibiotics. Patient states she had a small amount of drainage, but abscess has remained the same. Patient reports tenderness to palpation and describes pain as an ache. Patient states she has been taking ibuprofen with relief. Patient reports mild erythema and edema over area. Patient denies a history of diabetes or previous abscesses. Patient denies fever, chills, abdominal pain, or vomiting.   HPI  Past Medical History:  Diagnosis Date  . BV (bacterial vaginosis)   . Endometriosis    per biopsy done 11/2016  . Hypertension     Patient Active Problem List   Diagnosis Date Noted  . 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 Endometriosis 12/06/2017  . Abnormal Pap smear of cervix 09/26/2016  . Headache 08/27/2016  . Vaginal discharge 06/01/2016  . Essential hypertension, benign 07/03/2014  . 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 Excessive or frequent menstruation 08/02/2013    Past Surgical History:  Procedure Laterality Date  . CESAREAN SECTION  Oct 21, 2009     OB History    Gravida  1   Para  1   Term      Preterm  1   AB      Living  1     SAB      TAB      Ectopic      Multiple      Live Births  1            Home Medications    Prior to Admission medications   Medication Sig Start Date End Date Taking? Authorizing Provider  acetaminophen (TYLENOL) 500 MG tablet Take 1,000 mg by mouth every 6 (six) hours as needed for mild pain.    [provider]    carvedilol (COREG) 3.125 MG tablet Take 1 tablet (3.125 mg total) by mouth daily. 10/14/17   Steve Rattler, DO  cephALEXin (KEFLEX) 500 MG capsule Take 1 capsule (500 mg total) by mouth 4 (four) times daily for 5 days. 01/09/19 01/14/19  Darlin Drop P, PA-C  hydrochlorothiazide (HYDRODIURIL) 25 MG tablet TAKE ONE TABLET BY MOUTH DAILY 10/13/17   Steve Rattler, DO    Family History Family History  Problem Relation Age of Onset  . Hypertension Mother   . Diabetes Other     Social History Social History   Tobacco Use  . Smoking status: Current Some Day Smoker    Packs/day: 0.20    Years: 3.00    Pack years: 0.60    Types: Cigarettes  . Smokeless tobacco: Never Used  . Tobacco comment: smokes socially  Substance Use Topics  . Alcohol use: No    Alcohol/week: 0.0 standard drinks    Comment: socially  . Drug use: No     Allergies   Flagyl [metronidazole]   Review of Systems Review of Systems  Constitutional: Negative for chills, diaphoresis and fever.  Respiratory: Negative for shortness of breath.   Cardiovascular: Negative for chest pain.  Gastrointestinal: Negative  for abdominal pain, nausea and vomiting.  Endocrine: Negative for cold intolerance and heat intolerance.  Musculoskeletal: Negative for arthralgias and joint swelling.  Skin: Positive for color change and wound.  Allergic/Immunologic: Negative for immunocompromised state.  Neurological: Negative for weakness.  Hematological: Negative for adenopathy.     Physical Exam Updated Vital Signs BP (!) 148/99   Pulse 78   Temp 98.3 F (36.8 C) (Oral)   Resp 14   Ht 5\' 7"  (1.702 m)   Wt 79 kg   SpO2 100%   BMI 27.28 kg/m   Physical Exam Vitals signs and nursing note reviewed.  Constitutional:      General: She is not in acute distress.    Appearance: She is well-developed. She is not diaphoretic.  HENT:     Head: Normocephalic and atraumatic.  Neck:     Musculoskeletal: Normal range of  motion and neck supple.  Cardiovascular:     Rate and Rhythm: Normal rate and regular rhythm.     Heart sounds: Normal heart sounds. No murmur. No friction rub. No gallop.   Pulmonary:     Effort: Pulmonary effort is normal. No respiratory distress.     Breath sounds: Normal breath sounds. No wheezing or rales.  Abdominal:     Palpations: Abdomen is soft.     Tenderness: There is no abdominal tenderness.  Musculoskeletal: Normal range of motion.  Skin:    Findings: Abscess and erythema present. No rash.     Comments: 3cm x 2cm abscess noted over left axilla. Area is tender to palpation and fluctuant. Mild erythema noted. No active drainage at this time.  Neurological:     Mental Status: She is alert.         ED Treatments / Results  Labs (all labs ordered are listed, but only abnormal results are displayed) Labs Reviewed - No data to display  EKG None  Radiology No results found.  Procedures .Marland KitchenIncision and Drainage Date/Time: 01/09/2019 11:18 AM Performed by: Arville Lime, PA-C Authorized by: Arville Lime, PA-C   Consent:    Consent obtained:  Verbal   Consent given by:  Patient   Risks discussed:  Bleeding, incomplete drainage and infection   Alternatives discussed:  No treatment Location:    Type:  Abscess   Size:  3cm x 2cm   Location: Left axilla. Pre-procedure details:    Skin preparation:  Betadine Anesthesia (see MAR for exact dosages):    Anesthesia method:  Local infiltration and topical application   Topical anesthetic:  LET   Local anesthetic:  Lidocaine 2% WITH epi Procedure type:    Complexity:  Simple Procedure details:    Incision types:  Stab incision   Scalpel blade:  11   Wound management:  Probed and deloculated   Drainage:  Purulent   Drainage amount:  Moderate   Wound treatment:  Wound left open   Packing materials:  None Post-procedure details:    Patient tolerance of procedure:  Tolerated well, no immediate  complications   (including critical care time)  Medications Ordered in ED Medications  lidocaine-EPINEPHrine (XYLOCAINE W/EPI) 2 %-1:200000 (PF) injection 10 mL (10 mLs Infiltration Given 01/09/19 1045)  lidocaine-EPINEPHrine-tetracaine (LET) solution (3 mLs Topical Given 01/09/19 1045)  acetaminophen (TYLENOL) tablet 650 mg (650 mg Oral Given 01/09/19 1130)     Initial Impression / Assessment and Plan / ED Course  I have reviewed the triage vital signs and the nursing notes.  Pertinent labs & imaging  results that were available during my care of the patient were reviewed by me and considered in my medical decision making (see chart for details).  Clinical Course as of Jan 09 1142  Tue Jan 09, 2019  1119 Patient reports feeling very anxious about I&D. Patient denies any other complaints.  BP(!): 152/103 [AH]    Clinical Course User Index [AH] Arville Lime, PA-C   Patient with skin abscess amenable to incision and drainage.  Abscess was not large enough to warrant packing or drain,  wound recheck in 2 days. Encouraged home warm soaks and flushing.  Mild signs of cellulitis is surrounding skin.  Will d/c to home. Will encourage patient to continue antibiotic therapy.    Final Clinical Impressions(s) / ED Diagnoses   Final diagnoses:  Abscess of left axilla    ED Discharge Orders         Ordered    cephALEXin (KEFLEX) 500 MG capsule  4 times daily     01/09/19 1122           Darlin Drop [place], [place] 01/09/19 [place], [place], DO 01/09/19 1411

## 2019-01-11 ENCOUNTER — Ambulatory Visit (INDEPENDENT_AMBULATORY_CARE_PROVIDER_SITE_OTHER): Payer: Medicaid Other | Admitting: Family Medicine

## 2019-01-11 ENCOUNTER — Other Ambulatory Visit: Payer: Self-pay

## 2019-01-11 ENCOUNTER — Other Ambulatory Visit (HOSPITAL_COMMUNITY)
Admission: RE | Admit: 2019-01-11 | Discharge: 2019-01-11 | Disposition: A | Discharge status: Home / Self Care | Payer: Medicaid Other | Source: Ambulatory Visit | Attending: Family Medicine | Admitting: Family Medicine

## 2019-01-11 VITALS — BP 110/70 | HR 72 | Temp 98.2°F | Wt 176.0 lb

## 2019-01-11 DIAGNOSIS — Z7251 High risk heterosexual behavior: Secondary | ICD-10-CM | POA: Insufficient documentation

## 2019-01-11 DIAGNOSIS — I10 Essential (primary) hypertension: Secondary | ICD-10-CM | POA: Diagnosis not present

## 2019-01-11 DIAGNOSIS — Z3201 Encounter for pregnancy test, result positive: Secondary | ICD-10-CM | POA: Diagnosis not present

## 2019-01-11 LAB — POCT WET PREP (WET MOUNT)
CLUE CELLS WET PREP WHIFF POC: NEGATIVE
Trichomonas Wet Prep HPF POC: ABSENT

## 2019-01-11 LAB — POCT URINE PREGNANCY: PREG TEST UR: POSITIVE — AB

## 2019-01-11 MED ORDER — LABETALOL HCL 100 MG PO TABS: 100.0000 mg | ORAL_TABLET | Freq: Two times a day (BID) | ORAL | 1 refills | Status: DC

## 2019-01-11 NOTE — Progress Notes (Signed)
    Subjective:    Patient ID: Shamariah Shewmake, female    DOB: [birth year], 28 y.o.   MRN: [id number]   CC: positive at home pregnancy test  HPI: 28 year old with hx of endometriosis and HTN on HCTZ and coreg presenting because she had a positive at home pregnancy test this week and wants confirmation. LMP was Nov 30, 2018. She reports she is taking metrogel currently for BV and today is last day. She wants to be checked for STDs as she had unprotected intercourse Dec 25 and since she got pregnant from that wants to make sure there is no STD as well. She was not planning on getting pregnant. She is unsure if she will keep the pregnancy. She went to [facility] for her last pregnancy and would go there for prenatal care if she planned to keep it. She reports some intermittent menstrual cramp type sensation. She denies headaches, vision changes, RUQ pain, leg swelling.  Smoking status reviewed- former smoker  Review of Systems- no vaginal bleeding, no pelvic pain, no vomiting, nausea, fevers, chills   Objective:  BP 110/70   Pulse 72   Temp 98.2 F (36.8 C) (Oral)   Wt 176 lb (79.8 kg)   LMP 12/03/2018   BMI 27.57 kg/m  Vitals and nursing note reviewed  General: well nourished, in no acute distress HEENT: normocephalic, MMM Cardiac: regular rate Respiratory: normal work of breathing Abdomen: soft, nontender GU: normal external female genitalia. Moderate amount of gel in vaginal canal, thick white discharge also present. No CMT.  Extremities: no edema or cyanosis Neuro: alert and oriented, no focal deficits   Assessment & Plan:    Unprotected sex  Wet prep negative, gc/chlamydia sent to lab  Positive pregnancy test  Patient unsure if she will keep this pregnancy. Information given about termination. She would go to [facility] if kept pregnancy. Asked her to follow up with them in next week for initial OB visit if she will continue pregnancy given high risk pregnancy with existing HTN.  Will obtain US to determine location of pregnancy as patient has been having intermittent pelvic pain. Initial OB labs collected and urine sent for culture. Reasons to go to [facility] reviewed in detail. Patient verbalized understanding and agreement with plan.   Essential hypertension, benign  Discontinued hctz and coreg, started labetalol 100 mg BID, return Monday for BP check and follow up with OB ([facility]) in next week. Patient verbalized understanding and agreement with plan.     Return in about 4 days (around 01/15/2019) for initial OB.   Lucila Maine, DO Family Medicine Resident PGY-3

## 2019-01-11 NOTE — Assessment & Plan Note (Signed)
  Discontinued hctz and coreg, started labetalol 100 mg BID, return Monday for BP check and follow up with OB ([facility]) in next week. Patient verbalized understanding and agreement with plan.

## 2019-01-11 NOTE — Patient Instructions (Addendum)
  Please stop taking HCTZ and coreg and begin labetalol twice a day for blood pressure control, return to see [place] Monday at 2 for blood pressure check. Please call [facility] for appointment.   Your ultrasound is Thursday to confirm location of pregnancy.  Pregnancy termination information:  [facility] Address: [address] [place], [place] [address] Phone: [phone] Website: [email or web address]  Please go to the MAU at [facility] if you experience any bleeding or pelvic pain.  If you have questions or concerns please do not hesitate to call at [phone].  Lucila Maine, DO PGY-3, [facility] 01/11/2019 4:11 PM

## 2019-01-11 NOTE — Assessment & Plan Note (Addendum)
  Patient unsure if she will keep this pregnancy. Information given about termination. She would go to [facility] if kept pregnancy. Asked her to follow up with them in next week for initial OB visit if she will continue pregnancy given high risk pregnancy with existing HTN. Will obtain US to determine location of pregnancy as patient has been having intermittent pelvic pain. Initial OB labs collected and urine sent for culture. Reasons to go to [facility] reviewed in detail. Patient verbalized understanding and agreement with plan.

## 2019-01-11 NOTE — Assessment & Plan Note (Signed)
  Wet prep negative, gc/chlamydia sent to lab

## 2019-01-13 LAB — CULTURE, OB URINE

## 2019-01-13 LAB — URINE CULTURE, OB REFLEX: Organism ID, Bacteria: NO GROWTH

## 2019-01-15 ENCOUNTER — Ambulatory Visit: Payer: Medicaid Other

## 2019-01-15 DIAGNOSIS — I10 Essential (primary) hypertension: Secondary | ICD-10-CM

## 2019-01-15 LAB — CERVICOVAGINAL ANCILLARY ONLY
CHLAMYDIA, DNA PROBE: NEGATIVE
Neisseria Gonorrhea: NEGATIVE

## 2019-01-15 NOTE — Progress Notes (Signed)
Pt presents in nurse clinic for BP check due to medication changes. Pt stated she was confused, and didn't realize she was suppose to start a new BP med (labetalol.) Pt thought that since she is pregnant she couldn't be on BP medications. Pt has stopped taking HCTZ and Carvedilol, since finding out about pregnancy at last weeks visit.  BP today 132/85 checked in left arm with large cuff.   Plan discussed at length with patient to start taking her new BP medication (labetalol) ASAP. Pt verbalized understanding.   Will route note to PCP.

## 2019-01-16 LAB — OBSTETRIC PANEL, INCLUDING HIV
ANTIBODY SCREEN: NEGATIVE
BASOS ABS: 0 10*3/uL (ref 0.0–0.2)
BASOS: 0 %
EOS (ABSOLUTE): 0.1 10*3/uL (ref 0.0–0.4)
Eos: 1 %
HIV Screen 4th Generation wRfx: NONREACTIVE
Hematocrit: 36.5 % (ref 34.0–46.6)
Hemoglobin: 12 g/dL (ref 11.1–15.9)
Hepatitis B Surface Ag: NEGATIVE
IMMATURE GRANS (ABS): 0 10*3/uL (ref 0.0–0.1)
IMMATURE GRANULOCYTES: 0 %
LYMPHS: 32 %
Lymphocytes Absolute: 2.9 10*3/uL (ref 0.7–3.1)
MCH: 26.5 pg — ABNORMAL LOW (ref 26.6–33.0)
MCHC: 32.9 g/dL (ref 31.5–35.7)
MCV: 81 fL (ref 79–97)
MONOCYTES: 7 %
MONOS ABS: 0.6 10*3/uL (ref 0.1–0.9)
NEUTROS PCT: 60 %
Neutrophils Absolute: 5.5 10*3/uL (ref 1.4–7.0)
PLATELETS: 305 10*3/uL (ref 150–450)
RBC: 4.52 x10E6/uL (ref 3.77–5.28)
RDW: 13.5 % (ref 11.7–15.4)
RPR Ser Ql: NONREACTIVE
Rh Factor: POSITIVE
Rubella Antibodies, IGG: <0.9 index — ABNORMAL LOW (ref >0.99)
WBC: 9.1 10*3/uL (ref 3.4–10.8)

## 2019-01-16 LAB — HGB FRAC. W/SOLUBILITY
HGB F QUANT: 0 % (ref 0.0–2.0)
HGB S: 0 %
HGB SOLUBILITY: NEGATIVE
Hgb A2 Quant: 2 % (ref 1.8–3.2)
Hgb A: 98 % (ref 96.4–98.8)
Hgb C: 0 %
Hgb Variant: 0 %

## 2019-01-18 ENCOUNTER — Ambulatory Visit (HOSPITAL_COMMUNITY)
Admission: RE | Admit: 2019-01-18 | Discharge: 2019-01-18 | Disposition: A | Discharge status: Home / Self Care | Payer: Medicaid Other | Source: Ambulatory Visit | Attending: Family Medicine | Admitting: Family Medicine

## 2019-01-18 ENCOUNTER — Other Ambulatory Visit: Payer: Self-pay | Admitting: Family Medicine

## 2019-01-18 ENCOUNTER — Telehealth: Payer: Self-pay | Admitting: *Deleted

## 2019-01-18 DIAGNOSIS — Z3201 Encounter for pregnancy test, result positive: Secondary | ICD-10-CM | POA: Insufficient documentation

## 2019-01-18 DIAGNOSIS — Z3491 Encounter for supervision of normal pregnancy, unspecified, first trimester: Secondary | ICD-10-CM

## 2019-01-18 DIAGNOSIS — Z3A01 Less than 8 weeks gestation of pregnancy: Secondary | ICD-10-CM | POA: Diagnosis not present

## 2019-01-18 DIAGNOSIS — N858 Other specified noninflammatory disorders of uterus: Secondary | ICD-10-CM

## 2019-01-18 NOTE — Telephone Encounter (Signed)
Call report received and message sent to Dr. Vanetta Shawl and Dr. Erin Hearing.  Radiology wanted to call attention to the soft tissue mass found on it.  Text also sent to Dr. Vanetta Shawl as she is the provider who saw patient.  Jazmin Hartsell,CMA

## 2019-01-19 NOTE — Telephone Encounter (Signed)
  Attempted to call Ms. Steffek about her ultrasound results. Left voicemail for her to call back.  Her US shows an extra-uterine mass of uncertain etiology, the recommendation was to repeat US imaging and if this does not resolve to do more work up.   Patient should have a repeat US in 4 weeks.   Patient will not be having prenatal care here as she is a high risk patient due to chronic hypertension.  If patient calls back, Please ask patient and confirm she has upcoming appointment with [facility] to monitor pregnancy and blood pressure. Please tell her I have ordered a repeat US for 4 weeks to monitor fetal growth and the extra-uterine mass.   If she does not have an appointment with [facility] she needs to come in for a blood pressure check with [place].   I will continue to try and reach her.  Lucila Maine, DO PGY-3, [facility] 01/19/2019 2:53 PM

## 2019-01-22 NOTE — Telephone Encounter (Signed)
Called Amy Snyder back and was able to reach her.  She has not set up appt with [facility]. I instructed her to call today for OB appointment in the next 1-2 weeks.  She is taking labetalol 100 mg twice a day. She denies RUQ pain, vision changes, leg swelling, headaches. I have made an appointment in RN clinic for BP check for Thursday- this is the earliest she was off work to come in. At this time please check to see that she has appointment with [facility].   I explained [place] results to her and the need for follow up US in about 4 weeks.   She states understanding.  Lucila Maine, DO PGY-3, [facility] 01/22/2019 11:20 AM

## 2019-01-25 ENCOUNTER — Ambulatory Visit: Payer: Medicaid Other

## 2019-02-01 ENCOUNTER — Ambulatory Visit (HOSPITAL_COMMUNITY)
Admission: RE | Admit: 2019-02-01 | Discharge: 2019-02-01 | Disposition: A | Discharge status: Home / Self Care | Payer: Medicaid Other | Source: Ambulatory Visit | Attending: Family Medicine | Admitting: Family Medicine

## 2019-02-01 ENCOUNTER — Telehealth: Payer: Self-pay | Admitting: Family Medicine

## 2019-02-01 ENCOUNTER — Other Ambulatory Visit: Payer: Self-pay | Admitting: Family Medicine

## 2019-02-01 DIAGNOSIS — Z3491 Encounter for supervision of normal pregnancy, unspecified, first trimester: Secondary | ICD-10-CM | POA: Diagnosis present

## 2019-02-01 DIAGNOSIS — R1900 Intra-abdominal and pelvic swelling, mass and lump, unspecified site: Principal | ICD-10-CM

## 2019-02-01 DIAGNOSIS — O26899 Other specified pregnancy related conditions, unspecified trimester: Secondary | ICD-10-CM

## 2019-02-01 NOTE — Telephone Encounter (Signed)
  Called Ms. Mallery to discuss [place] results. Uterine mass still present. MRI was recommended for follow up. Will defer that to Dr. Rip Harbour who is seeing her on 2/26 to decide next steps. She is agreeable to this. She reports she is feeling well.she is taking labetalol twice a day. She missed her BP check. She rescheduled to come in for BP check Tuesday at 3:30. No headaches, RUQ pain, leg swelling, vision changes.   Lucila Maine, DO PGY-3, [facility] 02/01/2019 11:02 AM

## 2019-02-06 ENCOUNTER — Ambulatory Visit: Payer: Medicaid Other

## 2019-02-07 ENCOUNTER — Ambulatory Visit (INDEPENDENT_AMBULATORY_CARE_PROVIDER_SITE_OTHER): Payer: Medicaid Other

## 2019-02-07 VITALS — BP 136/90

## 2019-02-07 DIAGNOSIS — I10 Essential (primary) hypertension: Secondary | ICD-10-CM

## 2019-02-07 NOTE — Progress Notes (Signed)
   Patient in to nurse clinic for BP check. Takes Labetalol 100 mg BID. Last dose was last night, has not had this AM.  BP 158/92 first check; recheck 5 minutes later 136/90. Patient denies HA, chest pain, SOB, swelling, or visual disturbance.  Patient has appt with [facility] on 02/14/19.  Danley Danker, RN [facility] RN)

## 2019-02-14 ENCOUNTER — Ambulatory Visit (INDEPENDENT_AMBULATORY_CARE_PROVIDER_SITE_OTHER): Payer: Medicaid Other | Admitting: Obstetrics and Gynecology

## 2019-02-14 ENCOUNTER — Encounter: Payer: Self-pay | Admitting: Obstetrics and Gynecology

## 2019-02-14 DIAGNOSIS — Z3A13 13 weeks gestation of pregnancy: Secondary | ICD-10-CM

## 2019-02-14 DIAGNOSIS — O10911 Unspecified pre-existing hypertension complicating pregnancy, first trimester: Secondary | ICD-10-CM | POA: Diagnosis not present

## 2019-02-14 DIAGNOSIS — O10919 Unspecified pre-existing hypertension complicating pregnancy, unspecified trimester: Secondary | ICD-10-CM | POA: Insufficient documentation

## 2019-02-14 DIAGNOSIS — O09211 Supervision of pregnancy with history of pre-term labor, first trimester: Secondary | ICD-10-CM | POA: Diagnosis not present

## 2019-02-14 DIAGNOSIS — O26899 Other specified pregnancy related conditions, unspecified trimester: Secondary | ICD-10-CM | POA: Insufficient documentation

## 2019-02-14 DIAGNOSIS — Z98891 History of uterine scar from previous surgery: Secondary | ICD-10-CM

## 2019-02-14 DIAGNOSIS — Z8751 Personal history of pre-term labor: Secondary | ICD-10-CM | POA: Insufficient documentation

## 2019-02-14 DIAGNOSIS — R1900 Intra-abdominal and pelvic swelling, mass and lump, unspecified site: Secondary | ICD-10-CM | POA: Diagnosis not present

## 2019-02-14 DIAGNOSIS — O0990 Supervision of high risk pregnancy, unspecified, unspecified trimester: Secondary | ICD-10-CM | POA: Diagnosis not present

## 2019-02-14 DIAGNOSIS — O26891 Other specified pregnancy related conditions, first trimester: Secondary | ICD-10-CM

## 2019-02-14 DIAGNOSIS — Z3481 Encounter for supervision of other normal pregnancy, first trimester: Secondary | ICD-10-CM

## 2019-02-14 DIAGNOSIS — O0991 Supervision of high risk pregnancy, unspecified, first trimester: Secondary | ICD-10-CM

## 2019-02-14 HISTORY — DX: Personal history of pre-term labor: Z87.51

## 2019-02-14 HISTORY — DX: History of uterine scar from previous surgery: Z98.891

## 2019-02-14 MED ORDER — ASPIRIN EC 81 MG PO TBEC: 81.0000 mg | DELAYED_RELEASE_TABLET | Freq: Every day | ORAL | 2 refills | Status: DC

## 2019-02-14 MED ORDER — LABETALOL HCL 100 MG PO TABS: 100.0000 mg | ORAL_TABLET | Freq: Three times a day (TID) | ORAL | 5 refills | Status: DC

## 2019-02-14 NOTE — Patient Instructions (Signed)
First Trimester of Pregnancy  The first trimester of pregnancy is from week 1 until the end of week 13 (months 1 through 3). A week after a sperm fertilizes an egg, the egg will implant on the wall of the uterus. This embryo will begin to develop into a baby. Genes from you and your partner will form the baby. The female genes will determine whether the baby will be a boy or a girl. At 6-8 weeks, the eyes and face will be formed, and the heartbeat can be seen on ultrasound. At the end of 12 weeks, all the baby's organs will be formed.  Now that you are pregnant, you will want to do everything you can to have a healthy baby. Two of the most important things are to get good prenatal care and to follow your health care provider's instructions. Prenatal care is all the medical care you receive before the baby's birth. This care will help prevent, find, and treat any problems during the pregnancy and childbirth.  Body changes during your first trimester  Your body goes through many changes during pregnancy. The changes vary from woman to woman.   You may gain or lose a couple of pounds at first.   You may feel sick to your stomach (nauseous) and you may throw up (vomit). If the vomiting is uncontrollable, call your health care provider.   You may tire easily.   You may develop headaches that can be relieved by medicines. All medicines should be approved by your health care provider.   You may urinate more often. Painful urination may mean you have a bladder infection.   You may develop heartburn as a result of your pregnancy.   You may develop constipation because certain hormones are causing the muscles that push stool through your intestines to slow down.   You may develop hemorrhoids or swollen veins (varicose veins).   Your breasts may begin to grow larger and become tender. Your nipples may stick out more, and the tissue that surrounds them (areola) may become darker.   Your gums may bleed and may be  sensitive to brushing and flossing.   Dark spots or blotches (chloasma, mask of pregnancy) may develop on your face. This will likely fade after the baby is born.   Your menstrual periods will stop.   You may have a loss of appetite.   You may develop cravings for certain kinds of food.   You may have changes in your emotions from day to day, such as being excited to be pregnant or being concerned that something may go wrong with the pregnancy and baby.   You may have more vivid and strange dreams.   You may have changes in your hair. These can include thickening of your hair, rapid growth, and changes in texture. Some women also have hair loss during or after pregnancy, or hair that feels dry or thin. Your hair will most likely return to normal after your baby is born.  What to expect at prenatal visits  During a routine prenatal visit:   You will be weighed to make sure you and the baby are growing normally.   Your blood pressure will be taken.   Your abdomen will be measured to track your baby's growth.   The fetal heartbeat will be listened to between weeks 10 and 14 of your pregnancy.   Test results from any previous visits will be discussed.  Your health care provider may ask you:     How you are feeling.   If you are feeling the baby move.   If you have had any abnormal symptoms, such as leaking fluid, bleeding, severe headaches, or abdominal cramping.   If you are using any tobacco products, including cigarettes, chewing tobacco, and electronic cigarettes.   If you have any questions.  Other tests that may be performed during your first trimester include:   Blood tests to find your blood type and to check for the presence of any previous infections. The tests will also be used to check for low iron levels (anemia) and protein on red blood cells (Rh antibodies). Depending on your risk factors, or if you previously had diabetes during pregnancy, you may have tests to check for high blood sugar  that affects pregnant women (gestational diabetes).   Urine tests to check for infections, diabetes, or protein in the urine.   An ultrasound to confirm the proper growth and development of the baby.   Fetal screens for spinal cord problems (spina bifida) and Down syndrome.   HIV (human immunodeficiency virus) testing. Routine prenatal testing includes screening for HIV, unless you choose not to have this test.   You may need other tests to make sure you and the baby are doing well.  Follow these instructions at home:  Medicines   Follow your health care provider's instructions regarding medicine use. Specific medicines may be either safe or unsafe to take during pregnancy.   Take a prenatal vitamin that contains at least 600 micrograms (mcg) of folic acid.   If you develop constipation, try taking a stool softener if your health care provider approves.  Eating and drinking     Eat a balanced diet that includes fresh fruits and vegetables, whole grains, good sources of protein such as meat, eggs, or tofu, and low-fat dairy. Your health care provider will help you determine the amount of weight gain that is right for you.   Avoid raw meat and uncooked cheese. These carry germs that can cause birth defects in the baby.   Eating four or five small meals rather than three large meals a day may help relieve nausea and vomiting. If you start to feel nauseous, eating a few soda crackers can be helpful. Drinking liquids between meals, instead of during meals, also seems to help ease nausea and vomiting.   Limit foods that are high in fat and processed sugars, such as fried and sweet foods.   To prevent constipation:  ? Eat foods that are high in fiber, such as fresh fruits and vegetables, whole grains, and beans.  ? Drink enough fluid to keep your urine clear or pale yellow.  Activity   Exercise only as directed by your health care provider. Most women can continue their usual exercise routine during  pregnancy. Try to exercise for 30 minutes at least 5 days a week. Exercising will help you:  ? Control your weight.  ? Stay in shape.  ? Be prepared for labor and delivery.   Experiencing pain or cramping in the lower abdomen or lower back is a good sign that you should stop exercising. Check with your health care provider before continuing with normal exercises.   Try to avoid standing for long periods of time. Move your legs often if you must stand in one place for a long time.   Avoid heavy lifting.   Wear low-heeled shoes and practice good posture.   You may continue to have sex unless your health care   provider tells you not to.  Relieving pain and discomfort   Wear a good support bra to relieve breast tenderness.   Take warm sitz baths to soothe any pain or discomfort caused by hemorrhoids. Use hemorrhoid cream if your health care provider approves.   Rest with your legs elevated if you have leg cramps or low back pain.   If you develop varicose veins in your legs, wear support hose. Elevate your feet for 15 minutes, 3-4 times a day. Limit salt in your diet.  Prenatal care   Schedule your prenatal visits by the twelfth week of pregnancy. They are usually scheduled monthly at first, then more often in the last 2 months before delivery.   Write down your questions. Take them to your prenatal visits.   Keep all your prenatal visits as told by your health care provider. This is important.  Safety   Wear your seat belt at all times when driving.   Make a list of emergency phone numbers, including numbers for family, friends, the hospital, and police and fire departments.  General instructions   Ask your health care provider for a referral to a local prenatal education class. Begin classes no later than the beginning of month 6 of your pregnancy.   Ask for help if you have counseling or nutritional needs during pregnancy. Your health care provider can offer advice or refer you to specialists for help  with various needs.   Do not use hot tubs, steam rooms, or saunas.   Do not douche or use tampons or scented sanitary pads.   Do not cross your legs for long periods of time.   Avoid cat litter boxes and soil used by cats. These carry germs that can cause birth defects in the baby and possibly loss of the fetus by miscarriage or stillbirth.   Avoid all smoking, herbs, alcohol, and medicines not prescribed by your health care provider. Chemicals in these products affect the formation and growth of the baby.   Do not use any products that contain nicotine or tobacco, such as cigarettes and e-cigarettes. If you need help quitting, ask your health care provider. You may receive counseling support and other resources to help you quit.   Schedule a dentist appointment. At home, brush your teeth with a soft toothbrush and be gentle when you floss.  Contact a health care provider if:   You have dizziness.   You have mild pelvic cramps, pelvic pressure, or nagging pain in the abdominal area.   You have persistent nausea, vomiting, or diarrhea.   You have a bad smelling vaginal discharge.   You have pain when you urinate.   You notice increased swelling in your face, hands, legs, or ankles.   You are exposed to fifth disease or chickenpox.   You are exposed to German measles (rubella) and have never had it.  Get help right away if:   You have a fever.   You are leaking fluid from your vagina.   You have spotting or bleeding from your vagina.   You have severe abdominal cramping or pain.   You have rapid weight gain or loss.   You vomit blood or material that looks like coffee grounds.   You develop a severe headache.   You have shortness of breath.   You have any kind of trauma, such as from a fall or a car accident.  Summary   The first trimester of pregnancy is from week 1 until   the end of week 13 (months 1 through 3).   Your body goes through many changes during pregnancy. The changes vary from  woman to woman.   You will have routine prenatal visits. During those visits, your health care provider will examine you, discuss any test results you may have, and talk with you about how you are feeling.  This information is not intended to replace advice given to you by your health care provider. Make sure you discuss any questions you have with your health care provider.  Document Released: 11/30/2001 Document Revised: 11/17/2016 Document Reviewed: 11/17/2016  Elsevier Interactive Patient Education  2019 [employer]

## 2019-02-14 NOTE — Progress Notes (Signed)
Pt presents for NOB. This is not a planned pregnancy but FOB is involved.  No complaints today per pt.

## 2019-02-15 LAB — PROTEIN / CREATININE RATIO, URINE
Creatinine, Urine: 438.5 mg/dL
Protein, Ur: 33.7 mg/dL
Protein/Creat Ratio: 77 mg/g creat (ref 0–200)

## 2019-02-18 LAB — URINE CULTURE, OB REFLEX

## 2019-02-18 LAB — CULTURE, OB URINE

## 2019-02-22 ENCOUNTER — Encounter: Payer: Self-pay | Admitting: Obstetrics and Gynecology

## 2019-02-24 LAB — COMPREHENSIVE METABOLIC PANEL
ALT: 9 IU/L (ref 0–32)
AST: 16 IU/L (ref 0–40)
Albumin/Globulin Ratio: 1.3 (ref 1.2–2.2)
Albumin: 3.8 g/dL — ABNORMAL LOW (ref 3.9–5.0)
Alkaline Phosphatase: 53 IU/L (ref 39–117)
BUN/Creatinine Ratio: 12 (ref 9–23)
BUN: 6 mg/dL (ref 6–20)
Bilirubin Total: <0.2 mg/dL (ref 0.0–1.2)
CO2: 19 mmol/L — ABNORMAL LOW (ref 20–29)
Calcium: 9.4 mg/dL (ref 8.7–10.2)
Chloride: 104 mmol/L (ref 96–106)
Creatinine, Ser: 0.5 mg/dL — ABNORMAL LOW (ref 0.57–1.00)
GFR calc Af Amer: 152 mL/min/{1.73_m2} (ref >59)
GFR calc non Af Amer: 132 mL/min/{1.73_m2} (ref >59)
Globulin, Total: 2.9 g/dL (ref 1.5–4.5)
Glucose: 100 mg/dL — ABNORMAL HIGH (ref 65–99)
Potassium: 4 mmol/L (ref 3.5–5.2)
Sodium: 137 mmol/L (ref 134–144)
Total Protein: 6.7 g/dL (ref 6.0–8.5)

## 2019-02-24 LAB — SMN1 COPY NUMBER ANALYSIS (SMA CARRIER SCREENING)

## 2019-02-24 LAB — HEMOGLOBINOPATHY EVALUATION
HGB C: 0 %
HGB S: 0 %
HGB VARIANT: 0 %
Hemoglobin A2 Quantitation: 1.9 % (ref 1.8–3.2)
Hemoglobin F Quantitation: 0 % (ref 0.0–2.0)
Hgb A: 98.1 % (ref 96.4–98.8)

## 2019-02-24 LAB — CYSTIC FIBROSIS MUTATION 97: Interpretation: NOT DETECTED

## 2019-02-24 LAB — TSH: TSH: 0.944 u[IU]/mL (ref 0.450–4.500)

## 2019-03-02 NOTE — Assessment & Plan Note (Signed)
Discussed with Dr Earle Gell, radiologist. Review of current U/S and MRI confirms mass as previous endometrioma. No further w/u needed presently

## 2019-03-02 NOTE — Progress Notes (Signed)
Subjective:  Amy Snyder is a 28 y.o. G2P0101 at 3w0dbeing seen today for her first OB visit. EDD confirmed by U/S. H/O PTD, presented in active with footling breech. Had classical c section. Pt also with known ant abd wall endometrioma, confirmed by MRI and Bx in 2017.  U/S during this pregnancy confirms still present.   She is currently monitored for the following issues for this high-risk pregnancy and has Lipoma of abdominal wall; Essential hypertension, benign; Abnormal Pap smear of cervix; Endometriosis; Supervision of high risk pregnancy, antepartum; History of preterm delivery; Chronic hypertension affecting pregnancy; Pelvic mass during pregnancy; and History of cesarean section, classical on their problem list.  Patient reports no complaints.  Contractions: Not present. Vag. Bleeding: None.  Movement: Absent. Denies leaking of fluid.   The following portions of the patient's history were reviewed and updated as appropriate: allergies, current medications, past family history, past medical history, past social history, past surgical history and problem list. Problem list updated.  Objective:   Vitals:   02/14/19 1106  BP: (!) 145/92  Pulse: 83  Weight: 179 lb 1.6 oz (81.2 kg)    Fetal Status: Fetal Heart Rate (bpm): 172   Movement: Absent     General:  Alert, oriented and cooperative. Patient is in no acute distress.  Skin: Skin is warm and dry. No rash noted.   Cardiovascular: Normal heart rate noted  Respiratory: Normal respiratory effort, no problems with respiration noted  Abdomen: Soft, gravid, appropriate for gestational age. Pain/Pressure: Absent   Slightly mass affect at incision site noted  Pelvic:  Cervical exam performed        Extremities: Normal range of motion.  Edema: None  Mental Status: Normal mood and affect. Normal behavior. Normal judgment and thought content.   Urinalysis:      Assessment and Plan:  Pregnancy: G2P0101 at 180w0d1. Supervision of high  risk pregnancy, antepartum Prenatal care, labs and genetic testing reviewed with pt. - Culture, OB Urine - Genetic Screening - Hemoglobinopathy evaluation - Cystic Fibrosis Mutation 97 - SMN1 COPY NUMBER ANALYSIS (SMA Carrier Screen) - CHL AMB BABYSCRIPTS OPT IN - Urine Culture, OB Reflex  2. History of preterm delivery No clear cause of PTL identified. Discussed 17 OHP with pt. Paper work started today  3. Chronic hypertension affecting pregnancy BP stable Start BASA CHTN and pregnancy reviewed with pt, including antenatal testing - TSH - Protein / creatinine ratio, urine - Comp Met (CMET) - aspirin EC 81 MG tablet; Take 1 tablet (81 mg total) by mouth daily. Take after 12 weeks for prevention of preeclampsia later in pregnancy  Dispense: 300 tablet; Refill: 2  4. Pelvic mass during pregnancy Appears to be consistent with previously identified endometrioma. See problems for details  5. History of cesarean section, classical Will need repeat c section at 37-38 weeks  Preterm labor symptoms and general obstetric precautions including but not limited to vaginal bleeding, contractions, leaking of fluid and fetal movement were reviewed in detail with the patient. Please refer to After Visit Summary for other counseling recommendations.  Return in about 4 weeks (around 03/14/2019) for OB visit.   ErChancy MilroyMD

## 2019-03-14 ENCOUNTER — Encounter: Payer: Self-pay | Admitting: Certified Nurse Midwife

## 2019-03-14 ENCOUNTER — Ambulatory Visit (INDEPENDENT_AMBULATORY_CARE_PROVIDER_SITE_OTHER): Payer: Medicaid Other | Admitting: Certified Nurse Midwife

## 2019-03-14 ENCOUNTER — Other Ambulatory Visit: Payer: Self-pay

## 2019-03-14 VITALS — BP 131/88 | HR 82 | Wt 178.9 lb

## 2019-03-14 DIAGNOSIS — Z3A14 14 weeks gestation of pregnancy: Secondary | ICD-10-CM | POA: Diagnosis not present

## 2019-03-14 DIAGNOSIS — O10919 Unspecified pre-existing hypertension complicating pregnancy, unspecified trimester: Secondary | ICD-10-CM

## 2019-03-14 DIAGNOSIS — O09212 Supervision of pregnancy with history of pre-term labor, second trimester: Secondary | ICD-10-CM | POA: Diagnosis not present

## 2019-03-14 DIAGNOSIS — O10912 Unspecified pre-existing hypertension complicating pregnancy, second trimester: Secondary | ICD-10-CM | POA: Diagnosis not present

## 2019-03-14 DIAGNOSIS — Z98891 History of uterine scar from previous surgery: Secondary | ICD-10-CM

## 2019-03-14 DIAGNOSIS — O0990 Supervision of high risk pregnancy, unspecified, unspecified trimester: Secondary | ICD-10-CM

## 2019-03-14 DIAGNOSIS — Z8751 Personal history of pre-term labor: Secondary | ICD-10-CM

## 2019-03-14 NOTE — Patient Instructions (Signed)

## 2019-03-14 NOTE — Progress Notes (Signed)
   PRENATAL VISIT NOTE  Subjective:  Amy Snyder is a 28 y.o. G2P0101 at [redacted]w[redacted]d being seen today for ongoing prenatal care.  She is currently monitored for the following issues for this high-risk pregnancy and has Lipoma of abdominal wall; Essential hypertension, benign; Abnormal Pap smear of cervix; Endometriosis; Supervision of high risk pregnancy, antepartum; History of preterm delivery; Chronic hypertension affecting pregnancy; Pelvic mass during pregnancy; and History of cesarean section, classical on their problem list.  Patient reports no complaints.  Contractions: Not present. Vag. Bleeding: None.  Movement: Present. Denies leaking of fluid.   The following portions of the patient's history were reviewed and updated as appropriate: allergies, current medications, past family history, past medical history, past social history, past surgical history and problem list.   Objective:   Vitals:   03/14/19 1025  BP: 131/88  Pulse: 82  Weight: 178 lb 14.4 oz (81.1 kg)    Fetal Status: Fetal Heart Rate (bpm): 166   Movement: Present     General:  Alert, oriented and cooperative. Patient is in no acute distress.  Skin: Skin is warm and dry. No rash noted.   Cardiovascular: Normal heart rate noted  Respiratory: Normal respiratory effort, no problems with respiration noted  Abdomen: Soft, gravid, appropriate for gestational age.  Pain/Pressure: Absent     Pelvic: Cervical exam deferred        Extremities: Normal range of motion.  Edema: None  Mental Status: Normal mood and affect. Normal behavior. Normal judgment and thought content.   Assessment and Plan:  Pregnancy: G2P0101 at [redacted]w[redacted]d 1. Supervision of high risk pregnancy, antepartum - Patient doing well, no complaints  - Discussed results of initial prenatal labs with patient  - Educated and discussed recommendation on COVID19 which includes handwashing and social distancing  - [place] MFM OB DETAIL +14 [place]; Future  2. History of preterm  delivery - PTL and PTD, discussed 17P with patient  - Plan to start weekly Makena injections at 16 weeks   3. History of cesarean section, classical - Plan for repeat C/S around 37 weeks due to classical C/S   4. Chronic hypertension affecting pregnancy - Continue BASA  - BP stable at this current time   Preterm labor symptoms and general obstetric precautions including but not limited to vaginal bleeding, contractions, leaking of fluid and fetal movement were reviewed in detail with the patient. Please refer to After Visit Summary for other counseling recommendations.   Return in about 4 weeks (around 04/11/2019) for [facility]- MD ONLY.  Future Appointments  Date Time Provider [place]  03/29/2019 10:30 AM [place] None  04/12/2019 10:00 AM Chancy Milroy, MD [place] None    Lajean Manes, CNM

## 2019-03-20 ENCOUNTER — Encounter (HOSPITAL_COMMUNITY): Payer: Self-pay | Admitting: Emergency Medicine

## 2019-03-20 ENCOUNTER — Other Ambulatory Visit: Payer: Self-pay

## 2019-03-20 ENCOUNTER — Emergency Department (HOSPITAL_COMMUNITY)
Admission: EM | Admit: 2019-03-20 | Discharge: 2019-03-20 | Disposition: A | Discharge status: Home / Self Care | Payer: Medicaid Other | Attending: Emergency Medicine | Admitting: Emergency Medicine

## 2019-03-20 DIAGNOSIS — R05 Cough: Secondary | ICD-10-CM | POA: Diagnosis present

## 2019-03-20 DIAGNOSIS — K219 Gastro-esophageal reflux disease without esophagitis: Secondary | ICD-10-CM | POA: Insufficient documentation

## 2019-03-20 DIAGNOSIS — Z3A15 15 weeks gestation of pregnancy: Secondary | ICD-10-CM | POA: Diagnosis not present

## 2019-03-20 DIAGNOSIS — Z79899 Other long term (current) drug therapy: Secondary | ICD-10-CM | POA: Insufficient documentation

## 2019-03-20 DIAGNOSIS — Z7982 Long term (current) use of aspirin: Secondary | ICD-10-CM | POA: Insufficient documentation

## 2019-03-20 DIAGNOSIS — Z87891 Personal history of nicotine dependence: Secondary | ICD-10-CM | POA: Insufficient documentation

## 2019-03-20 DIAGNOSIS — I10 Essential (primary) hypertension: Secondary | ICD-10-CM | POA: Insufficient documentation

## 2019-03-20 DIAGNOSIS — O99612 Diseases of the digestive system complicating pregnancy, second trimester: Secondary | ICD-10-CM | POA: Diagnosis not present

## 2019-03-20 MED ORDER — CALCIUM CARBONATE ANTACID 500 MG PO CHEW
1.0000 | CHEWABLE_TABLET | Freq: Once | ORAL | Status: AC
  Administered 2019-03-20: 200 mg via ORAL
  Filled 2019-03-20: qty 1

## 2019-03-20 MED ORDER — ALUM & MAG HYDROXIDE-SIMETH 200-200-20 MG/5ML PO SUSP
30.0000 mL | Freq: Once | ORAL | Status: AC
  Administered 2019-03-20: 30 mL via ORAL
  Filled 2019-03-20: qty 30

## 2019-03-20 NOTE — ED Provider Notes (Signed)
[facility] EMERGENCY DEPARTMENT Provider Note   CSN: [id number] Arrival date & time: 03/20/19  9528    History   Chief Complaint Chief Complaint  Patient presents with  . Emesis  . Cough    HPI Amy Snyder is a G31P0101 28 y.o. female with a history of hypertension, endometriosis is currently [redacted] weeks pregnant who presents to the emergency department with a chief complaint of vomiting and chest pain.  The patient endorses "burning" chest pain that began earlier tonight.  She reports that the pain is in the middle of her chest and radiates up into her throat.  Pain has been constant and became worse when she down to go to bed.  She reports that she had 2 episodes earlier tonight where she vomited.  She reports associated burping and belching.  She reports that 1 of the episodes had some red discoloration to it and she does not recall eating or drinking any red substances earlier tonight.  She is concerned that she may have heartburn, but has no history of similar note is unsure what it feels like.  She denies fever, chills, shortness of breath, abdominal pain, nausea, constipation, diarrhea, urinary symptoms, or vaginal bleeding.  She reports that she ate chicken and macaroni and cheese for dinner.  She reports that she has had a nonproductive cough for the last few days, but is getting over a cold.     The history is provided by the patient. No language interpreter was used.  Cough  Associated symptoms: chest pain and sore throat   Associated symptoms: no chills, no fever, no headaches, no rash, no shortness of breath and no wheezing     Past Medical History:  Diagnosis Date  . BV (bacterial vaginosis)   . Endometriosis    per biopsy done 11/2016  . Hypertension     Patient Active Problem List   Diagnosis Date Noted  . Supervision of high risk pregnancy, antepartum 02/14/2019  . History of preterm delivery 02/14/2019  . Chronic hypertension affecting  pregnancy 02/14/2019  . Pelvic mass during pregnancy 02/14/2019  . History of cesarean section, classical 02/14/2019  . Endometriosis 12/06/2017  . Abnormal Pap smear of cervix 09/26/2016  . Essential hypertension, benign 07/03/2014  . 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 CESAREAN SECTION  10/21/2009   Classical c/s      OB History    Gravida  2   Para  1   Term      Preterm  1   AB      Living  1     SAB      TAB      Ectopic      Multiple      Live Births  1            Home Medications    Prior to Admission medications   Medication Sig Start Date End Date Taking? Authorizing Provider  acetaminophen (TYLENOL) 500 MG tablet Take 1,000 mg by mouth every 6 (six) hours as needed for mild pain.    [provider]  aspirin EC 81 MG tablet Take 1 tablet (81 mg total) by mouth daily. Take after 12 weeks for prevention of preeclampsia later in pregnancy 02/14/19   Chancy Milroy, MD  labetalol (NORMODYNE) 100 MG tablet Take 1 tablet (100 mg total) by mouth 3 (three) times daily. 02/14/19   Chancy Milroy,  MD    Family History Family History  Problem Relation Age of Onset  . Hypertension Mother   . Diabetes Other   . Diabetes Maternal Grandfather     Social History Social History   Tobacco Use  . Smoking status: Former Smoker    Packs/day: 0.20    Years: 3.00    Pack years: 0.60    Types: Cigarettes  . Smokeless tobacco: Never Used  . Tobacco comment: smokes socially  Substance Use Topics  . Alcohol use: No    Alcohol/week: 0.0 standard drinks    Comment: socially  . Drug use: No     Allergies   Flagyl [metronidazole]   Review of Systems Review of Systems  Constitutional: Negative for activity change, chills and fever.  HENT: Positive for sore throat. Negative for congestion.   Eyes: Negative for visual disturbance.  Respiratory: Positive for cough. Negative for shortness  of breath and wheezing.   Cardiovascular: Positive for chest pain. Negative for palpitations and leg swelling.  Gastrointestinal: Positive for vomiting. Negative for abdominal pain, blood in stool, constipation, diarrhea and nausea.  Genitourinary: Negative for dysuria.  Musculoskeletal: Negative for back pain.  Skin: Negative for rash.  Allergic/Immunologic: Negative for immunocompromised state.  Neurological: Negative for weakness and headaches.  Psychiatric/Behavioral: Negative for confusion.     Physical Exam Updated Vital Signs BP (!) 130/95   Pulse 72   Temp 98.2 F (36.8 C) (Oral)   Resp 16   Ht 5\' 7"  (1.702 m)   Wt 81.2 kg   LMP 12/01/2018   SpO2 100%   BMI 28.04 kg/m   Physical Exam Vitals signs and nursing note reviewed.  Constitutional:      General: She is not in acute distress. HENT:     Head: Normocephalic.  Eyes:     Conjunctiva/sclera: Conjunctivae normal.  Neck:     Musculoskeletal: Normal range of motion and neck supple.  Cardiovascular:     Rate and Rhythm: Normal rate and regular rhythm.     Heart sounds: No murmur. No friction rub. No gallop.   Pulmonary:     Effort: Pulmonary effort is normal. No respiratory distress.     Breath sounds: No stridor. No wheezing, rhonchi or rales.  Chest:     Chest wall: No tenderness.  Abdominal:     General: There is no distension.     Palpations: Abdomen is soft. There is no mass.     Tenderness: There is no abdominal tenderness. There is no right CVA tenderness, left CVA tenderness, guarding or rebound.     Hernia: No hernia is present.  Skin:    General: Skin is warm.     Capillary Refill: Capillary refill takes less than 2 seconds.     Findings: No rash.  Neurological:     Mental Status: She is alert.  Psychiatric:        Behavior: Behavior normal.      ED Treatments / Results  Labs (all labs ordered are listed, but only abnormal results are displayed) Labs Reviewed - No data to display  EKG  None  Radiology No results found.  Procedures Procedures (including critical care time)  Medications Ordered in ED Medications  alum & mag hydroxide-simeth (MAALOX/MYLANTA) 200-200-20 MG/5ML suspension 30 mL (30 mLs Oral Given 03/20/19 0405)  calcium carbonate (TUMS - dosed in mg elemental calcium) chewable tablet 200 mg of elemental calcium (200 mg of elemental calcium Oral Given 03/20/19 0532)  Initial Impression / Assessment and Plan / ED Course  I have reviewed the triage vital signs and the nursing notes.  Pertinent labs & imaging results that were available during my care of the patient were reviewed by me and considered in my medical decision making (see chart for details).         Amy Snyder is a G36P0101 female with a history of hypertension and endometriosis presenting with burning chest pain in the middle of her chest that radiates up into her throat with burping, belching, and 2 episodes of vomiting, 1 of which was red-tinged.  She has no other associated symptoms including shortness of breath or constitutional symptoms.  Differential diagnosis includes GERD, ACS, dissection, esophageal rupture, or esophageal dysmotility. No treatment prior to arrival.  Physical exam is reassuring.  Given second trimester for pregnancy, I suspect the patient has GERD.  She was trialed with a dose of Maalox in the ER with significant improvement in her symptoms.  She was successfully fluid challenge prior to arrival.  The patient was counseled on food choices for GERD and medications that were appropriate to treat her symptoms with since she is currently pregnant as well as behavior modifications.  Advised following up with her OB/GYN if symptoms persist.  She was also given return precautions to ER for new or worsening symptoms.  She is hemodynamically stable and in no acute distress.  She is safe for discharge home with outpatient follow-up at this time.  Final Clinical Impressions(s) / ED  Diagnoses   Final diagnoses:  Gastroesophageal reflux disease, esophagitis presence not specified    ED Discharge Orders    None       , Laymond Purser, PA-C 03/20/19 [place], Delice Bison, DO 03/20/19 [id number]

## 2019-03-20 NOTE — ED Triage Notes (Signed)
Pt presents w/ c/o emesis x2 today around 0300. Pt says she's been throwing d/t morning sickness from pregnancy but this time it was bloody. Pt also complaining of burning pain in chest. Pt has had cold symptoms since Tuesday. No fever, no contact w/ other people sick, no travel.

## 2019-03-20 NOTE — Discharge Instructions (Signed)
Thank you for allowing me to care for you today in the Emergency Department.   Tums and Maalox are available over-the-counter and are safe to use for heartburn during pregnancy. Use as directed on the label.  Try to avoid eating at least 3 hours before you go to bed.  Try to prop your pillows so that your sleeping at a 30 degree angle as this may help to improve your symptoms.  I have also attached instructions on food choices that may help to prevent acid reflux.  Return to the emergency department if you develop persistent vomiting, fever, if you start coughing up or vomiting of large amounts of blood or clots, vaginal bleeding, severe uncontrollable abdominal pain, or other new, concerning symptoms.

## 2019-03-20 NOTE — ED Notes (Signed)
Patient verbalizes understanding of discharge instructions. Opportunity for questioning and answers were provided. Armband removed by staff, pt discharged from ED ambulatory.   

## 2019-03-23 ENCOUNTER — Telehealth: Payer: Self-pay

## 2019-03-23 NOTE — Telephone Encounter (Signed)
Patient called and states she is feeling "pressure" in her lower abdomen. Pt denies cramping/bleeding/LOF. I advised patient that this is normal and most likely round ligament pain associated with pregnancy. Advised pt should she start experiencing any cramping/contractions, bleeding, or LOF to go to the hospital to be evaluated, pt verbalizes understanding.

## 2019-03-24 ENCOUNTER — Inpatient Hospital Stay (HOSPITAL_COMMUNITY): Payer: Medicaid Other

## 2019-03-24 ENCOUNTER — Inpatient Hospital Stay (HOSPITAL_COMMUNITY)
Admission: AD | Admit: 2019-03-24 | Discharge: 2019-03-26 | DRG: 779 | Disposition: A | Discharge status: Home / Self Care | Payer: Medicaid Other | Attending: Obstetrics and Gynecology | Admitting: Obstetrics and Gynecology

## 2019-03-24 ENCOUNTER — Encounter (HOSPITAL_COMMUNITY): Payer: Self-pay | Admitting: *Deleted

## 2019-03-24 ENCOUNTER — Other Ambulatory Visit: Payer: Self-pay

## 2019-03-24 DIAGNOSIS — Z3A16 16 weeks gestation of pregnancy: Secondary | ICD-10-CM

## 2019-03-24 DIAGNOSIS — R109 Unspecified abdominal pain: Secondary | ICD-10-CM | POA: Diagnosis not present

## 2019-03-24 DIAGNOSIS — Z3492 Encounter for supervision of normal pregnancy, unspecified, second trimester: Secondary | ICD-10-CM

## 2019-03-24 DIAGNOSIS — O3432 Maternal care for cervical incompetence, second trimester: Secondary | ICD-10-CM

## 2019-03-24 DIAGNOSIS — O021 Missed abortion: Secondary | ICD-10-CM | POA: Diagnosis not present

## 2019-03-24 DIAGNOSIS — O200 Threatened abortion: Secondary | ICD-10-CM

## 2019-03-24 DIAGNOSIS — O039 Complete or unspecified spontaneous abortion without complication: Secondary | ICD-10-CM | POA: Diagnosis not present

## 2019-03-24 DIAGNOSIS — O26899 Other specified pregnancy related conditions, unspecified trimester: Secondary | ICD-10-CM

## 2019-03-24 DIAGNOSIS — Z87891 Personal history of nicotine dependence: Secondary | ICD-10-CM | POA: Diagnosis not present

## 2019-03-24 DIAGNOSIS — I10 Essential (primary) hypertension: Secondary | ICD-10-CM | POA: Diagnosis present

## 2019-03-24 DIAGNOSIS — O26892 Other specified pregnancy related conditions, second trimester: Secondary | ICD-10-CM

## 2019-03-24 LAB — CBC
HCT: 34.3 % — ABNORMAL LOW (ref 36.0–46.0)
Hemoglobin: 11.4 g/dL — ABNORMAL LOW (ref 12.0–15.0)
MCH: 27.5 pg (ref 26.0–34.0)
MCHC: 33.2 g/dL (ref 30.0–36.0)
MCV: 82.7 fL (ref 80.0–100.0)
Platelets: 213 10*3/uL (ref 150–400)
RBC: 4.15 MIL/uL (ref 3.87–5.11)
RDW: 13.8 % (ref 11.5–15.5)
WBC: 7.9 10*3/uL (ref 4.0–10.5)
nRBC: 0 % (ref 0.0–0.2)

## 2019-03-24 LAB — WET PREP, GENITAL
Clue Cells Wet Prep HPF POC: NONE SEEN
Sperm: NONE SEEN
Trich, Wet Prep: NONE SEEN

## 2019-03-24 LAB — COMPREHENSIVE METABOLIC PANEL WITH GFR
ALT: 21 U/L (ref 0–44)
AST: 26 U/L (ref 15–41)
Albumin: 3.1 g/dL — ABNORMAL LOW (ref 3.5–5.0)
Alkaline Phosphatase: 48 U/L (ref 38–126)
Anion gap: 8 (ref 5–15)
BUN: <5 mg/dL — ABNORMAL LOW (ref 6–20)
CO2: 20 mmol/L — ABNORMAL LOW (ref 22–32)
Calcium: 9 mg/dL (ref 8.9–10.3)
Chloride: 107 mmol/L (ref 98–111)
Creatinine, Ser: 0.58 mg/dL (ref 0.44–1.00)
GFR calc Af Amer: >60 mL/min (ref >60)
GFR calc non Af Amer: >60 mL/min (ref >60)
Glucose, Bld: 90 mg/dL (ref 70–99)
Potassium: 3.7 mmol/L (ref 3.5–5.1)
Sodium: 135 mmol/L (ref 135–145)
Total Bilirubin: 0.2 mg/dL — ABNORMAL LOW (ref 0.3–1.2)
Total Protein: 6.4 g/dL — ABNORMAL LOW (ref 6.5–8.1)

## 2019-03-24 LAB — TYPE AND SCREEN
ABO/RH(D): A POS
Antibody Screen: NEGATIVE

## 2019-03-24 MED ORDER — ZOLPIDEM TARTRATE 5 MG PO TABS: 5.0000 mg | ORAL_TABLET | Freq: Every evening | ORAL | Status: DC | PRN

## 2019-03-24 MED ORDER — ACETAMINOPHEN 325 MG PO TABS
650.0000 mg | ORAL_TABLET | ORAL | Status: DC | PRN
  Administered 2019-03-25 – 2019-03-26 (×4): 650 mg via ORAL
  Filled 2019-03-24 (×4): qty 2

## 2019-03-24 MED ORDER — DOCUSATE SODIUM 100 MG PO CAPS
100.0000 mg | ORAL_CAPSULE | Freq: Every day | ORAL | Status: DC
  Administered 2019-03-24 – 2019-03-26 (×2): 100 mg via ORAL
  Filled 2019-03-24 (×2): qty 1

## 2019-03-24 MED ORDER — LACTATED RINGERS IV SOLN
INTRAVENOUS | Status: DC
  Administered 2019-03-24 – 2019-03-26 (×5): via INTRAVENOUS

## 2019-03-24 MED ORDER — FENTANYL CITRATE (PF) 100 MCG/2ML IJ SOLN
50.0000 ug | INTRAMUSCULAR | Status: DC | PRN
  Administered 2019-03-24: 23:00:00 100 ug via INTRAVENOUS
  Administered 2019-03-24: 50 ug via INTRAVENOUS
  Administered 2019-03-25 – 2019-03-26 (×9): 100 ug via INTRAVENOUS
  Filled 2019-03-24 (×12): qty 2

## 2019-03-24 MED ORDER — LABETALOL HCL 5 MG/ML IV SOLN
20.0000 mg | INTRAVENOUS | Status: DC | PRN
  Administered 2019-03-24: 20 mg via INTRAVENOUS
  Filled 2019-03-24: qty 4

## 2019-03-24 MED ORDER — LABETALOL HCL 5 MG/ML IV SOLN: 80.0000 mg | INTRAVENOUS | Status: DC | PRN

## 2019-03-24 MED ORDER — LACTATED RINGERS IV BOLUS
1000.0000 mL | Freq: Once | INTRAVENOUS | Status: AC
  Administered 2019-03-24: 19:00:00 1000 mL via INTRAVENOUS

## 2019-03-24 MED ORDER — CALCIUM CARBONATE ANTACID 500 MG PO CHEW: 2.0000 | CHEWABLE_TABLET | ORAL | Status: DC | PRN

## 2019-03-24 MED ORDER — LABETALOL HCL 100 MG PO TABS
300.0000 mg | ORAL_TABLET | Freq: Once | ORAL | Status: AC
  Administered 2019-03-24: 19:00:00 300 mg via ORAL
  Filled 2019-03-24: qty 3

## 2019-03-24 MED ORDER — HYDRALAZINE HCL 20 MG/ML IJ SOLN: 10.0000 mg | INTRAMUSCULAR | Status: DC | PRN

## 2019-03-24 MED ORDER — LABETALOL HCL 5 MG/ML IV SOLN
40.0000 mg | INTRAVENOUS | Status: DC | PRN
  Administered 2019-03-24: 40 mg via INTRAVENOUS
  Filled 2019-03-24: qty 8

## 2019-03-24 MED ORDER — PRENATAL MULTIVITAMIN CH
1.0000 | ORAL_TABLET | Freq: Every day | ORAL | Status: DC
  Administered 2019-03-26: 1 via ORAL
  Filled 2019-03-24: qty 1

## 2019-03-24 NOTE — MAU Note (Signed)
Amy Snyder is a 28 y.o. at [redacted]w[redacted]d here in MAU reporting: cramping that started today. No vaginal bleeding, reports some thick vaginal discharge, no odor.  Onset of complaint: today  Pain score: 10/10  Vitals:   03/24/19 1731  BP: (!) 156/101  Pulse: 77  Resp: 18  Temp: 98.6 F (37 C)  SpO2: 99%      Lab orders placed from triage: UA

## 2019-03-24 NOTE — H&P (Addendum)
History     CSN: [id number]  Arrival date and time: 03/24/19 1709   First Provider Initiated Contact with Patient 03/24/19 1754      Chief Complaint  Patient presents with  . Abdominal Pain  . Vaginal Discharge   [name] is a 28 y.o. G2P0101 at [redacted]w[redacted]d who presents to MAU for cramping which began around 1300 today. Pt called the office yesterday to report pelvic pressure that was happening on-and-off all day and was told it was round ligament pain and was not advised to come to MAU. Pt denies pelvic pressure today. Pt feels like baby is "sitting on her bladder." Pt denies feeling the need to push. Pt denies back pain today, but yesterday reports bilateral flank pain.  Pt rates cramping as 10/10, reports cramping every 30-38min earlier today, and is now every 26min. Pt reports they are so painful they are causing her to tear up.  Pt reports vaginal discharge that is mucus-like without reddish or brownish-discharge. Pt denies VB.  Pt denies HA, blurry vision/seeing spots, N/V, epigastric pain, swelling in face and hands, sudden weight gain. Pt denies chest pain and SOB.  Pt denies taking any medication at home for the pain. Pt reports she is supposed to take labetalol 300mg  TID for pain, but has not taken it since last night.  Pt denies VB, LOF, ctx, vaginal odor/itching. Pt denies constipation, diarrhea, or urinary problems. Pt denies fever, chills, fatigue, sweating or changes in appetite. Pt denies dizziness, light-headedness, weakness.  Problems this pregnancy include: cHTN. Blood Type? A positive Allergies? Flagyl Current medications/supplements? Labetalol 300mg  TID, PNVs, bASA Prenatal care provider? Amy Snyder, next appt 03/29/2019 for Amy Snyder    OB History    Gravida  2   Para  1   Term      Preterm  1   AB      Living  1     SAB      TAB      Ectopic      Multiple      Live Births  1           Past Medical History:  Diagnosis Date  . BV  (bacterial vaginosis)   . Endometriosis    per biopsy done 11/2016  . Hypertension     Past Surgical History:  Procedure Laterality Date  . CESAREAN SECTION  10/21/2009   Classical c/s     Family History  Problem Relation Age of Onset  . Hypertension Mother   . Diabetes Other   . Diabetes Maternal Grandfather     Social History   Tobacco Use  . Smoking status: Former Smoker    Packs/day: 0.20    Years: 3.00    Pack years: 0.60    Types: Cigarettes  . Smokeless tobacco: Never Used  . Tobacco comment: smokes socially  Substance Use Topics  . Alcohol use: No    Alcohol/week: 0.0 standard drinks    Comment: socially  . Drug use: No    Allergies:  Allergies  Allergen Reactions  . Flagyl [Metronidazole] Itching, Swelling and Rash    Medications Prior to Admission  Medication Sig Dispense Refill Last Dose  . acetaminophen (TYLENOL) 500 MG tablet Take 1,000 mg by mouth every 6 (six) hours as needed for mild pain.   Taking  . aspirin EC 81 MG tablet Take 1 tablet (81 mg total) by mouth daily. Take after 12 weeks for prevention of preeclampsia later in pregnancy 300 tablet  2 Taking  . labetalol (NORMODYNE) 100 MG tablet Take 1 tablet (100 mg total) by mouth 3 (three) times daily. 90 tablet 5 Taking    Review of Systems  Constitutional: Negative for chills, diaphoresis, fatigue and fever.  Respiratory: Negative for shortness of breath.   Cardiovascular: Negative for chest pain.  Gastrointestinal: Positive for abdominal pain. Negative for constipation, diarrhea, nausea and vomiting.  Genitourinary: Positive for flank pain, pelvic pain (cramping q71min) and vaginal discharge. Negative for dysuria, frequency, urgency and vaginal bleeding.  Neurological: Negative for dizziness, weakness, light-headedness and headaches.   Physical Exam   Blood pressure (!) 174/96, pulse 75, temperature 98.6 F (37 C), temperature source Oral, resp. rate 18, height 5\' 7"  (1.702 m), weight  82.6 kg, last menstrual period 12/01/2018, SpO2 100 %.  Patient Vitals for the past 24 hrs:  BP Temp Temp src Pulse Resp SpO2 Height Weight  03/24/19 1900 (!) 174/96 - - 75 - 100 % - -  03/24/19 1855 - - - - - 100 % - -  03/24/19 1845 (!) 181/108 - - 85 - 95 % - -  03/24/19 1834 (!) 169/111 - - - - - - -  03/24/19 1815 (!) 164/97 - - 86 - 100 % - -  03/24/19 1800 (!) 160/102 - - 84 - - - -  03/24/19 1750 (!) 157/93 - - 77 - - - -  03/24/19 1731 (!) 156/101 98.6 F (37 C) Oral 77 18 99 % - -  03/24/19 1728 - - - - - - 5\' 7"  (1.702 m) 82.6 kg    Physical Exam  Constitutional: She is oriented to person, place, and time. She appears well-developed and well-nourished. She appears distressed.  HENT:  Head: Normocephalic and atraumatic.  Respiratory: Effort normal.  GI: There is abdominal tenderness in the suprapubic area and left lower quadrant.  No tenderness in RLQ. Can palpate ctx during abdominal exam.  Genitourinary: There is no rash, tenderness or lesion on the right labia. There is no rash, tenderness or lesion on the left labia.    Vaginal discharge (thin, white discharge present at introitus) present.     No vaginal tenderness or bleeding.  No tenderness or bleeding in the vagina.    Genitourinary Comments: Bulging membrane bag, with peeling layers visible on speculum exam. Discharge at introitus sampled for swabs. CE: cervix dilated apprx 5cm   Neurological: She is alert and oriented to person, place, and time.  Skin: Skin is warm and dry. She is not diaphoretic.  Psychiatric: She has a normal mood and affect. Her behavior is normal.   No results found for this or any previous visit (from the past 24 hour(s)).  No results found.   MAU Course  Procedures  MDM -threatened miscarriage -elevated BP with severe range pressures -oral labetalol given, shortly after pt had severe range pressure of 180/108, followed by 174/96, per Dr. Rosana Snyder, Amy Snyder to start IV protocol after PO  admin of labetalol, preeclampsia protocol initiated  Orders Placed This Encounter  Procedures  . Wet prep, genital    Standing Status:   Standing    Number of Occurrences:   1  . [place] MFM OB LIMITED    Pt has bulging membranes, please idenitfy location of fetus    Standing Status:   Standing    Number of Occurrences:   1    Order Specific Question:   Symptom/Reason for Exam    Answer:   Abdominal pain in pregnancy [  [id number]]  . Urinalysis, Routine w reflex microscopic    Standing Status:   Standing    Number of Occurrences:   1  . CBC    Standing Status:   Standing    Number of Occurrences:   1  . Comprehensive metabolic panel    Standing Status:   Standing    Number of Occurrences:   1  . Measure blood pressure    Standing Status:   Standing    Number of Occurrences:   1  . Notify Physician    Confirmatory reading of BP> 160/110 15 minutes later    Standing Status:   Standing    Number of Occurrences:   1    Order Specific Question:   Notify Physician    Answer:   Temp greater than or equal to 100.4    Order Specific Question:   Notify Physician    Answer:   RR greater than 24 or less than 10    Order Specific Question:   Notify Physician    Answer:   HR greater than 120 or less than 50    Order Specific Question:   Notify Physician    Answer:   SBP greater than 160 mmHG or less than 80 mmHG    Order Specific Question:   Notify Physician    Answer:   DBP greater than 110 mmHG or less than 45 mmHG    Order Specific Question:   Notify Physician    Answer:   Urinary output is less than 157ml for any 4 hour period  . Measure blood pressure    20 minutes after giving hydralazine 10 MG IV dose.  Call MD if SBP >/= 160 or DBP >/= 110.    Standing Status:   Standing    Number of Occurrences:   1  . Insert peripheral IV    Standing Status:   Standing    Number of Occurrences:   1   Meds ordered this encounter  Medications  . lactated ringers bolus 1,000 mL  . labetalol  (NORMODYNE) tablet 300 mg  . AND Linked Order Group   . labetalol (NORMODYNE,TRANDATE) injection 20 mg   . labetalol (NORMODYNE,TRANDATE) injection 40 mg   . labetalol (NORMODYNE,TRANDATE) injection 80 mg   . hydrALAZINE (APRESOLINE) injection 10 mg    Assessment and Plan   1. Threatened miscarriage   2. Abdominal pain in pregnancy   3. [redacted] weeks gestation of pregnancy   4. Intact amniotic membranes during pregnancy in second trimester    -pt to [facility] specialty care -care transferred to Dr. Synthia Innocent E Nugent 03/24/2019, 7:08 PM     Attestation of Attending Supervision of Advanced Practitioner (PA/CNM/NP): Evaluation and management procedures were performed by the Advanced Practitioner under my supervision and collaboration.  I have reviewed the Advanced Practitioner's note and chart, and I agree with the management and plan.  Reviewed plan of care with patient, that with membranes in the vagina and regular painful contractions, likelihood of delivery in the near future is very high. Reviewed that fetus will not survive at this gestational age (dated by LMP confirmed with 1st trim [place]). Reviewed that if there is any concern for maternal infection, would recommend induction of labor to avoid life threatening infection to patient. Reviewed that even with history of classical c-section, would not recommend repeat c-section for non-viable fetus. She verbalizes understanding, to be admitted for pain management. Will plan for expectant management at this  point.  Pt to remain NPO. She has chronic HTN with elevated BP, did not take her meds this am, was given her usual meds and 20/40 mg IV labetalol for severe range BP with improvement.    Feliz Beam, M.D. Attending Center for [employer] (Faculty Practice)  03/24/2019 8:25 PM

## 2019-03-25 LAB — URINALYSIS, ROUTINE W REFLEX MICROSCOPIC
Bacteria, UA: NONE SEEN
Bilirubin Urine: NEGATIVE
Glucose, UA: NEGATIVE mg/dL
Ketones, ur: NEGATIVE mg/dL
Leukocytes,Ua: NEGATIVE
Nitrite: NEGATIVE
Protein, ur: NEGATIVE mg/dL
RBC / HPF: >50 RBC/hpf — ABNORMAL HIGH (ref 0–5)
Specific Gravity, Urine: 1.018 (ref 1.005–1.030)
pH: 7 (ref 5.0–8.0)

## 2019-03-25 MED ORDER — LABETALOL HCL 200 MG PO TABS
300.0000 mg | ORAL_TABLET | Freq: Two times a day (BID) | ORAL | Status: DC
  Administered 2019-03-25 – 2019-03-26 (×3): 300 mg via ORAL
  Filled 2019-03-25 (×3): qty 1

## 2019-03-25 MED ORDER — KETOROLAC TROMETHAMINE 30 MG/ML IJ SOLN
30.0000 mg | Freq: Four times a day (QID) | INTRAMUSCULAR | Status: DC | PRN
  Administered 2019-03-25 (×2): 30 mg via INTRAVENOUS
  Filled 2019-03-25 (×2): qty 1

## 2019-03-25 MED ORDER — PENICILLIN G 3 MILLION UNITS IVPB - SIMPLE MED
3.0000 10*6.[IU] | INTRAVENOUS | Status: DC
  Administered 2019-03-25 – 2019-03-26 (×9): 3 10*6.[IU] via INTRAVENOUS
  Filled 2019-03-25 (×19): qty 100

## 2019-03-25 MED ORDER — SODIUM CHLORIDE 0.9 % IV SOLN
5.0000 10*6.[IU] | Freq: Once | INTRAVENOUS | Status: AC
  Administered 2019-03-25: 5 10*6.[IU] via INTRAVENOUS
  Filled 2019-03-25: qty 5

## 2019-03-25 MED ORDER — PENICILLIN G POTASSIUM 5000000 UNITS IJ SOLR: 5.0000 10*6.[IU] | INTRAMUSCULAR | Status: DC

## 2019-03-25 MED ORDER — PROMETHAZINE HCL 25 MG/ML IJ SOLN
25.0000 mg | Freq: Four times a day (QID) | INTRAMUSCULAR | Status: DC | PRN
  Administered 2019-03-25: 19:00:00 25 mg via INTRAVENOUS
  Filled 2019-03-25: qty 1

## 2019-03-25 NOTE — Progress Notes (Signed)
Faculty Note  S: Patient still feeling regular contractions every 10 min or so, rating them very painful. Fentanyl has helped.   O: BP (!) 148/94 (BP Location: Left Arm)   Pulse 75   Temp 98.2 F (36.8 C) (Oral)   Resp 20   Ht 5\' 7"  (1.702 m)   Wt 82.6 kg   LMP 12/01/2018   SpO2 98%   BMI 28.54 kg/m   Gen: alert, oriented GU: some bloody fluid on pad SVE: taut bulging amniotic sac into vagina  Bedside US: bulging amniotic sac into vagina appears enlarged from prior US, no fetal parts in vagina   A/P: Pt is 28 y.o. [id number] @ [redacted]w[redacted]d who is admitted with membranes bulging into vagina. Now bleeding slightly increased. Had given option for signing 72 hr induction consent earlier with patient, she did not sign at that time. Has been getting fentanyl with some relief, offered epidural and she declines for now. NPO, will cont to monitor closely. Again reviewed that if she has significant bleeding, s/s infection, would recommend induction. Patient and her support person, her aunt, verbalize understanding and are in agreement with plan.     Feliz Beam, M.D. Attending Center for [employer] farm manager)

## 2019-03-25 NOTE — Progress Notes (Signed)
Faculty Note  S: Patient feeling okay, cramping improved with fentanyl. States her slight bleeding has stopped for now. Interested in signing 72 hr consent papers.   O: BP 127/80 (BP Location: Left Arm)   Pulse 75   Temp 97.7 F (36.5 C) (Oral)   Resp 18   Ht 5\' 7"  (1.702 m)   Wt 82.6 kg   LMP 12/01/2018   SpO2 100%   BMI 28.54 kg/m   Gen: alert, oriented SVE: deferred   A/P: Pt is 28 y.o. G5O0370 @ [redacted]w[redacted]d who is admitted for membranes hourglassing into the vagina. She is cramping regularly, had minimal bleeding earlier that has now stopped. Reviewed that if she starts bleeding again, or has s/s of maternal infection, would recommend induction of labor. She is agreeable to this plan.   Again reviewed option for elective induction of labor once 72 hrs passed, if not indicated before this time frame for worsening in maternal condition. I thoroughly discussed the consent and paperwork with her, she verbalized understanding of paperwork and signed those papers around 7:45 this am.   Answered all questions. Patient okay with continued IV fentanyl for pain relief for now Cont expectant management PCN started  Labetalol 300 mg BID for chronic HTN    K. Arvilla Meres, M.D. Attending Center for [employer] [facility])

## 2019-03-26 DIAGNOSIS — O039 Complete or unspecified spontaneous abortion without complication: Secondary | ICD-10-CM

## 2019-03-26 LAB — GC/CHLAMYDIA PROBE AMP (~~LOC~~) NOT AT ARMC
Chlamydia: NEGATIVE
Neisseria Gonorrhea: NEGATIVE

## 2019-03-26 LAB — ABO/RH: ABO/RH(D): A POS

## 2019-03-26 MED ORDER — IBUPROFEN 600 MG PO TABS: 600.0000 mg | ORAL_TABLET | Freq: Four times a day (QID) | ORAL | 3 refills | Status: DC | PRN

## 2019-03-26 MED ORDER — AZITHROMYCIN 250 MG PO TABS
1000.0000 mg | ORAL_TABLET | Freq: Once | ORAL | Status: AC
  Administered 2019-03-26: 1000 mg via ORAL
  Filled 2019-03-26: qty 4

## 2019-03-26 MED ORDER — AMLODIPINE BESYLATE 5 MG PO TABS: 5.0000 mg | ORAL_TABLET | Freq: Every day | ORAL | 2 refills | Status: DC

## 2019-03-26 MED ORDER — MISOPROSTOL 200 MCG PO TABS
ORAL_TABLET | ORAL | Status: AC
  Administered 2019-03-26: 800 ug
  Filled 2019-03-26: qty 4

## 2019-03-26 MED ORDER — OXYTOCIN 40 UNITS IN NORMAL SALINE INFUSION - SIMPLE MED
INTRAVENOUS | Status: AC
  Filled 2019-03-26: qty 1000

## 2019-03-26 NOTE — Progress Notes (Signed)
I offered support after delivery.  The time was short because pt understandably wanted to call her family.  Attempted later and pt was still on the phone.   [place], [place] Pager, [phone] 4:19 PM    03/26/19 1600  Clinical Encounter Type  Visited With Patient  Visit Type Spiritual support  Spiritual Encounters  Spiritual Needs Emotional;Grief support

## 2019-03-26 NOTE — Progress Notes (Addendum)
50, SVD of 16 wk fetus born en-caul with placenta expelling simultaneously. Dr Harolyn Rutherford at bedside for delivery. Apgars 0/0. 1800- Infant weight 5oz and 8 1/2 inches in length.

## 2019-03-26 NOTE — Progress Notes (Signed)
Faculty Note  S: Patient feeling okay,only slight bleeding overnight.  No other concerns.  O: BP 129/90 (BP Location: Left Arm)   Pulse 78   Temp 98.6 F (37 C) (Oral)   Resp 18   Ht 5\' 7"  (1.702 m)   Wt 82.6 kg   LMP 12/01/2018   SpO2 100%   BMI 28.54 kg/m   Gen: alert, oriented SVE: deferred   A/P: Pt is 28 y.o. [id number] @ [redacted]w[redacted]d who is admitted for membranes hourglassing into the vagina. Minimal bleeding. Re-reviewed that if she starts bleeding again, or has s/s of maternal infection, would recommend induction of labor. She is agreeable to this plan.  Signed 72 hour papers for elective induction of labor around 7:45 am on 03/25/2019.  Continue IV fentanyl for pain relief for now Cont expectant management PCN started  Labetalol 300 mg BID for chronic HTN Routine antenatal care   Verita Schneiders, MD, [place] for [place]

## 2019-03-26 NOTE — Progress Notes (Signed)
I offered support to [facility] as well as to her aunt who was in the room for part of our visit.  She is very much aware of the possibility that her pregnancy may not continue, but she is trying to hold on as long as she can.    [place], [place] Pager, [phone] 2:29 PM

## 2019-03-26 NOTE — Discharge Summary (Signed)
Physician Discharge Summary  [name] MRN: [id number] DOB/AGE: 05/11/1991 27 y.o.  Admit date: 03/24/2019 Discharge date: 03/26/2019  Admission Diagnoses: Inevitable miscarriage  Discharge Diagnoses:  Principal Problem:   Inevitable complete miscarriage without complication Active Problems:   Essential hypertension, benign  Discharged Condition: Stable  Hospital Course: Patient was admitted in the setting of inevitable miscarriage, BBOW in the vagina.  She was expectantly managed initially but kept having periodic bleeding episodes.  She agreed to IOL and signed 72 hour consent on 03/25/19. However, on 03/26/19, she spontaneously delivered. Please see progress note for more details. She was monitored for about four hours after delivery, bleeding was stable, and she was discharged to home with close follow up.   Consults: None  Significant Diagnostic Studies:  CBC Latest Ref Rng & Units 03/24/2019 01/11/2019 01/12/2017  WBC 4.0 - 10.5 K/uL 7.9 9.1 7.4  Hemoglobin 12.0 - 15.0 g/dL 11.4(L) 12.0 13.3  Hematocrit 36.0 - 46.0 % 34.3(L) 36.5 39.1  Platelets 150 - 400 K/uL 213 305 218   Treatments: IV hydration and antibiotics  Discharge Exam: Blood pressure (!) 137/99, pulse 91, temperature 98.3 F (36.8 C), temperature source Oral, resp. rate 16, height 5\' 7"  (1.702 m), weight 82.6 kg, last menstrual period 12/01/2018, SpO2 100 %. General appearance: alert, cooperative and no distress Resp: normal breath sounds Cardio: normal rate GI: soft, non-tender; fundus firm Pelvic: minimal bleeding Extremities: extremities normal, atraumatic, no cyanosis or edema and Homans sign is negative, no sign of DVT  Disposition: Discharge disposition: 01-Home or Self Care  Discharge Instructions    Discharge patient   Complete by:  As directed    Discharge disposition:  01-Home or Self Care   Discharge patient date:  03/26/2019     Allergies as of 03/26/2019      Reactions   Flagyl  [metronidazole] Itching, Swelling, Rash      Medication List    STOP taking these medications   aspirin EC 81 MG tablet   labetalol 100 MG tablet Commonly known as:  NORMODYNE     TAKE these medications   acetaminophen 500 MG tablet Commonly known as:  TYLENOL Take 1,000 mg by mouth every 6 (six) hours as needed for mild pain.   amLODipine 5 MG tablet Commonly known as:  Norvasc Take 1 tablet (5 mg total) by mouth daily.   ibuprofen 600 MG tablet Commonly known as:  ADVIL,MOTRIN Take 1 tablet (600 mg total) by mouth every 6 (six) hours as needed.        Signed: Verita Schneiders, MD 03/26/2019, 4:24 PM

## 2019-03-26 NOTE — Progress Notes (Signed)
Faculty Practice OB/GYN Attending Note  Called to evaluate patient with increased pain and pelvic pressure, BBOW noted at introitus. On my arrival, patient was inadventently pushing.  She pushed a couple of more times and delivered the fetus en caul at 1512, placenta was also delivered concurrently.  Had had about 300 ml of blood loss after delivery of fetus and placenta, bimanual exam revealed clots in vagina that were cleared, unable to advance further into uterus.  Placenta examined and looked intact for a 16 week placenta, no missing fragments noted. Cytotec 800 mcg was given per rectum, IV Pitocin also given.  Patient was stable after delivery, grieving appropriately. Appropriate support given to her. She desires discharge to home later today; the plan was made for discharge around 1900 today if her bleeding is okay and if she remains stable. Will have a follow up appointment at [facility], likely a virtual visit given current ongoing pandemic. Bleeding and infection precautions to be reviewed at time of discharge.   Verita Schneiders, MD, [place] for [employer], [place]

## 2019-03-26 NOTE — Discharge Instructions (Signed)
Coping with Pregnancy Loss  Pregnancy loss can happen any time during a pregnancy. Often the cause is not known. It is rarely because of anything you did. Pregnancy loss in early pregnancy (during the first trimester) is called a miscarriage. This type of pregnancy loss is the most common. Pregnancy loss that happens after 20 weeks of pregnancy is called fetal demise if the baby's heart stops beating before birth. Fetal demise is much less common. Some women experience spontaneous labor shortly after fetal demise resulting in a stillborn birth (stillbirth).  Any pregnancy loss can be devastating. You will need to recover both physically and emotionally. Most women are able to get pregnant again after a pregnancy loss and deliver a healthy baby.  How to manage emotional recovery    Pregnancy loss is very hard emotionally. You may feel many different emotions while you grieve. You may feel sad and angry. You may also feel guilty. It is normal to have periods of crying. Emotional recovery can take longer than physical recovery. It is different for everyone.  Taking these steps can help you cope:  · Remember that it is unlikely you did anything to cause the pregnancy loss.  · Share your thoughts and feelings with friends, family, and your partner. Remember that your partner is also recovering emotionally.  · Make sure you have a good support system, and do not spend too much time alone.  · Meet with a pregnancy loss counselor or join a pregnancy loss support group.  · Get enough sleep and eat a healthy diet. Return to regular exercise when you have recovered physically.  · Do not use drugs or alcohol to manage your emotions.  · Consider seeing a mental health professional to help you recover emotionally.  · Ask a friend or loved one to help you decide what to do with any clothing and nursery items you received for your baby.  In the case of a stillbirth, many women benefit from taking additional steps in the grieving  process. You may want to:  · Hold your baby after the birth.  · Name your baby.  · Request a birth certificate.  · Create a keepsake such as handprints or footprints.  · Dress your baby and have a picture taken.  · Make funeral arrangements.  · Ask for a baptism or blessing.  Hospitals have staff members who can help you with all these arrangements.  How to recognize emotional stress  It is normal to have emotional stress after a pregnancy loss. But emotional stress that lasts a long time or becomes severe requires treatment. Watch out for these signs of severe emotional stress:  · Sadness, anger, or guilt that is not going away and is interfering with your normal activities.  · Relationship problems that have occurred or gotten worse since the pregnancy loss.  · Signs of depression that last longer than 2 weeks. These may include:  ? Sadness.  ? Anxiety.  ? Hopelessness.  ? Loss of interest in activities you enjoy.  ? Inability to concentrate.  ? Trouble sleeping or sleeping too much.  ? Loss of appetite or overeating.  ? Thoughts of death or of hurting yourself.  Follow these instructions at home:  Medicines  · Take over-the-counter and prescription medicines only as told by your health care provider.  Activity  · Rest at home until your energy level returns. Return to your normal activities as told by your health care provider. Ask your health care   provider what activities are safe for you.  General instructions  · Keep all follow-up visits as told by your health care provider. This is important.  · It may be helpful to meet with others who have experienced pregnancy loss. Ask your health care provider about support groups and resources.  · To help you and your partner with the process of grieving, talk with your health care provider or seek counseling.  · When you are ready, meet with your health care provider to discuss steps to take for a future pregnancy.  Where to find more information  · U.S. Department of  Health and Human Services Office on Women's Health: www.womenshealth.gov  · American Pregnancy Association: www.americanpregnancy.org  Contact a health care provider if:  · You continue to experience grief, sadness, or lack of motivation for everyday activities, and those feelings do not improve over time.  · You are struggling to recover emotionally, especially if you are using alcohol or substances to help.  Get help right away if:  · You have thoughts of hurting yourself or others.  If you ever feel like you may hurt yourself or others, or have thoughts about taking your own life, get help right away. You can go to your nearest emergency department or call:  · Your local emergency services (911 in the [place]  · A suicide crisis helpline, such as the National Suicide Prevention Lifeline at 1-800-273-8255. This is open 24 hours a day.  Summary  · Any pregnancy loss can be difficult physically and emotionally.  · You may experience many different emotions while you grieve. Emotional recovery can last longer than physical recovery.  · It is normal to have emotional stress after a pregnancy loss. But emotional stress that lasts a long time or becomes severe requires treatment.  · See your health care provider if you are struggling emotionally after a pregnancy loss.  This information is not intended to replace advice given to you by your health care provider. Make sure you discuss any questions you have with your health care provider.  Document Released: 02/16/2018 Document Revised: 02/16/2018 Document Reviewed: 02/16/2018  Elsevier Interactive Patient Education © 2019 Elsevier Inc.

## 2019-03-29 ENCOUNTER — Ambulatory Visit: Payer: Medicaid Other

## 2019-04-02 ENCOUNTER — Other Ambulatory Visit: Payer: Self-pay

## 2019-04-02 ENCOUNTER — Encounter: Payer: Self-pay | Admitting: Obstetrics and Gynecology

## 2019-04-02 ENCOUNTER — Ambulatory Visit (INDEPENDENT_AMBULATORY_CARE_PROVIDER_SITE_OTHER): Payer: Medicaid Other | Admitting: Obstetrics and Gynecology

## 2019-04-02 DIAGNOSIS — I10 Essential (primary) hypertension: Secondary | ICD-10-CM

## 2019-04-02 DIAGNOSIS — O039 Complete or unspecified spontaneous abortion without complication: Secondary | ICD-10-CM

## 2019-04-02 DIAGNOSIS — Z3009 Encounter for other general counseling and advice on contraception: Secondary | ICD-10-CM

## 2019-04-02 NOTE — Progress Notes (Signed)
TELEHEALTH VIRTUAL POSTPARTUM VISIT ENCOUNTER NOTE  I connected with@ on 04/02/19 at  3:15 PM EDT by telephone at home and verified that I am speaking with the correct person using two identifiers.   I discussed the limitations, risks, security and privacy concerns of performing an evaluation and management service by telephone and the availability of in person appointments. I also discussed with the patient that there may be a patient responsible charge related to this service. The patient expressed understanding and agreed to proceed.  Appointment Date: 04/02/2019  OBGYN Clinic: [facility]  Chief Complaint:  Chief Complaint  Patient presents with  . Miscarriage    History of Present Illness: Saanvika Vazques is a 28 y.o. African-American G2P0101 (Patient's last menstrual period was 12/01/2018.), seen for the above chief complaint. Her past medical history is significant for HTN.  She is s/p SAB on 03/26/19 at [redacted]w[redacted]d weeks; presented with pre-term labor, membranes bulging into vagina, delivered 2 days later. She was discharged to home the following day.   She is having some cramping, reports she is taking pain medications and doing alright. Cramping feels like her normal menstrual period. Did have a piece of tissue come out yesterday. Still having moderate bleeding, sometimes heavier, sometimes lighter. No issues with bowel/bladder. Reports no issues with appetite, no issue with nausea or vomiting. She is taking her BP meds regularly and denies headache or other issues.   Emotionally, she reports she is "way better" than earlier this week. Can go without crying some, able to laugh and do daily activities. Reports she is not "100% back to herself, but she is doing okay." No thoughts of hurting herself or anyone else.   Review of Systems: Positive for n/a. Her 12 point review of systems is negative or as noted in the History of Present Illness.  Patient Active Problem List   Diagnosis Date Noted   . Inevitable complete miscarriage without complication 54/00/8676  . Supervision of high risk pregnancy, antepartum 02/14/2019  . History of preterm delivery 02/14/2019  . Chronic hypertension affecting pregnancy 02/14/2019  . Pelvic mass during pregnancy 02/14/2019  . History of cesarean section, classical 02/14/2019  . Endometriosis 12/06/2017  . Abnormal Pap smear of cervix 09/26/2016  . Essential hypertension, benign 07/03/2014  . 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 Current Outpatient Medications  Medication Sig Dispense Refill  . acetaminophen (TYLENOL) 500 MG tablet Take 1,000 mg by mouth every 6 (six) hours as needed for mild pain.    Marland Kitchen amLODipine (NORVASC) 5 MG tablet Take 1 tablet (5 mg total) by mouth daily. 30 tablet 2  . ibuprofen (ADVIL,MOTRIN) 600 MG tablet Take 1 tablet (600 mg total) by mouth every 6 (six) hours as needed. 60 tablet 3   No current facility-administered medications for this visit.     Allergies Flagyl [metronidazole]  Physical Exam:  General:  Alert, oriented and cooperative.   Mental Status: Normal mood and affect perceived. Normal judgment and thought content.  Rest of physical exam deferred due to type of encounter  PP Depression Screening:   negative  Assessment:Patient is a 28 y.o. [id number] who is 2 weeks postpartum from a delivery at 17 weeks.  She is doing well. Still having bleeding.  Plan:  1. SAB (spontaneous abortion) - due to contractions/pre-term labor @ 17 weeks, no obvious cause - discussed path, not due to cervical incompetence but no obvious source for pre-term labor  -  plan for MFM consult prior to next pregnancy - patient agreeable to plan - will call with any issues - reviewed bleeding is normal after delivery and to call if she has heavy or ongoing bleeding - doing well mentally, no s/s depression, encouraged her to call if she feels she is  not doing well  2. Encounter for other general counseling or advice on contraception Declines contraception aside from condoms for now  3. Essential hypertension Cont norvasc    RTC 2 months  I discussed the assessment and treatment plan with the patient. The patient was provided an opportunity to ask questions and all were answered. The patient agreed with the plan and demonstrated an understanding of the instructions.   The patient was advised to call back or seek an in-person evaluation/go to the ED for any concerning postpartum symptoms.  I provided 24 minutes of non-face-to-face time during this encounter.   Sloan Leiter, MD Center for [place], [place]

## 2019-04-12 ENCOUNTER — Encounter: Payer: Medicaid Other | Admitting: Obstetrics and Gynecology

## 2019-04-12 ENCOUNTER — Other Ambulatory Visit (HOSPITAL_COMMUNITY): Payer: Medicaid Other

## 2019-04-19 ENCOUNTER — Other Ambulatory Visit: Payer: Self-pay | Admitting: *Deleted

## 2019-04-19 MED ORDER — FLUCONAZOLE 150 MG PO TABS: 150.0000 mg | ORAL_TABLET | Freq: Once | ORAL | 0 refills | Status: AC

## 2019-04-19 NOTE — Progress Notes (Signed)
Pt called to office with vaginal itch/irritation. Pt denies d/c with odor, more itching. Pt request Diflucan to treat. Rx sent to pharmacy per protocol.

## 2019-05-16 ENCOUNTER — Other Ambulatory Visit (HOSPITAL_COMMUNITY)
Admission: RE | Admit: 2019-05-16 | Discharge: 2019-05-16 | Disposition: A | Discharge status: Home / Self Care | Payer: Medicaid Other | Source: Ambulatory Visit | Attending: Family Medicine | Admitting: Family Medicine

## 2019-05-16 ENCOUNTER — Encounter: Payer: Self-pay | Admitting: Family Medicine

## 2019-05-16 ENCOUNTER — Other Ambulatory Visit: Payer: Self-pay

## 2019-05-16 ENCOUNTER — Ambulatory Visit (INDEPENDENT_AMBULATORY_CARE_PROVIDER_SITE_OTHER): Payer: Medicaid Other | Admitting: Family Medicine

## 2019-05-16 VITALS — BP 130/70 | HR 67

## 2019-05-16 DIAGNOSIS — N898 Other specified noninflammatory disorders of vagina: Secondary | ICD-10-CM | POA: Diagnosis not present

## 2019-05-16 LAB — POCT WET PREP (WET MOUNT)
Clue Cells Wet Prep Whiff POC: NEGATIVE
Trichomonas Wet Prep HPF POC: ABSENT

## 2019-05-16 MED ORDER — FLUCONAZOLE 150 MG PO TABS: 150.0000 mg | ORAL_TABLET | Freq: Once | ORAL | 0 refills | Status: AC

## 2019-05-16 NOTE — Progress Notes (Signed)
     Subjective: Chief Complaint  Patient presents with  . STD screen  . Vaginal Discharge  . Vaginal Itching     HPI: Amy Snyder is a 28 y.o. presenting to clinic today to discuss the following:  Vaginal Itching Patient reports vaginal itching started yesterday. She states it "feels like yeast infection" but she did recently have unprotected sex with an old partner.  She denies rash, fever, chills, n/v/ abdominal pain, dysuria, blood in the urine or stool, pain during intercourse.     ROS noted in HPI.   Past Medical, Surgical, Social, and Family History Reviewed & Updated per EMR.   Pertinent Historical Findings include:   Social History   Tobacco Use  Smoking Status Former Smoker  . Packs/day: 0.20  . Years: 3.00  . Pack years: 0.60  . Types: Cigarettes  Smokeless Tobacco Never Used  Tobacco Comment   smokes socially    Objective: BP 130/70   Pulse 67   LMP 12/01/2018   SpO2 98%  Vitals and nursing notes reviewed  Physical Exam Gen: Alert and Oriented x 3, NAD HEENT: Normocephalic, atraumatic CV: RRR, no murmurs, normal S1, S2 split Resp: CTAB, no wheezing, rales, or rhonchi, comfortable work of breathing Abd: non-distended, non-tender, soft, +bs in all four quadrants GU: whitish, thick copious discharge in vaginal vault, cervix and vaginal mucosa normal, no rash or bleeding Ext: no clubbing, cyanosis, or edema Skin: warm, dry, intact, no rashes  LABS - 5/27 GC/CC - neg Wet Prep - few yeast, mod bacteria HIV - non-reactive  Assessment/Plan:  Vaginal discharge Itching with white, thick vaginal discharge with yeast on wet prep consistent with yeast infection. - Fluconazole 150mg  once - F/u if needed   PATIENT EDUCATION PROVIDED: See AVS    Diagnosis and plan along with any newly prescribed medication(s) were discussed in detail with this patient today. The patient verbalized understanding and agreed with the plan. Patient advised if symptoms  worsen return to clinic or ER.    Orders Placed This Encounter  Procedures  . POCT Wet Prep [facility])    Meds ordered this encounter  Medications  . fluconazole (DIFLUCAN) 150 MG tablet    Sig: Take 1 tablet (150 mg total) by mouth once for 1 dose.    Dispense:  1 tablet    Refill:  0     Harolyn Rutherford, DO 05/16/2019, 4:36 PM PGY-2 [place]

## 2019-05-16 NOTE — Patient Instructions (Signed)
It was great to meet you today! Thank you for letting me participate in your care!  Today, we discussed your symptoms and we performed several tests. I believe you have a yeast infection and I have sent one pill of Diflucan to the pharmacy. Please take it as prescribed.  I will call you with the rest of your test results.  Be well, Amy Rutherford, DO PGY-2, [facility]

## 2019-05-17 LAB — HIV ANTIBODY (ROUTINE TESTING W REFLEX): HIV Screen 4th Generation wRfx: NONREACTIVE

## 2019-05-18 LAB — CERVICOVAGINAL ANCILLARY ONLY
Chlamydia: NEGATIVE
Neisseria Gonorrhea: NEGATIVE

## 2019-05-23 ENCOUNTER — Telehealth: Payer: Self-pay | Admitting: *Deleted

## 2019-05-23 NOTE — Telephone Encounter (Signed)
Pt requesting results from last visit. Amy Snyder, CMA

## 2019-05-24 NOTE — Assessment & Plan Note (Signed)
Itching with white, thick vaginal discharge with yeast on wet prep consistent with yeast infection. - Fluconazole 150mg  once - F/u if needed

## 2019-06-04 ENCOUNTER — Ambulatory Visit: Payer: Medicaid Other | Admitting: Obstetrics and Gynecology

## 2019-06-29 ENCOUNTER — Other Ambulatory Visit: Payer: Self-pay

## 2019-06-29 ENCOUNTER — Ambulatory Visit (INDEPENDENT_AMBULATORY_CARE_PROVIDER_SITE_OTHER): Payer: Medicaid Other | Admitting: Family Medicine

## 2019-07-02 ENCOUNTER — Ambulatory Visit (INDEPENDENT_AMBULATORY_CARE_PROVIDER_SITE_OTHER): Payer: Medicaid Other | Admitting: Family Medicine

## 2019-07-02 ENCOUNTER — Other Ambulatory Visit (HOSPITAL_COMMUNITY)
Admission: RE | Admit: 2019-07-02 | Discharge: 2019-07-02 | Disposition: A | Discharge status: Home / Self Care | Payer: Medicaid Other | Source: Ambulatory Visit | Attending: Family Medicine | Admitting: Family Medicine

## 2019-07-02 ENCOUNTER — Other Ambulatory Visit: Payer: Self-pay

## 2019-07-02 VITALS — BP 142/96 | HR 92

## 2019-07-02 DIAGNOSIS — N898 Other specified noninflammatory disorders of vagina: Secondary | ICD-10-CM | POA: Diagnosis not present

## 2019-07-02 DIAGNOSIS — I10 Essential (primary) hypertension: Secondary | ICD-10-CM | POA: Diagnosis not present

## 2019-07-02 DIAGNOSIS — F4321 Adjustment disorder with depressed mood: Secondary | ICD-10-CM

## 2019-07-02 MED ORDER — AMLODIPINE BESYLATE 10 MG PO TABS: 10.0000 mg | ORAL_TABLET | Freq: Every day | ORAL | 2 refills | Status: DC

## 2019-07-02 NOTE — Patient Instructions (Addendum)
It was nice to meet you today,  We have performed STI testing and I will inform you of the results when I get them.  We do not need to test for BV or yeast infection as you have already treated yourself for these conditions recently and you report improvement of your symptoms.  If your symptoms do not fully resolve or they get worse that would be reason to come back so we can do further work-up.  For your blood pressure I would like you to take 10 mg of amlodipine daily.  I have written you a new prescription.  Please follow-up with your primary care provider within the next 6 weeks to check on your blood pressure.  Have a great day,  Clemetine Marker, MD

## 2019-07-02 NOTE — Progress Notes (Signed)
   [facility] Phone: [phone]   cc: Vaginal itching and discharge, STI check  Subjective:  Vaginal itching l: Patient states that she had been having vaginal itching for the past week along with a discharge she described as "thick, white, creamy".  Patient had a refill for metronidazole gel for previous episode of BV that she started taking 5 days ago.  2 days ago she also took a fluconazole pill.  She states that the pruritus and discharge have improved since she started taking the medication, although the gel makes it hard to determine where the discharge is coming from.  She is not having any burning with urination, no vaginal pain.  STI: The patient had a new female partner with whom she had unprotected sex 1 time on July 4.  She did not use condoms and she does not use birth control.  Patient does not want to get a pregnancy test today, does not believe she is pregnant.  Patient is not interested in birth control at this time.  States she is "done that before".  HTN: Patient has been taking her blood pressure medicine daily, but has not taken it today because of how early it is in the morning.  Patient states that she had a miscarriage on April 6.  She states she is doing much better now emotionally.  Does not feel depressed.  ROS: See HPI for pertinent positives and negatives  Past Medical History  Family history reviewed for today's visit. No changes.  Objective: BP (!) 142/96   Pulse 92   LMP 06/21/2019 (Approximate)   SpO2 100%  Gen: NAD, alert and oriented, cooperative with exam HEENT: NCAT,  Resp: Normal work of breathing, no tachypnea Skin: No rashes, no lesions Psych: Appropriate behavior, normal mood.  Pleasant affect  Assessment/Plan: Vaginal discharge Patient has had 1 week of vaginal itching and thick white discharge.  Last had sexual intercourse on July 4, which was unprotected with a new partner.  Patient is already taken 5 days of  metronidazole gel, and Diflucan 2 days ago.  Since taking these medications her symptoms have improved.  For these reasons did not get a wet prep on the patient since she already self treated with improvement of symptoms.  Will get STI testing given recent sexual intercourse with new partner and patient desire to be tested.  Will not get HIV test since patient had a negative test approximately 1 month ago and new partner was approximately 2 weeks ago patient has not had any symptoms in this time. - RPR, urine GC chlamydia.  Essential hypertension, benign Patient has been on 5 mg amlodipine since April.  She takes it regularly, but has not taken it yet today.  Blood pressure today was 138/100, repeat was 140/96.  Discussed increasing amlodipine with patient. -Increase amlodipine to 10 mg daily. - Follow-up with PCP.  Grief associated with loss of fetus Patient states she had a miscarriage on April 6.  She feels like she is moving much better now emotionally and she was a few months ago.  States she does not feel depressed and does not endorse symptoms of depression.     Clemetine Marker, MD PGY-2

## 2019-07-03 DIAGNOSIS — F4321 Adjustment disorder with depressed mood: Secondary | ICD-10-CM | POA: Insufficient documentation

## 2019-07-03 LAB — URINE CYTOLOGY ANCILLARY ONLY
Chlamydia: NEGATIVE
Neisseria Gonorrhea: NEGATIVE

## 2019-07-03 LAB — RPR: RPR Ser Ql: NONREACTIVE

## 2019-07-03 NOTE — Assessment & Plan Note (Signed)
Patient states she had a miscarriage on April 6.  She feels like she is moving much better now emotionally and she was a few months ago.  States she does not feel depressed and does not endorse symptoms of depression.

## 2019-07-03 NOTE — Assessment & Plan Note (Signed)
Patient has been on 5 mg amlodipine since April.  She takes it regularly, but has not taken it yet today.  Blood pressure today was 138/100, repeat was 140/96.  Discussed increasing amlodipine with patient. -Increase amlodipine to 10 mg daily. - Follow-up with PCP.

## 2019-07-03 NOTE — Assessment & Plan Note (Signed)
Patient has had 1 week of vaginal itching and thick white discharge.  Last had sexual intercourse on July 4, which was unprotected with a new partner.  Patient is already taken 5 days of metronidazole gel, and Diflucan 2 days ago.  Since taking these medications her symptoms have improved.  For these reasons did not get a wet prep on the patient since she already self treated with improvement of symptoms.  Will get STI testing given recent sexual intercourse with new partner and patient desire to be tested.  Will not get HIV test since patient had a negative test approximately 1 month ago and new partner was approximately 2 weeks ago patient has not had any symptoms in this time. - RPR, urine GC chlamydia.

## 2019-07-04 ENCOUNTER — Telehealth: Payer: Self-pay | Admitting: Family Medicine

## 2019-07-04 NOTE — Telephone Encounter (Signed)
Called patient left voicemail to tell her the results of her most recent blood work were negative.  Told her she could call back at [phone] for any questions.

## 2019-07-13 ENCOUNTER — Other Ambulatory Visit: Payer: Self-pay

## 2019-07-13 ENCOUNTER — Encounter: Payer: Self-pay | Admitting: Family Medicine

## 2019-07-13 ENCOUNTER — Ambulatory Visit (INDEPENDENT_AMBULATORY_CARE_PROVIDER_SITE_OTHER): Payer: Medicaid Other | Admitting: Family Medicine

## 2019-07-13 ENCOUNTER — Other Ambulatory Visit: Payer: Self-pay | Admitting: Obstetrics

## 2019-07-13 VITALS — BP 140/90 | HR 83 | Ht 67.0 in | Wt 177.4 lb

## 2019-07-13 DIAGNOSIS — R600 Localized edema: Secondary | ICD-10-CM | POA: Diagnosis not present

## 2019-07-13 DIAGNOSIS — B373 Candidiasis of vulva and vagina: Secondary | ICD-10-CM

## 2019-07-13 DIAGNOSIS — N898 Other specified noninflammatory disorders of vagina: Secondary | ICD-10-CM

## 2019-07-13 DIAGNOSIS — B3731 Acute candidiasis of vulva and vagina: Secondary | ICD-10-CM

## 2019-07-13 DIAGNOSIS — N750 Cyst of Bartholin's gland: Secondary | ICD-10-CM | POA: Diagnosis not present

## 2019-07-13 LAB — POCT WET PREP (WET MOUNT)
Clue Cells Wet Prep Whiff POC: NEGATIVE
Trichomonas Wet Prep HPF POC: ABSENT

## 2019-07-13 MED ORDER — SITZ BATH MISC: 1.0000 | Freq: Every day | 0 refills | Status: DC

## 2019-07-13 NOTE — Progress Notes (Signed)
   Subjective:    [name], female    DOB: [birth year], 28 y.o.   MRN: [id number]   CC: Vaginal lump, vaginal discharge, leg swelling  HPI: Vaginal lump: Patient was showering yesterday and noticed a lump in her right labia that is mildly tender to touch.  She states she has never noticed to being there before.  She has not tried any treatments that have made it better, nothing seems to make it worse.  Sometimes walking and sitting can elicit pain from the site.  She is not sure what it is.  Vaginal discharge: Patient reports a couple days of vaginal discharge that produces mild itchiness, but no burning.  She does not notice any changes in odor.  She denies any new sexual partners.  She has not tried anything to make the symptoms better or worse.  She denies any change in soaps or body washes.  She denies any fevers, myalgias, headaches, dysuria, urinary frequency, or pelvic pain.  Lower extremity swelling: The patient reports about 2 years of lower leg swelling.  The patient is taking Norvasc for blood pressure, but did not start taking this until April 2020.  The patient does not know if the swelling is better in the morning or in the afternoon.  She has not tried anything to make the swelling better.  Review of Systems: see HPI   Objective:  BP 140/90   Pulse 83   Ht 5\' 7"  (1.702 m)   Wt 177 lb 6 oz (80.5 kg)   LMP 06/21/2019 (Approximate)   SpO2 99%   BMI 27.78 kg/m  Vitals and nursing note reviewed  General: well nourished, in no acute distress, pleasant patient Neck: supple, non-tender, without lymphadenopathy Cardiac: RRR, clear S1 and S2, no murmurs appreciated Respiratory: CTA bilaterally, no increased work of breathing Abdomen: Soft, no tenderness to palpation, bowel sounds auscultated throughout Extremities: 1+ nonpitting edema to bilateral lower extremities, warm, well-perfused, 2+ PT and DP pulses to extremities bilaterally GU: Tender, mobile mass  profound to right labia, no erythema appreciated; white thick discharge noted on exam Neuro: alert and oriented, no focal deficits, sensation intact bilateral upper and lower extremities   Assessment & Plan:   Bartholin cyst Noticed 1 day ago, minimal pain to palpation -Sitz Baths -Referral to OBGYN  Bilateral leg edema Patient has had since 2 years, has not noticed if better or worse any particular time of day, no varicosities of concern -Discussed decreasing salt intake -Discussed elevating legs -Will prescribe compression stockings as needed -Consider side effect from Amlodipine - discuss with PCP  Vaginal discharge  Only bacteria on wet prep, no yeast or BV appreciated -No treatment at this time  No follow-ups on file.   Dr. Milus Banister [facility], PGY-2

## 2019-07-13 NOTE — Assessment & Plan Note (Signed)
Patient has had since 2 years, has not noticed if better or worse any particular time of day, no varicosities of concern -Discussed decreasing salt intake -Discussed elevating legs -Will prescribe compression stockings as needed -Consider side effect from Amlodipine - discuss with PCP

## 2019-07-13 NOTE — Assessment & Plan Note (Signed)
Noticed 1 day ago, minimal pain to palpation -Sitz Baths -Referral to [facility]

## 2019-07-13 NOTE — Patient Instructions (Addendum)
Thank you for coming in to see [place] today! Please see below to review our plan for today's visit:  1. Decreased salt intake from your diet to hold onto less water to decrease leg swelling. Elevate your legs when seated to decrease swelling. 2. You have been referred to OBGYN to address the Bartholin Cyst. Use a Sitz Bath to help with swelling or discomfort. Add  to 1 tablespoon (5 mL to 15 mL) of baking soda or 1 to 2 teaspoons (5 mL to 10 mL) of salt to the water in the plastic sitz bath. Swirl the water until the baking soda or salt dissolves. 5. Carefully sit down in the plastic sitz bath and soak your bottom area for 10 to 15 minutes. 3. Your swab today did not reveal any infection that needs to be treated. Please monitor for any changes to discharge and let us know if anything changes for the worse.  Please call the clinic at [phone] if your symptoms worsen or you have any concerns. It was our pleasure to serve you!    Dr. Milus Banister [facility]   Edema  Edema is when you have too much fluid in your body or under your skin. Edema may make your legs, feet, and ankles swell up. Swelling is also common in looser tissues, like around your eyes. This is a common condition. It gets more common as you get older. There are many possible causes of edema. Eating too much salt (sodium) and being on your feet or sitting for a long time can cause edema in your legs, feet, and ankles. Hot weather may make edema worse. Edema is usually painless. Your skin may look swollen or shiny. Follow these instructions at home:  Keep the swollen body part raised (elevated) above the level of your heart when you are sitting or lying down.  Do not sit still or stand for a long time.  Do not wear tight clothes. Do not wear garters on your upper legs.  Exercise your legs. This can help the swelling go down.  Wear elastic bandages or support stockings as told by your doctor.  Eat a  low-salt (low-sodium) diet to reduce fluid as told by your doctor.  Depending on the cause of your swelling, you may need to limit how much fluid you drink (fluid restriction).  Take over-the-counter and prescription medicines only as told by your doctor. Contact a doctor if:  Treatment is not working.  You have heart, liver, or kidney disease and have symptoms of edema.  You have sudden and unexplained weight gain. Get help right away if:  You have shortness of breath or chest pain.  You cannot breathe when you lie down.  You have pain, redness, or warmth in the swollen areas.  You have heart, liver, or kidney disease and get edema all of a sudden.  You have a fever and your symptoms get worse all of a sudden. Summary  Edema is when you have too much fluid in your body or under your skin.  Edema may make your legs, feet, and ankles swell up. Swelling is also common in looser tissues, like around your eyes.  Raise (elevate) the swollen body part above the level of your heart when you are sitting or lying down.  Follow your doctor's instructions about diet and how much fluid you can drink (fluid restriction).

## 2019-07-13 NOTE — Assessment & Plan Note (Signed)
Only bacteria on wet prep, no yeast or BV appreciated -No treatment at this time

## 2019-07-14 ENCOUNTER — Ambulatory Visit (HOSPITAL_COMMUNITY)
Admission: EM | Admit: 2019-07-14 | Discharge: 2019-07-14 | Discharge status: Against Medical Advice | Payer: Medicaid Other

## 2019-07-14 NOTE — ED Notes (Signed)
Continues with no response from waiting area.  Triage staff states they do not see pt in waiting area.

## 2019-07-14 NOTE — ED Notes (Signed)
No response when called from waiting area.

## 2019-07-14 NOTE — ED Triage Notes (Signed)
Pt states she has a vaginal cyst. Pt states its been there for 3 days. Pt states her period is on as well.

## 2019-07-18 ENCOUNTER — Ambulatory Visit: Payer: Medicaid Other | Admitting: Obstetrics

## 2019-07-24 ENCOUNTER — Other Ambulatory Visit: Payer: Self-pay

## 2019-07-24 ENCOUNTER — Ambulatory Visit (INDEPENDENT_AMBULATORY_CARE_PROVIDER_SITE_OTHER): Payer: Medicaid Other | Admitting: Family Medicine

## 2019-07-24 ENCOUNTER — Ambulatory Visit (HOSPITAL_COMMUNITY)
Admission: RE | Admit: 2019-07-24 | Discharge: 2019-07-24 | Disposition: A | Discharge status: Home / Self Care | Payer: Medicaid Other | Source: Ambulatory Visit | Attending: Family Medicine | Admitting: Family Medicine

## 2019-07-24 ENCOUNTER — Encounter: Payer: Self-pay | Admitting: Family Medicine

## 2019-07-24 VITALS — BP 140/90 | HR 73

## 2019-07-24 DIAGNOSIS — R0789 Other chest pain: Secondary | ICD-10-CM | POA: Diagnosis not present

## 2019-07-24 DIAGNOSIS — R079 Chest pain, unspecified: Secondary | ICD-10-CM | POA: Diagnosis not present

## 2019-07-24 NOTE — Patient Instructions (Addendum)
It was nice meeting you today Ms. Soucy!  Your EKG looked normal today.  I think that your pain sounds more like acid reflux and a strained pectoral muscle than anything related to your heart.  It is safe for you to continue taking amlodipine 10 mg daily since this medication is very helpful for your blood pressure, but please call us if you continue to have issues.  You can also take Pepcid over-the-counter for acid reflux if this continues to give you problems.  If you have any questions or concerns, please feel free to call the clinic.   Be well,  Dr. Shan Levans

## 2019-07-24 NOTE — Progress Notes (Signed)
   Subjective:    Amy Snyder - 28 y.o. female MRN [id number]  Date of birth: [birth year]  CC:  Amy Snyder is here for chest pain.  HPI: Chest pain Amy Snyder reports that she developed burning, midsternal chest pain shortly after taking her new dose of amlodipine 5 mg this morning.  She has been on amlodipine 5 mg daily recently and has tolerated this well, but due to continued high blood pressure, her dose was increased to 10 mg with the first dose being this morning.  She denies having nausea, diaphoresis, and shortness of breath during episodes of chest pain.  The pain did not radiate and is not exertional.  She denies palpitations.  She does say that shortly before her chest pain she had a biscuit from [employer].  She also has an active job that requires lifting.  Health Maintenance:  Health Maintenance Due  Topic Date Due  . TETANUS/TDAP  10/01/2009  . INFLUENZA VACCINE  07/21/2019    -  reports that she has quit smoking. Her smoking use included cigarettes. She has a 0.60 pack-year smoking history. She has never used smokeless tobacco. - Review of Systems: Per HPI. - Past Medical History: Patient Active Problem List   Diagnosis Date Noted  . Chest pain of uncertain etiology 94/17/4081  . Bartholin cyst 07/13/2019  . Bilateral leg edema 07/13/2019  . Grief associated with loss of fetus 07/03/2019  . Inevitable complete miscarriage without complication 44/81/8563  . Supervision of high risk pregnancy, antepartum 02/14/2019  . History of preterm delivery 02/14/2019  . Chronic hypertension affecting pregnancy 02/14/2019  . Pelvic mass during pregnancy 02/14/2019  . History of cesarean section, classical 02/14/2019  . Endometriosis 12/06/2017  . Abnormal Pap smear of cervix 09/26/2016  . Vaginal discharge 06/01/2016  . Essential hypertension, benign 07/03/2014  . 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  This is highly unlikely to be chest pain due to CAD since patient's description is atypical.  Furthermore, amlodipine is not known to cause chest pain.  I offered patient an EKG today in the office, which she agreed to.  EKG was normal sinus rhythm and did not show any signs of ischemia.  I gave her the option of decreasing amlodipine dose to 5 mg, stopping amlodipine, or continuing the 10 mg with the knowledge that this is very unlikely to be caused by amlodipine and unlikely to be ischemic in nature.  Patient expressed understanding and elected to continue with amlodipine 10 mg daily.  She will let us know if her chest pain does not resolve.    Maia Breslow, M.D. 07/25/2019, 7:44 AM PGY-3, [place]

## 2019-07-25 DIAGNOSIS — R079 Chest pain, unspecified: Secondary | ICD-10-CM | POA: Insufficient documentation

## 2019-07-25 NOTE — Assessment & Plan Note (Signed)
Since her pain is described as burning and is reproducible on palpation, it may be a combination of GERD and muscle strain.  This is highly unlikely to be chest pain due to CAD since patient's description is atypical.  Furthermore, amlodipine is not known to cause chest pain.  I offered patient an EKG today in the office, which she agreed to.  EKG was normal sinus rhythm and did not show any signs of ischemia.  I gave her the option of decreasing amlodipine dose to 5 mg, stopping amlodipine, or continuing the 10 mg with the knowledge that this is very unlikely to be caused by amlodipine and unlikely to be ischemic in nature.  Patient expressed understanding and elected to continue with amlodipine 10 mg daily.  She will let us know if her chest pain does not resolve.

## 2019-09-12 ENCOUNTER — Other Ambulatory Visit (HOSPITAL_COMMUNITY)
Admission: RE | Admit: 2019-09-12 | Discharge: 2019-09-12 | Disposition: A | Discharge status: Home / Self Care | Payer: Medicaid Other | Source: Ambulatory Visit | Attending: Family Medicine | Admitting: Family Medicine

## 2019-09-12 ENCOUNTER — Encounter: Payer: Self-pay | Admitting: Family Medicine

## 2019-09-12 ENCOUNTER — Ambulatory Visit (INDEPENDENT_AMBULATORY_CARE_PROVIDER_SITE_OTHER): Payer: Medicaid Other | Admitting: Family Medicine

## 2019-09-12 ENCOUNTER — Other Ambulatory Visit: Payer: Self-pay | Admitting: Family Medicine

## 2019-09-12 ENCOUNTER — Other Ambulatory Visit: Payer: Self-pay

## 2019-09-12 VITALS — BP 112/72

## 2019-09-12 DIAGNOSIS — N760 Acute vaginitis: Secondary | ICD-10-CM | POA: Diagnosis not present

## 2019-09-12 DIAGNOSIS — Z113 Encounter for screening for infections with a predominantly sexual mode of transmission: Secondary | ICD-10-CM

## 2019-09-12 DIAGNOSIS — B9689 Other specified bacterial agents as the cause of diseases classified elsewhere: Secondary | ICD-10-CM | POA: Diagnosis not present

## 2019-09-12 LAB — POCT WET PREP (WET MOUNT)
Clue Cells Wet Prep Whiff POC: POSITIVE
Trichomonas Wet Prep HPF POC: ABSENT

## 2019-09-12 MED ORDER — METRONIDAZOLE 0.75 % VA GEL: 1.0000 | Freq: Two times a day (BID) | VAGINAL | Status: DC

## 2019-09-12 NOTE — Progress Notes (Signed)
Subjective: Chief Complaint  Patient presents with  . std screen    HPI: Amy Snyder is a 28 y.o. presenting to clinic today to discuss the following:  Vaginal Discharge Patient presents today for vaginal discharge that is milky white in color and a change in odor. She states she has been diagnosed with BV in the past and this is usually how it presents for her. She is not having any itching or rash in her genital region. She denies any new sexual partner but has had unprotected sex. She denies pelvic pain, vaginal bleeding, fever, chills, or dysuria. She declines blood testing for HIV and syphilis.  ROS noted in HPI.    Social History   Tobacco Use  Smoking Status Former Smoker  . Packs/day: 0.20  . Years: 3.00  . Pack years: 0.60  . Types: Cigarettes  Smokeless Tobacco Never Used  Tobacco Comment   smokes socially   Objective: BP 112/72  Vitals and nursing notes reviewed  Physical Exam Vitals signs reviewed. Exam conducted with a chaperone present.  Constitutional:      General: She is not in acute distress.    Appearance: Normal appearance.  Genitourinary:    General: Normal vulva.     Exam position: Supine.     Pubic Area: No rash.      Labia:        Right: No rash, tenderness or lesion.        Left: No rash, tenderness or lesion.      Vagina: Normal. No erythema or bleeding.     Cervix: No cervical motion tenderness, discharge, lesion, erythema or cervical bleeding.     Uterus: Normal.   Skin:    General: Skin is warm and dry.     Findings: No erythema or rash.  Neurological:     Mental Status: She is alert.  Psychiatric:        Mood and Affect: Mood normal.        Behavior: Behavior normal.     Results for orders placed or performed in visit on 09/12/19 (from the past 72 hour(s))  POCT Wet Prep [facility])     Status: Abnormal   Collection Time: 09/12/19  3:15 PM  Result Value Ref Range   Source Wet Prep POC VAG    WBC, Wet Prep HPF POC 0-3     Bacteria Wet Prep HPF POC Many (A) Few   Clue Cells Wet Prep HPF POC Many (A) None   Clue Cells Wet Prep Whiff POC Positive Whiff    Yeast Wet Prep HPF POC None None   Trichomonas Wet Prep HPF POC Absent Absent    Assessment/Plan:  Bacterial vaginosis Patient with repeat positive whiff test with clue cells. Negative for trich and yeast. - Metronidazole gel BID for 7 days.   PATIENT EDUCATION PROVIDED: See AVS    Diagnosis and plan along with any newly prescribed medication(s) were discussed in detail with this patient today. The patient verbalized understanding and agreed with the plan. Patient advised if symptoms worsen return to clinic or ER.    Orders Placed This Encounter  Procedures  . POCT Wet Prep [facility])    Meds ordered this encounter  Medications  . DISCONTD: metroNIDAZOLE (METROGEL) 0.75 % vaginal gel 1 Applicatorful  . metroNIDAZOLE (METROGEL) 0.75 % gel    Sig: Apply 1 application topically 2 (two) times daily.    Dispense:  45 g    Refill:  0     Harolyn Rutherford, DO 09/12/2019, 3:20 PM PGY-3 [place]

## 2019-09-12 NOTE — Patient Instructions (Signed)
It was great to meet you today! Thank you for letting me participate in your care!  Today, we discussed your symptoms and I have sent in the appropriate medication. Please take it as directed.  Be well, Harolyn Rutherford, DO PGY-3, [facility]

## 2019-09-13 ENCOUNTER — Telehealth: Payer: Self-pay | Admitting: Family Medicine

## 2019-09-13 MED ORDER — METRONIDAZOLE 0.75 % EX GEL: 1.0000 "application " | Freq: Two times a day (BID) | CUTANEOUS | 0 refills | Status: DC

## 2019-09-13 NOTE — Telephone Encounter (Signed)
This patient was seen yesterday by Dr. Garlan Fillers.  Patient was supposed to have prescription for Metronidazole filled yesterday to [facility] on [address] but it was not sent in.  Can we get this filled for her today asap please, thank you.  [phone] is best number if you need to call.

## 2019-09-14 ENCOUNTER — Encounter: Payer: Self-pay | Admitting: Family Medicine

## 2019-09-14 LAB — CERVICOVAGINAL ANCILLARY ONLY
Chlamydia: NEGATIVE
Neisseria Gonorrhea: NEGATIVE

## 2019-09-14 NOTE — Assessment & Plan Note (Signed)
Patient with repeat positive whiff test with clue cells. Negative for trich and yeast. - Metronidazole gel BID for 7 days.

## 2019-09-14 NOTE — Progress Notes (Signed)
Result letter

## 2019-09-17 ENCOUNTER — Emergency Department (HOSPITAL_COMMUNITY)
Admission: EM | Admit: 2019-09-17 | Discharge: 2019-09-18 | Disposition: A | Discharge status: Home / Self Care | Payer: Medicaid Other | Attending: Emergency Medicine | Admitting: Emergency Medicine

## 2019-09-17 ENCOUNTER — Telehealth: Payer: Self-pay | Admitting: Family Medicine

## 2019-09-17 ENCOUNTER — Other Ambulatory Visit: Payer: Self-pay

## 2019-09-17 ENCOUNTER — Encounter (HOSPITAL_COMMUNITY): Payer: Self-pay | Admitting: Emergency Medicine

## 2019-09-17 DIAGNOSIS — I10 Essential (primary) hypertension: Secondary | ICD-10-CM | POA: Insufficient documentation

## 2019-09-17 DIAGNOSIS — Z79899 Other long term (current) drug therapy: Secondary | ICD-10-CM | POA: Insufficient documentation

## 2019-09-17 DIAGNOSIS — Z881 Allergy status to other antibiotic agents status: Secondary | ICD-10-CM | POA: Diagnosis not present

## 2019-09-17 DIAGNOSIS — N899 Noninflammatory disorder of vagina, unspecified: Secondary | ICD-10-CM | POA: Diagnosis present

## 2019-09-17 DIAGNOSIS — Z87891 Personal history of nicotine dependence: Secondary | ICD-10-CM | POA: Insufficient documentation

## 2019-09-17 DIAGNOSIS — N898 Other specified noninflammatory disorders of vagina: Secondary | ICD-10-CM | POA: Diagnosis not present

## 2019-09-17 NOTE — Telephone Encounter (Signed)
Patient needs results from testing she had done in office last week, thanks.

## 2019-09-17 NOTE — Telephone Encounter (Signed)
Patient informed of negative results. Amy Snyder,CMA

## 2019-09-17 NOTE — ED Triage Notes (Signed)
"  I think I have a rubber in me (vagina)."  She believes it happened on Saturday night.  Has been using gel to try to get it out, causes cramping.

## 2019-09-18 LAB — WET PREP, GENITAL
Clue Cells Wet Prep HPF POC: NONE SEEN
Sperm: NONE SEEN
Trich, Wet Prep: NONE SEEN
WBC, Wet Prep HPF POC: NONE SEEN
Yeast Wet Prep HPF POC: NONE SEEN

## 2019-09-18 MED ORDER — IBUPROFEN 800 MG PO TABS
800.0000 mg | ORAL_TABLET | Freq: Once | ORAL | Status: AC
  Administered 2019-09-18: 800 mg via ORAL
  Filled 2019-09-18: qty 1

## 2019-09-18 NOTE — ED Provider Notes (Signed)
[facility] EMERGENCY DEPARTMENT Provider Note   CSN: [id number] Arrival date & time: 09/17/19  2335     History   Chief Complaint Chief Complaint  Patient presents with  . foreign object in vagina    HPI Amy Snyder is a 28 y.o. female.     HPI Patient presents to the emergency department with concerns there may be a vaginal foreign body.  Patient states that she had sexual intercourse Saturday evening and she states that she started treatment for bacterial vaginosis the same day.  The patient states that her partner stated that he is unsure where the condom is.  The patient states that she has not noticed any worsening or further discharge than from what she was previously experiencing.  Patient states that nothing seems make her condition better or worse.  The patient denies chest pain, shortness of breath, headache,blurred vision, neck pain, fever, cough, weakness, numbness, dizziness, anorexia, edema, abdominal pain, nausea, vomiting, diarrhea, rash, back pain, dysuria, hematemesis, bloody stool, near syncope, or syncope. Past Medical History:  Diagnosis Date  . BV (bacterial vaginosis)   . Endometriosis    per biopsy done 11/2016  . Hypertension     Patient Active Problem List   Diagnosis Date Noted  . Chest pain of uncertain etiology [id number]  . Bartholin cyst 07/13/2019  . Bilateral leg edema 07/13/2019  . Grief associated with loss of fetus 07/03/2019  . Inevitable complete miscarriage without complication [id number]  . Supervision of high risk pregnancy, antepartum 02/14/2019  . History of preterm delivery 02/14/2019  . Chronic hypertension affecting pregnancy 02/14/2019  . Pelvic mass during pregnancy 02/14/2019  . History of cesarean section, classical 02/14/2019  . Endometriosis 12/06/2017  . Abnormal Pap smear of cervix 09/26/2016  . Vaginal discharge 06/01/2016  . Essential hypertension, benign 07/03/2014  . Bacterial vaginosis  01/21/2014  . 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 CESAREAN SECTION  10/21/2009   Classical c/s      OB History    Gravida  2   Para  1   Term      Preterm  1   AB  1   Living  1     SAB  1   TAB      Ectopic      Multiple      Live Births  1            Home Medications    Prior to Admission medications   Medication Sig Start Date End Date Taking? Authorizing Provider  acetaminophen (TYLENOL) 500 MG tablet Take 1,000 mg by mouth every 6 (six) hours as needed for mild pain.    [provider]  amLODipine (NORVASC) 10 MG tablet Take 1 tablet (10 mg total) by mouth daily. 07/02/19   Benay Pike, MD  fluconazole (DIFLUCAN) 150 MG tablet TAKE 1 TABLET BY MOUTH AS ONE DOSE 07/13/19   Shelly Bombard, MD  ibuprofen (ADVIL,MOTRIN) 600 MG tablet Take 1 tablet (600 mg total) by mouth every 6 (six) hours as needed. 03/26/19   Anyanwu, Sallyanne Havers, MD  metroNIDAZOLE (METROGEL) 0.75 % gel Apply 1 application topically 2 (two) times daily. 09/13/19   Nuala Alpha, DO  Misc. Devices (SITZ BATH) MISC 1 each by Does not apply route daily. 07/13/19   Daisy Floro, DO    Family History Family History  Problem Relation Age of Onset  .  Hypertension Mother   . Diabetes Other   . Diabetes Maternal Grandfather     Social History Social History   Tobacco Use  . Smoking status: Former Smoker    Packs/day: 0.20    Years: 3.00    Pack years: 0.60    Types: Cigarettes  . Smokeless tobacco: Never Used  . Tobacco comment: smokes socially  Substance Use Topics  . Alcohol use: No    Alcohol/week: 0.0 standard drinks    Comment: socially  . Drug use: No     Allergies   Flagyl [metronidazole]   Review of Systems Review of Systems All other systems negative except as documented in the HPI. All pertinent positives and negatives as reviewed in the HPI.  Physical Exam Updated Vital Signs BP (!)  142/91 (BP Location: Right Arm)   Pulse (!) 57   Temp 97.9 F (36.6 C) (Oral)   Resp 16   SpO2 100%   Physical Exam Vitals signs and nursing note reviewed.  Constitutional:      General: She is not in acute distress.    Appearance: She is well-developed.  HENT:     Head: Normocephalic and atraumatic.  Eyes:     Pupils: Pupils are equal, round, and reactive to light.  Neck:     Musculoskeletal: Normal range of motion and neck supple.  Cardiovascular:     Rate and Rhythm: Normal rate and regular rhythm.     Heart sounds: Normal heart sounds. No murmur. No friction rub. No gallop.   Pulmonary:     Effort: Pulmonary effort is normal. No respiratory distress.     Breath sounds: Normal breath sounds. No wheezing.  Abdominal:     General: Bowel sounds are normal. There is no distension.     Palpations: Abdomen is soft.     Tenderness: There is no abdominal tenderness.  Genitourinary:    Labia:        Right: No rash, tenderness or lesion.        Left: No rash, tenderness or lesion.      Cervix: Normal.     Skin:    General: Skin is warm and dry.     Capillary Refill: Capillary refill takes less than 2 seconds.     Findings: No erythema or rash.  Neurological:     Mental Status: She is alert and oriented to person, place, and time.     Motor: No abnormal muscle tone.     Coordination: Coordination normal.  Psychiatric:        Behavior: Behavior normal.      ED Treatments / Results  Labs (all labs ordered are listed, but only abnormal results are displayed) Labs Reviewed  WET PREP, GENITAL  GC/CHLAMYDIA PROBE AMP ([place]) NOT AT [facility]    EKG None  Radiology No results found.  Procedures Procedures (including critical care time)  Medications Ordered in ED Medications  ibuprofen (ADVIL) tablet 800 mg (800 mg Oral Given 09/18/19 0843)     Initial Impression / Assessment and Plan / ED Course  I have reviewed the triage vital signs and the nursing notes.   Pertinent labs & imaging results that were available during my care of the patient were reviewed by me and considered in my medical decision making (see chart for details).       I advised the patient that there is still the possibility of retained foreign body even though I did sweep and visualize the vaginal vault.  Patient understands this and all questions were answered.  Advised her to follow-up with her primary doctor.  Told to return here as needed.  Patient agrees to this plan and all questions were answered. Final Clinical Impressions(s) / ED Diagnoses   Final diagnoses:  None    ED Discharge Orders    None       Dalia Heading, PA-C 09/18/19 [id number]    Quintella Reichert, MD 09/20/19 [phone]

## 2019-09-18 NOTE — Discharge Instructions (Addendum)
Return here as needed.  Follow-up with your doctor for a recheck.  Keep in mind that even though we did direct visualization of the area and manual exam we did not find any foreign body but that does not exclude the possibility.

## 2019-09-19 LAB — GC/CHLAMYDIA PROBE AMP (~~LOC~~) NOT AT ARMC
Chlamydia: NEGATIVE
Molecular Disclaimer: NEGATIVE
Molecular Disclaimer: NORMAL
Neisseria Gonorrhea: NEGATIVE

## 2019-09-25 ENCOUNTER — Ambulatory Visit: Payer: Medicaid Other

## 2019-10-01 ENCOUNTER — Ambulatory Visit (INDEPENDENT_AMBULATORY_CARE_PROVIDER_SITE_OTHER): Payer: Medicaid Other | Admitting: Family Medicine

## 2019-10-01 ENCOUNTER — Other Ambulatory Visit: Payer: Self-pay

## 2019-10-01 VITALS — BP 116/92 | HR 66 | Wt 175.4 lb

## 2019-10-01 DIAGNOSIS — Z3200 Encounter for pregnancy test, result unknown: Secondary | ICD-10-CM | POA: Diagnosis not present

## 2019-10-01 DIAGNOSIS — T192XXD Foreign body in vulva and vagina, subsequent encounter: Secondary | ICD-10-CM

## 2019-10-01 LAB — POCT URINE PREGNANCY: Preg Test, Ur: NEGATIVE

## 2019-10-02 DIAGNOSIS — T192XXD Foreign body in vulva and vagina, subsequent encounter: Secondary | ICD-10-CM | POA: Insufficient documentation

## 2019-10-02 NOTE — Assessment & Plan Note (Signed)
No foreign body visualized or suspect at this time  Patient comes in to "double check "there is not a condom retained in her vagina after a sexual encounter 2 to 3 days prior to her ED visit on September 28.  She says that she had been drinking with a partner and had consensual sex and a few days later noticed that she did not see a condom in a trash can, so she called the partner and asked them if they threw away or flush the condom.  The partner said he did not remember and she tried looking around the room and could not find it so she became suspicious that might be retained in her vagina.  She went to the emergency department and they were unable to locate this.  She comes back in approximately 2 weeks later for recheck.  She has had no increase in discharge, no pelvic pain, no fever, no sensation of foreign body.  "She just wants to be sure.  *Speculum exam performed entirely with CMA Sheri in the room the entire time, there was no condom visualized.  Patient was reassured and given return emergency precautions for signs of systemic infection

## 2019-10-02 NOTE — Progress Notes (Signed)
    Subjective:  Amy Snyder is a 28 y.o. female who presents to the [facility] today with a chief complaint of thinking she might have a condom retained in her vagina after sexual activity.   HPI: Vaginal foreign body, subsequent encounter Patient comes in to "double check "there is not a condom retained in her vagina after a sexual encounter 2 to 3 days prior to her ED visit on September 28.  She says that she had been drinking with a partner and had consensual sex and a few days later noticed that she did not see a condom in a trash can, so she called the partner and asked them if they threw away or flush the condom.  The partner said he did not remember and she tried looking around the room and could not find it so she became suspicious that might be retained in her vagina.  She went to the emergency department and they were unable to locate this.  She comes back in approximately 2 weeks later for recheck.  She has had no increase in discharge, no pelvic pain, no fever, no sensation of foreign body.  "She just wants to be sure.  Objective:  Physical Exam: BP (!) 116/92   Pulse 66   Wt 175 lb 6.4 oz (79.6 kg)   LMP 09/05/2019 (Exact Date)   SpO2 96%   BMI 27.47 kg/m   Gen: NAD, pleasant and conversing comfortably CV: Regular heart rate Pulm: No work of breathing, regular respiratory rate GI:  Soft, Nontender, Nondistended. *Speculum exam performed entirely with CMA Sheri in the room the entire time, no pathologic lesions visualized externally, scant discharge, no bleeding, no abnormal sensitivity, no foreign body visualized. MSK: no edema, cyanosis, or clubbing noted Skin: warm, dry Neuro: grossly normal, moves all extremities Psych: Normal affect and thought content  Results for orders placed or performed in visit on 10/01/19 (from the past 72 hour(s))  POCT urine pregnancy     Status: None   Collection Time: 10/01/19 11:35 AM  Result Value Ref Range   Preg Test, Ur Negative Negative      Assessment/Plan:  Vaginal foreign body, subsequent encounter No foreign body visualized or suspect at this time  Patient comes in to "double check "there is not a condom retained in her vagina after a sexual encounter 2 to 3 days prior to her ED visit on September 28.  She says that she had been drinking with a partner and had consensual sex and a few days later noticed that she did not see a condom in a trash can, so she called the partner and asked them if they threw away or flush the condom.  The partner said he did not remember and she tried looking around the room and could not find it so she became suspicious that might be retained in her vagina.  She went to the emergency department and they were unable to locate this.  She comes back in approximately 2 weeks later for recheck.  She has had no increase in discharge, no pelvic pain, no fever, no sensation of foreign body.  "She just wants to be sure.  *Speculum exam performed entirely with CMA Sheri in the room the entire time, there was no condom visualized.  Patient was reassured and given return emergency precautions for signs of systemic infection   Sherene Sires, [place] - PGY3 10/02/2019 10:20 AM

## 2019-10-03 ENCOUNTER — Other Ambulatory Visit: Payer: Self-pay | Admitting: Family Medicine

## 2019-10-03 NOTE — Telephone Encounter (Signed)
Pt calling to request refill of: metronidazole (metrogel) Gel  Name of Medication(s):  metronidazole (metrogel) Gel Last date of OV:  10/01/2019 Pharmacy:  [facility]  Will route refill request to Clinic RN.  Discussed with patient policy to call pharmacy for future refills.  Also, discussed refills may take up to 48 hours to approve or deny.  Amy Snyder

## 2019-10-04 MED ORDER — METRONIDAZOLE 0.75 % EX GEL: 1.0000 "application " | Freq: Two times a day (BID) | CUTANEOUS | 0 refills | Status: DC

## 2019-10-31 ENCOUNTER — Other Ambulatory Visit: Payer: Self-pay

## 2019-10-31 ENCOUNTER — Other Ambulatory Visit (HOSPITAL_COMMUNITY)
Admission: RE | Admit: 2019-10-31 | Discharge: 2019-10-31 | Disposition: A | Discharge status: Home / Self Care | Payer: Medicaid Other | Source: Ambulatory Visit | Attending: Obstetrics | Admitting: Obstetrics

## 2019-10-31 ENCOUNTER — Encounter: Payer: Self-pay | Admitting: Obstetrics

## 2019-10-31 ENCOUNTER — Ambulatory Visit: Payer: Medicaid Other | Admitting: Obstetrics

## 2019-10-31 VITALS — BP 129/81 | HR 70 | Ht 67.0 in | Wt 177.0 lb

## 2019-10-31 DIAGNOSIS — Z124 Encounter for screening for malignant neoplasm of cervix: Secondary | ICD-10-CM | POA: Insufficient documentation

## 2019-10-31 DIAGNOSIS — B3731 Acute candidiasis of vulva and vagina: Secondary | ICD-10-CM

## 2019-10-31 DIAGNOSIS — Z0000 Encounter for general adult medical examination without abnormal findings: Secondary | ICD-10-CM | POA: Diagnosis not present

## 2019-10-31 DIAGNOSIS — Z01419 Encounter for gynecological examination (general) (routine) without abnormal findings: Secondary | ICD-10-CM

## 2019-10-31 DIAGNOSIS — Z113 Encounter for screening for infections with a predominantly sexual mode of transmission: Secondary | ICD-10-CM | POA: Diagnosis not present

## 2019-10-31 DIAGNOSIS — N898 Other specified noninflammatory disorders of vagina: Secondary | ICD-10-CM

## 2019-10-31 DIAGNOSIS — B373 Candidiasis of vulva and vagina: Secondary | ICD-10-CM

## 2019-10-31 DIAGNOSIS — F17200 Nicotine dependence, unspecified, uncomplicated: Secondary | ICD-10-CM

## 2019-10-31 DIAGNOSIS — R350 Frequency of micturition: Secondary | ICD-10-CM

## 2019-10-31 LAB — POCT URINALYSIS DIPSTICK
Bilirubin, UA: NEGATIVE
Glucose, UA: NEGATIVE
Ketones, UA: NEGATIVE
Leukocytes, UA: NEGATIVE
Nitrite, UA: NEGATIVE
Protein, UA: POSITIVE — AB
Spec Grav, UA: 1.025 (ref 1.010–1.025)
Urobilinogen, UA: 0.2 E.U./dL
pH, UA: 6 (ref 5.0–8.0)

## 2019-10-31 MED ORDER — NITROFURANTOIN MONOHYD MACRO 100 MG PO CAPS: 100.0000 mg | ORAL_CAPSULE | Freq: Two times a day (BID) | ORAL | 2 refills | Status: DC

## 2019-10-31 MED ORDER — FLUCONAZOLE 150 MG PO TABS: ORAL_TABLET | ORAL | 2 refills | Status: DC

## 2019-10-31 MED ORDER — METRONIDAZOLE 0.75 % EX GEL: 1.0000 "application " | Freq: Two times a day (BID) | CUTANEOUS | 2 refills | Status: DC

## 2019-10-31 NOTE — Progress Notes (Signed)
Patient presents for Annual Exam today.  LMP:10/05/19 Last pap:01/31/18 WNL  Contraception: None  STD Screening: Desires  Denies Family Hx of Breast Cancer.  CC: possible UTI pt notes frequent urination onset Saturday/Sunday denies any blood in urine, no burning just a little pressure.' Pt also notes vaginal discharge and vaginal odor.

## 2019-10-31 NOTE — Progress Notes (Signed)
Subjective:        Amy Snyder is a 28 y.o. female here for a routine exam.  Current complaints: Urinary Frequency.   Malodorous vaginal discharge.  Personal health questionnaire:  Is patient [name], have a family history of breast and/or ovarian cancer: no Is there a family history of uterine cancer diagnosed at age < 36, gastrointestinal cancer, urinary tract cancer, family member who is a Field seismologist syndrome-associated carrier: no Is the patient overweight and hypertensive, family history of diabetes, personal history of gestational diabetes, preeclampsia or PCOS: yes Is patient over 35, have PCOS,  family history of premature CHD under age 67, diabetes, smoke, have hypertension or peripheral artery disease:  yes At any time, has a partner hit, kicked or otherwise hurt or frightened you?: no Over the past 2 weeks, have you felt down, depressed or hopeless?: no Over the past 2 weeks, have you felt little interest or pleasure in doing things?:no   Gynecologic History Patient's last menstrual period was 10/05/2019 (exact date). Contraception: none Last Pap: 01-31-2018. Results were: normal Last mammogram: n/a. Results were: n/a  Obstetric History OB History  Gravida Para Term Preterm AB Living  2 1   1 1 1   SAB TAB Ectopic Multiple Live Births  1       1    # Outcome Date GA Lbr Len/2nd Weight Sex Delivery Anes PTL Lv  2 SAB 03/26/19 [redacted]w[redacted]d         1 Preterm 10/21/09 [redacted]w[redacted]d  2 lb 4 oz (1.021 kg) M CS-Unspec EPI  LIV     Birth Comments: [name]    Past Medical History:  Diagnosis Date  . BV (bacterial vaginosis)   . Endometriosis    per biopsy done 11/2016  . Hypertension     Past Surgical History:  Procedure Laterality Date  . CESAREAN SECTION  10/21/2009   Classical c/s      Current Outpatient Medications:  .  amLODipine (NORVASC) 10 MG tablet, Take 1 tablet (10 mg total) by mouth daily., Disp: 30 tablet, Rfl: 2 .  acetaminophen (TYLENOL) 500 MG tablet, Take  1,000 mg by mouth every 6 (six) hours as needed for mild pain., Disp: , Rfl:  .  fluconazole (DIFLUCAN) 150 MG tablet, TAKE 1 TABLET BY MOUTH AS ONE DOSE, Disp: 1 tablet, Rfl: 2 .  ibuprofen (ADVIL,MOTRIN) 600 MG tablet, Take 1 tablet (600 mg total) by mouth every 6 (six) hours as needed. (Patient not taking: Reported on 10/31/2019), Disp: 60 tablet, Rfl: 3 .  metroNIDAZOLE (METROGEL) 0.75 % gel, Apply 1 application topically 2 (two) times daily., Disp: 45 g, Rfl: 2 .  Misc. Devices (SITZ BATH) MISC, 1 each by Does not apply route daily. (Patient not taking: Reported on 10/31/2019), Disp: 1 each, Rfl: 0 .  nitrofurantoin, macrocrystal-monohydrate, (MACROBID) 100 MG capsule, Take 1 capsule (100 mg total) by mouth 2 (two) times daily. 1 po BID x 7days, Disp: 14 capsule, Rfl: 2 Allergies  Allergen Reactions  . Flagyl [Metronidazole] Itching, Swelling and Rash    Social History   Tobacco Use  . Smoking status: Light Tobacco Smoker    Packs/day: 0.20    Years: 3.00    Pack years: 0.60    Types: Cigarettes  . Smokeless tobacco: Never Used  . Tobacco comment: smokes socially  Substance Use Topics  . Alcohol use: Yes    Alcohol/week: 0.0 standard drinks    Comment: socially    Family History  Problem Relation  Age of Onset  . Hypertension Mother   . Diabetes Other   . Diabetes Maternal Grandfather       Review of Systems  Constitutional: negative for fatigue and weight loss Respiratory: negative for cough and wheezing Cardiovascular: negative for chest pain, fatigue and palpitations Gastrointestinal: negative for abdominal pain and change in bowel habits Musculoskeletal:negative for myalgias Neurological: negative for gait problems and tremors Behavioral/Psych: negative for abusive relationship, depression Endocrine: negative for temperature intolerance    Genitourinary:negative for abnormal menstrual periods, genital lesions, hot flashes, sexual problems and vaginal  discharge Integument/breast: negative for breast lump, breast tenderness, nipple discharge and skin lesion(s)    Objective:       BP 129/81   Pulse 70   Ht 5\' 7"  (1.702 m)   Wt 177 lb (80.3 kg)   LMP 10/05/2019 (Exact Date)   BMI 27.72 kg/m  General:   alert  Skin:   no rash or abnormalities  Lungs:   clear to auscultation bilaterally  Heart:   regular rate and rhythm, S1, S2 normal, no murmur, click, rub or gallop  Breasts:   normal without suspicious masses, skin or nipple changes or axillary nodes  Abdomen:  normal findings: no organomegaly, soft, non-tender and no hernia  Pelvis:  External genitalia: normal general appearance Urinary system: urethral meatus normal and bladder without fullness, nontender Vaginal: normal without tenderness, induration or masses Cervix: normal appearance Adnexa: normal bimanual exam Uterus: anteverted and non-tender, normal size   Lab Review Urine pregnancy test Labs reviewed yes Radiologic studies reviewed no  50% of 25 min visit spent on counseling and coordination of care.   Assessment:     1. Encounter for annual routine gynecological examination  2. Screening for cervical cancer Rx: - Cytology - PAP( [place])  3. Vaginal discharge Rx: - Cervicovaginal ancillary only( [place]) - metroNIDAZOLE (METROGEL) 0.75 % gel; Apply 1 application topically 2 (two) times daily.  Dispense: 45 g; Refill: 2  4. Screening examination for STD (sexually transmitted disease) Rx: - Hepatitis B surface antigen - Hepatitis C antibody - HIV antibody - RPR  5. Candida vaginitis Rx: - fluconazole (DIFLUCAN) 150 MG tablet; TAKE 1 TABLET BY MOUTH AS ONE DOSE  Dispense: 1 tablet; Refill: 2  6. Frequent urination Rx: - Urine Culture-GYN - POCT Urinalysis Dipstick - nitrofurantoin, macrocrystal-monohydrate, (MACROBID) 100 MG capsule; Take 1 capsule (100 mg total) by mouth 2 (two) times daily. 1 po BID x 7days  Dispense: 14 capsule; Refill:  2  7. Tobacco dependence - smoking cessation recommended with medication and behavioral modification    Plan:    Education reviewed: calcium supplements, depression evaluation, low fat, low cholesterol diet, safe sex/STD prevention, self breast exams, smoking cessation and weight bearing exercise. Contraception: condoms. Follow up in: 1 year.   Meds ordered this encounter  Medications  . nitrofurantoin, macrocrystal-monohydrate, (MACROBID) 100 MG capsule    Sig: Take 1 capsule (100 mg total) by mouth 2 (two) times daily. 1 po BID x 7days    Dispense:  14 capsule    Refill:  2  . fluconazole (DIFLUCAN) 150 MG tablet    Sig: TAKE 1 TABLET BY MOUTH AS ONE DOSE    Dispense:  1 tablet    Refill:  2  . metroNIDAZOLE (METROGEL) 0.75 % gel    Sig: Apply 1 application topically 2 (two) times daily.    Dispense:  45 g    Refill:  2   Orders Placed This  Encounter  Procedures  . Urine Culture-GYN  . Hepatitis B surface antigen  . Hepatitis C antibody  . HIV antibody  . RPR  . POCT Urinalysis Dipstick     Shelly Bombard, MD 10/31/2019 11:16 AM

## 2019-11-01 LAB — CYTOLOGY - PAP: Diagnosis: NEGATIVE

## 2019-11-01 LAB — CERVICOVAGINAL ANCILLARY ONLY
Bacterial Vaginitis (gardnerella): POSITIVE — AB
Candida Glabrata: NEGATIVE
Candida Vaginitis: NEGATIVE
Chlamydia: NEGATIVE
Comment: NEGATIVE
Comment: NEGATIVE
Comment: NEGATIVE
Comment: NEGATIVE
Comment: NEGATIVE
Comment: NORMAL
Neisseria Gonorrhea: NEGATIVE
Trichomonas: NEGATIVE

## 2019-11-01 LAB — RPR: RPR Ser Ql: NONREACTIVE

## 2019-11-01 LAB — HIV ANTIBODY (ROUTINE TESTING W REFLEX): HIV Screen 4th Generation wRfx: NONREACTIVE

## 2019-11-01 LAB — HEPATITIS B SURFACE ANTIGEN: Hepatitis B Surface Ag: NEGATIVE

## 2019-11-01 LAB — HEPATITIS C ANTIBODY: Hep C Virus Ab: <0.1 s/co ratio (ref 0.0–0.9)

## 2019-11-02 ENCOUNTER — Other Ambulatory Visit: Payer: Self-pay | Admitting: Obstetrics

## 2019-11-02 LAB — URINE CULTURE

## 2019-11-05 DIAGNOSIS — Z20828 Contact with and (suspected) exposure to other viral communicable diseases: Secondary | ICD-10-CM | POA: Diagnosis not present

## 2019-11-12 ENCOUNTER — Other Ambulatory Visit: Payer: Self-pay | Admitting: *Deleted

## 2019-11-12 MED ORDER — AMLODIPINE BESYLATE 10 MG PO TABS: 10.0000 mg | ORAL_TABLET | Freq: Every day | ORAL | 2 refills | Status: DC

## 2020-02-19 ENCOUNTER — Other Ambulatory Visit (HOSPITAL_COMMUNITY)
Admission: RE | Admit: 2020-02-19 | Discharge: 2020-02-19 | Disposition: A | Discharge status: Home / Self Care | Payer: Medicaid Other | Source: Ambulatory Visit | Attending: Family Medicine | Admitting: Family Medicine

## 2020-02-19 ENCOUNTER — Other Ambulatory Visit: Payer: Self-pay

## 2020-02-19 ENCOUNTER — Ambulatory Visit (INDEPENDENT_AMBULATORY_CARE_PROVIDER_SITE_OTHER): Payer: Medicaid Other | Admitting: Family Medicine

## 2020-02-19 VITALS — BP 126/75 | HR 74 | Wt 182.0 lb

## 2020-02-19 DIAGNOSIS — Z113 Encounter for screening for infections with a predominantly sexual mode of transmission: Secondary | ICD-10-CM | POA: Insufficient documentation

## 2020-02-19 DIAGNOSIS — Z7251 High risk heterosexual behavior: Secondary | ICD-10-CM

## 2020-02-19 DIAGNOSIS — Z9189 Other specified personal risk factors, not elsewhere classified: Secondary | ICD-10-CM

## 2020-02-19 HISTORY — DX: Other specified personal risk factors, not elsewhere classified: Z91.89

## 2020-02-19 LAB — POCT URINE PREGNANCY: Preg Test, Ur: NEGATIVE

## 2020-02-19 NOTE — Progress Notes (Signed)
   Subjective:   [name]    DOB: [birth year], 29 y.o. female   MRN: [id number]  Amy Snyder is a 29 y.o. female here for STD testing.  STD Testing  Unprotected Sex: Patient notes she had unprotected sex on 2/14. This was a semi new partner as it had been a while since she was with this new partner. She notes today she started having clear discharge but thinks it may be because she is about to start her period. Denies any vaginal itching or dysuria. Has a history of chlamydia/gonorrhea when she was younger. LMP 01/24/20, she notes about to start her period. Declines desire for birth control.  Review of Systems:  Per HPI.   [place], medications and smoking status reviewed.  Objective:   BP 126/75   Pulse 74   Wt 182 lb (82.6 kg)   LMP 01/24/2020 (Exact Date)   SpO2 100%   BMI 28.51 kg/m  Vitals and nursing note reviewed.  General: well nourished, well developed, in no acute distress with non-toxic appearance Resp: Speaking in full sentences, breathing comfortably on room air Skin: warm, dry, no rashes or lesions Extremities: warm and well perfused MSK:  gait normal Neuro: Alert and oriented, speech normal Pelvic exam: VULVA: normal appearing vulva with no masses, tenderness or lesions, VAGINA: normal appearing vagina with normal color and discharge, no lesions, CERVIX: cervical discharge present - creamy white discharge, some scant blood at cervical os Exam chaperoned by: Leonia Corona.  Assessment & Plan:   At risk for sexually transmitted disease due to unprotected sex History of unprotected sex on 2/14. Currently asymptomatic except for some mild clear discharge today. Does have a history of STDs. She notes she often gets STD testing which is unprotected sex. Declines birth control today. -GC/CL, trichomonas, HIV, RPR obtained today -Urine pregnancy test obtained and negative -Will call with results and treat if indicated  Orders Placed This Encounter   Procedures  . RPR  . HIV Antibody (routine testing w rflx)  . POCT urine pregnancy   Mina Marble, DO PGY-2, [facility] 02/19/2020 8:12 PM

## 2020-02-19 NOTE — Patient Instructions (Signed)
Thank you for coming to see me today. It was a pleasure to see you.   Today we obtained STD screening. I will call you with the results.  If you have any questions or concerns, please do not hesitate to call the office at [phone].  Take Care,   Dr. Mina Marble, DO Resident Physician [place] [phone]

## 2020-02-19 NOTE — Assessment & Plan Note (Addendum)
History of unprotected sex on 2/14. Currently asymptomatic except for some mild clear discharge today. Does have a history of STDs. She notes she often gets STD testing which is unprotected sex. Declines birth control today. -GC/CL, trichomonas, HIV, RPR obtained today -Urine pregnancy test obtained and negative -Will call with results and treat if indicated

## 2020-02-20 ENCOUNTER — Telehealth: Payer: Self-pay

## 2020-02-20 LAB — RPR: RPR Ser Ql: NONREACTIVE

## 2020-02-20 LAB — HIV ANTIBODY (ROUTINE TESTING W REFLEX): HIV Screen 4th Generation wRfx: NONREACTIVE

## 2020-02-20 NOTE — Telephone Encounter (Signed)
Called and informed patient of results.  Amy Snyder, [name]

## 2020-02-21 ENCOUNTER — Encounter: Payer: Self-pay | Admitting: Family Medicine

## 2020-02-21 LAB — CERVICOVAGINAL ANCILLARY ONLY
Chlamydia: NEGATIVE
Comment: NEGATIVE
Comment: NEGATIVE
Comment: NORMAL
Neisseria Gonorrhea: NEGATIVE
Trichomonas: NEGATIVE

## 2020-03-10 ENCOUNTER — Other Ambulatory Visit: Payer: Self-pay | Admitting: Family Medicine

## 2020-03-10 ENCOUNTER — Encounter: Payer: Self-pay | Admitting: Family Medicine

## 2020-03-10 ENCOUNTER — Ambulatory Visit (INDEPENDENT_AMBULATORY_CARE_PROVIDER_SITE_OTHER): Payer: Medicaid Other | Admitting: Family Medicine

## 2020-03-10 ENCOUNTER — Other Ambulatory Visit: Payer: Self-pay

## 2020-03-10 VITALS — BP 114/80 | HR 62 | Ht 67.0 in | Wt 189.0 lb

## 2020-03-10 DIAGNOSIS — I10 Essential (primary) hypertension: Secondary | ICD-10-CM | POA: Diagnosis not present

## 2020-03-10 DIAGNOSIS — Z9189 Other specified personal risk factors, not elsewhere classified: Secondary | ICD-10-CM

## 2020-03-10 NOTE — Patient Instructions (Signed)
Thank you for coming to see me today. It was a pleasure! Today we talked about:   I will release your results on MyChart and fax your paperwork. Good luck on your surgery!  Please follow-up with me in 6 months or sooner as needed.  If you have any questions or concerns, please do not hesitate to call the office at [phone].  Take Care,   [place] Zyheir Daft, DO

## 2020-03-10 NOTE — Progress Notes (Signed)
   SUBJECTIVE:   CHIEF COMPLAINT / HPI:   Surgical clearance: Patient is here for surgical clearance in order for her to have a procedure done in [place] in April.  Patient has a list of labs that she needs completed as well as an H&P that she will also need completed.  Patient reports that she has only ever had a C-section.  She does have a history of hypertension which is well controlled on her current medications.  She denies any history of diabetes.  Patient with no family history of adverse effects to anesthesia that she is aware of.  Patient previously a smoker but quit on 12/21/2019.  She reports socially drinking on some weekends where she will have up to only 2 shots of liquor.  PERTINENT  PMH / PSH: HTN  OBJECTIVE:  BP 114/80   Pulse 62   Ht 5\' 7"  (1.702 m)   Wt 189 lb (85.7 kg)   LMP 02/20/2020   SpO2 99%   BMI 29.60 kg/m   General: NAD, pleasant Neck: Supple, no LAD Cardiovascular: RRR, no m/r/g, no LE edema Respiratory: CTA BL, normal work of breathing Neuro: CN II-XII grossly intact Psych: AOx3, appropriate affect  ASSESSMENT/PLAN:   Essential hypertension, benign BP well controlled at 114/80.  Continue current medication amlodipine 5 mg.  Encounter for surgical clearance Per ACS calculator patient is considered low risk for surgery.  Obtained blood work including beta-hCG, CBC, CMP, clinical hemoglobin, HIV, PT/INR, TSH, free T4, free T3 along with EKG today which was NSR. Paperwork has been completed and will be left at the front office to be faxed.  [place] Yahaira Bruski, DO PGY-3, [facility]

## 2020-03-11 LAB — CBC
Hematocrit: 39.5 % (ref 34.0–46.6)
Hemoglobin: 12.8 g/dL (ref 11.1–15.9)
MCH: 26.6 pg (ref 26.6–33.0)
MCHC: 32.4 g/dL (ref 31.5–35.7)
MCV: 82 fL (ref 79–97)
Platelets: 283 10*3/uL (ref 150–450)
RBC: 4.82 x10E6/uL (ref 3.77–5.28)
RDW: 16.6 % — ABNORMAL HIGH (ref 11.7–15.4)
WBC: 6.9 10*3/uL (ref 3.4–10.8)

## 2020-03-11 LAB — T3, FREE: T3, Free: 3 pg/mL (ref 2.0–4.4)

## 2020-03-11 LAB — COMPREHENSIVE METABOLIC PANEL
ALT: 13 IU/L (ref 0–32)
AST: 20 IU/L (ref 0–40)
Albumin/Globulin Ratio: 1.8 (ref 1.2–2.2)
Albumin: 4.6 g/dL (ref 3.9–5.0)
Alkaline Phosphatase: 86 IU/L (ref 39–117)
BUN/Creatinine Ratio: 12 (ref 9–23)
BUN: 7 mg/dL (ref 6–20)
Bilirubin Total: <0.2 mg/dL (ref 0.0–1.2)
CO2: 21 mmol/L (ref 20–29)
Calcium: 9.2 mg/dL (ref 8.7–10.2)
Chloride: 104 mmol/L (ref 96–106)
Creatinine, Ser: 0.58 mg/dL (ref 0.57–1.00)
GFR calc Af Amer: 144 mL/min/{1.73_m2} (ref >59)
GFR calc non Af Amer: 125 mL/min/{1.73_m2} (ref >59)
Globulin, Total: 2.6 g/dL (ref 1.5–4.5)
Glucose: 84 mg/dL (ref 65–99)
Potassium: 4.3 mmol/L (ref 3.5–5.2)
Sodium: 139 mmol/L (ref 134–144)
Total Protein: 7.2 g/dL (ref 6.0–8.5)

## 2020-03-11 LAB — PROTIME-INR
INR: 1 (ref 0.9–1.2)
Prothrombin Time: 10.4 s (ref 9.1–12.0)

## 2020-03-11 LAB — TSH+FREE T4
Free T4: 1.23 ng/dL (ref 0.82–1.77)
TSH: 1.02 u[IU]/mL (ref 0.450–4.500)

## 2020-03-11 LAB — HIV ANTIBODY (ROUTINE TESTING W REFLEX): HIV Screen 4th Generation wRfx: NONREACTIVE

## 2020-03-11 LAB — BETA HCG QUANT (REF LAB): hCG Quant: <1 m[IU]/mL

## 2020-03-12 ENCOUNTER — Telehealth: Payer: Self-pay | Admitting: Family Medicine

## 2020-03-12 LAB — HGB A1C W/O EAG: Hgb A1c MFr Bld: 5.4 % (ref 4.8–5.6)

## 2020-03-12 LAB — SPECIMEN STATUS REPORT

## 2020-03-12 NOTE — Telephone Encounter (Signed)
Pt is calling to leave fax # for the forms to be faxed to. [phone]

## 2020-03-12 NOTE — Telephone Encounter (Signed)
Forms filled out and given to Penelope Galas on 03/24 for medical records and labs to be attached and faxed.

## 2020-03-12 NOTE — Telephone Encounter (Signed)
Will forward to MD. Amy Snyder,CMA  

## 2020-03-13 ENCOUNTER — Other Ambulatory Visit: Payer: Self-pay

## 2020-03-13 MED ORDER — AMLODIPINE BESYLATE 10 MG PO TABS: 10.0000 mg | ORAL_TABLET | Freq: Every day | ORAL | 2 refills | Status: DC

## 2020-03-13 NOTE — Assessment & Plan Note (Signed)
BP well controlled at 114/80.  Continue current medication amlodipine 5 mg.

## 2020-03-24 NOTE — Telephone Encounter (Signed)
Patient LVM on nurse line requesting update on forms. Per Kennyth Lose, these forms have been faxed multiple times by her and [place]. Per Jerene Pitch, these forms have been faxed multiple times to multiple numbers provided. The patient gave me another fax number [phone]. Fax number given to [facility] who will fax again.

## 2020-03-27 ENCOUNTER — Ambulatory Visit (INDEPENDENT_AMBULATORY_CARE_PROVIDER_SITE_OTHER): Payer: Medicaid Other

## 2020-03-27 ENCOUNTER — Other Ambulatory Visit: Payer: Self-pay

## 2020-03-27 ENCOUNTER — Ambulatory Visit (HOSPITAL_COMMUNITY)
Admission: RE | Admit: 2020-03-27 | Discharge: 2020-03-27 | Disposition: A | Discharge status: Home / Self Care | Payer: Medicaid Other | Source: Ambulatory Visit | Attending: Family Medicine | Admitting: Family Medicine

## 2020-03-27 DIAGNOSIS — Z01810 Encounter for preprocedural cardiovascular examination: Secondary | ICD-10-CM | POA: Diagnosis not present

## 2020-03-27 NOTE — Progress Notes (Signed)
Patient reports to nurse clinic for pre-op EKG. EKG completed, no changes noted per Dr. Owens Shark.   Routing to PCP  Talbot Grumbling, RN

## 2020-03-28 ENCOUNTER — Other Ambulatory Visit: Payer: Medicaid Other

## 2020-03-28 ENCOUNTER — Other Ambulatory Visit: Payer: Self-pay

## 2020-03-28 DIAGNOSIS — Z01810 Encounter for preprocedural cardiovascular examination: Secondary | ICD-10-CM

## 2020-03-29 LAB — APTT: aPTT: 27 s (ref 24–33)

## 2020-04-09 ENCOUNTER — Ambulatory Visit (INDEPENDENT_AMBULATORY_CARE_PROVIDER_SITE_OTHER): Payer: Medicaid Other | Admitting: Family Medicine

## 2020-04-09 ENCOUNTER — Encounter: Payer: Self-pay | Admitting: Family Medicine

## 2020-04-09 ENCOUNTER — Other Ambulatory Visit: Payer: Self-pay

## 2020-04-09 DIAGNOSIS — I10 Essential (primary) hypertension: Secondary | ICD-10-CM | POA: Diagnosis not present

## 2020-04-09 MED ORDER — HYDROCHLOROTHIAZIDE 12.5 MG PO TABS: 12.5000 mg | ORAL_TABLET | Freq: Every day | ORAL | 3 refills | Status: DC

## 2020-04-09 NOTE — Progress Notes (Signed)
   SUBJECTIVE:   CHIEF COMPLAINT / HPI:   Hypertension: - Patient was unable to have elective surgery this past month due to her blood pressure being elevated during the visits. She states the last day it was 149/99. She reports intermittent leg swelling. - Medications: amlodipine 10mg  - Compliance: takes most days, occasionally misses on the weekends - Checking BP at home: no - Denies any SOB, CP, vision changes, medication SEs, or symptoms of hypotension - Diet: high in sodium- counseled on decreasing - Exercise: does not exercise regularly, counseled  PERTINENT  PMH / PSH: HTN  OBJECTIVE:  BP 132/84   Pulse 74   Wt 189 lb (85.7 kg)   LMP 03/09/2020 (Approximate)   BMI 29.60 kg/m   General: NAD, pleasant Neck: Supple, no LAD Cardiovascular: no LE edema Respiratory: normal work of breathing Neuro: CN II-XII grossly intact Psych: AOx3, appropriate affect  ASSESSMENT/PLAN:   Essential hypertension, benign Patient with elevated BP x2 and unable to get surgery. It is 132/84 today with no sx. Will attempt tighter control so she may have her surgery. Patient previously has been on HCTZ, and coreg.  - initiate HCTZ 12.5mg  now which may help with intermittent swelling and improve blood pressure - continue amlodipine and switch to nightly dosing (may need to discontinue in future if leg swelling continues or becomes more frequent) - return in 1 week for BP check or sooner if she develops any sx of CP, SOB, or sx of low BP    [place] Heloise Gordan, DO PGY-3, [place]

## 2020-04-09 NOTE — Patient Instructions (Signed)
Thank you for coming to see me today. It was a pleasure! Today we talked about:   Please try to exercise 150 minutes/week with a brisk walk or bike ride. You can split this up into 30 minutes for 5 days or 50 minutes or 3 days.   I have also started you on HCTZ to take daily.   Please follow-up with me in 1 week or sooner as needed.  If you have any questions or concerns, please do not hesitate to call the office at [phone].  Take Care,   [place] Zoila Ditullio, DO  Low-Sodium Eating Plan Sodium, which is an element that makes up salt, helps you maintain a healthy balance of fluids in your body. Too much sodium can increase your blood pressure and cause fluid and waste to be held in your body. Your health care provider or dietitian may recommend following this plan if you have high blood pressure (hypertension), kidney disease, liver disease, or heart failure. Eating less sodium can help lower your blood pressure, reduce swelling, and protect your heart, liver, and kidneys. What are tips for following this plan? General guidelines  Most people on this plan should limit their sodium intake to 1,500-2,000 mg (milligrams) of sodium each day. Reading food labels   The Nutrition Facts label lists the amount of sodium in one serving of the food. If you eat more than one serving, you must multiply the listed amount of sodium by the number of servings.  Choose foods with less than 140 mg of sodium per serving.  Avoid foods with 300 mg of sodium or more per serving. Shopping  Look for lower-sodium products, often labeled as "low-sodium" or "no salt added."  Always check the sodium content even if foods are labeled as "unsalted" or "no salt added".  Buy fresh foods. ? Avoid canned foods and premade or frozen meals. ? Avoid canned, cured, or processed meats  Buy breads that have less than 80 mg of sodium per slice. Cooking  Eat more home-cooked food and less restaurant, buffet, and fast  food.  Avoid adding salt when cooking. Use salt-free seasonings or herbs instead of table salt or sea salt. Check with your health care provider or pharmacist before using salt substitutes.  Cook with plant-based oils, such as canola, sunflower, or olive oil. Meal planning  When eating at a restaurant, ask that your food be prepared with less salt or no salt, if possible.  Avoid foods that contain MSG (monosodium glutamate). MSG is sometimes added to [place] food, bouillon, and some canned foods. What foods are recommended? The items listed may not be a complete list. Talk with your dietitian about what dietary choices are best for you. Grains Low-sodium cereals, including oats, puffed wheat and rice, and shredded wheat. Low-sodium crackers. Unsalted rice. Unsalted pasta. Low-sodium bread. Whole-grain breads and whole-grain pasta. Vegetables Fresh or frozen vegetables. "No salt added" canned vegetables. "No salt added" tomato sauce and paste. Low-sodium or reduced-sodium tomato and vegetable juice. Fruits Fresh, frozen, or canned fruit. Fruit juice. Meats and other protein foods Fresh or frozen (no salt added) meat, poultry, seafood, and fish. Low-sodium canned tuna and salmon. Unsalted nuts. Dried peas, beans, and lentils without added salt. Unsalted canned beans. Eggs. Unsalted nut butters. Dairy Milk. Soy milk. Cheese that is naturally low in sodium, such as ricotta cheese, fresh mozzarella, or Swiss cheese Low-sodium or reduced-sodium cheese. Cream cheese. Yogurt. Fats and oils Unsalted butter. Unsalted margarine with no trans fat. Vegetable oils  such as canola or olive oils. Seasonings and other foods Fresh and dried herbs and spices. Salt-free seasonings. Low-sodium mustard and ketchup. Sodium-free salad dressing. Sodium-free light mayonnaise. Fresh or refrigerated horseradish. Lemon juice. Vinegar. Homemade, reduced-sodium, or low-sodium soups. Unsalted popcorn and pretzels. Low-salt  or salt-free chips. What foods are not recommended? The items listed may not be a complete list. Talk with your dietitian about what dietary choices are best for you. Grains Instant hot cereals. Bread stuffing, pancake, and biscuit mixes. Croutons. Seasoned rice or pasta mixes. Noodle soup cups. Boxed or frozen macaroni and cheese. Regular salted crackers. Self-rising flour. Vegetables Sauerkraut, pickled vegetables, and relishes. Olives. [place] fries. Onion rings. Regular canned vegetables (not low-sodium or reduced-sodium). Regular canned tomato sauce and paste (not low-sodium or reduced-sodium). Regular tomato and vegetable juice (not low-sodium or reduced-sodium). Frozen vegetables in sauces. Meats and other protein foods Meat or fish that is salted, canned, smoked, spiced, or pickled. Bacon, ham, sausage, hotdogs, corned beef, chipped beef, packaged lunch meats, salt pork, jerky, pickled herring, anchovies, regular canned tuna, sardines, salted nuts. Dairy Processed cheese and cheese spreads. Cheese curds. Blue cheese. Feta cheese. String cheese. Regular cottage cheese. Buttermilk. Canned milk. Fats and oils Salted butter. Regular margarine. Ghee. Bacon fat. Seasonings and other foods Onion salt, garlic salt, seasoned salt, table salt, and sea salt. Canned and packaged gravies. Worcestershire sauce. Tartar sauce. Barbecue sauce. Teriyaki sauce. Soy sauce, including reduced-sodium. Steak sauce. Fish sauce. Oyster sauce. Cocktail sauce. Horseradish that you find on the shelf. Regular ketchup and mustard. Meat flavorings and tenderizers. Bouillon cubes. Hot sauce and Tabasco sauce. Premade or packaged marinades. Premade or packaged taco seasonings. Relishes. Regular salad dressings. Salsa. Potato and tortilla chips. Corn chips and puffs. Salted popcorn and pretzels. Canned or dried soups. Pizza. Frozen entrees and pot pies. Summary  Eating less sodium can help lower your blood pressure, reduce  swelling, and protect your heart, liver, and kidneys.  Most people on this plan should limit their sodium intake to 1,500-2,000 mg (milligrams) of sodium each day.  Canned, boxed, and frozen foods are high in sodium. Restaurant foods, fast foods, and pizza are also very high in sodium. You also get sodium by adding salt to food.  Try to cook at home, eat more fresh fruits and vegetables, and eat less fast food, canned, processed, or prepared foods. This information is not intended to replace advice given to you by your health care provider. Make sure you discuss any questions you have with your health care provider. Document Revised: 11/18/2017 Document Reviewed: 11/29/2016 Elsevier Patient Education  2020 [employer].

## 2020-04-09 NOTE — Assessment & Plan Note (Addendum)
Patient with elevated BP x2 and unable to get surgery. It is 132/84 today with no sx. Will attempt tighter control so she may have her surgery. Patient previously has been on HCTZ, and coreg.  - initiate HCTZ 12.5mg  now which may help with intermittent swelling and improve blood pressure - continue amlodipine and switch to nightly dosing (may need to discontinue in future if leg swelling continues or becomes more frequent) - return in 1 week for BP check or sooner if she develops any sx of CP, SOB, or sx of low BP

## 2020-04-16 ENCOUNTER — Other Ambulatory Visit: Payer: Self-pay

## 2020-04-16 ENCOUNTER — Ambulatory Visit (HOSPITAL_COMMUNITY)
Admission: RE | Admit: 2020-04-16 | Discharge: 2020-04-16 | Disposition: A | Discharge status: Home / Self Care | Payer: Medicaid Other | Source: Ambulatory Visit | Attending: Family Medicine | Admitting: Family Medicine

## 2020-04-16 ENCOUNTER — Ambulatory Visit (INDEPENDENT_AMBULATORY_CARE_PROVIDER_SITE_OTHER): Payer: Medicaid Other | Admitting: Family Medicine

## 2020-04-16 ENCOUNTER — Encounter: Payer: Self-pay | Admitting: Family Medicine

## 2020-04-16 VITALS — BP 128/76 | Ht 67.0 in | Wt 189.0 lb

## 2020-04-16 DIAGNOSIS — R9431 Abnormal electrocardiogram [ECG] [EKG]: Secondary | ICD-10-CM | POA: Diagnosis not present

## 2020-04-16 DIAGNOSIS — I10 Essential (primary) hypertension: Secondary | ICD-10-CM | POA: Diagnosis not present

## 2020-04-16 DIAGNOSIS — Z419 Encounter for procedure for purposes other than remedying health state, unspecified: Secondary | ICD-10-CM | POA: Diagnosis not present

## 2020-04-16 MED ORDER — HYDROCHLOROTHIAZIDE 25 MG PO TABS: 25.0000 mg | ORAL_TABLET | Freq: Every day | ORAL | 3 refills | Status: DC

## 2020-04-16 NOTE — Patient Instructions (Signed)
Thank you for coming to see me today. It was a pleasure! Today we talked about:   Please be sure to ask what labs are needed for your surgery exactly. Today we collected a CMP, CBC, PT-INR, PTT.  We have done an EKG but please ask if there is anything else they need for cardiac clearance.  We will increase your hydrochlorothiazide to 25 mg daily.  You may take 2 of the 12.5 mg pills and then I have sent in a new prescription for you can take 1 tablet daily.  Please ask their goal blood pressure to ensure that this change in medication is enough.  Please follow-up with me a few weeks after your surgery or sooner as needed.  If you have any questions or concerns, please do not hesitate to call the office at [phone].  Take Care,   Amy Dekendrick Uzelac, DO

## 2020-04-16 NOTE — Progress Notes (Addendum)
   SUBJECTIVE:   CHIEF COMPLAINT / HPI:  Hypertension: - Patient was unable to have elective surgery this past month due to her blood pressure being elevated during the visits. We restarted HCTZ 12.5 mg and has had no sx of low BP. She reports her intermittent leg swelling has improved. - Medications: amlodipine 10mg , HCTZ 12.5 - Compliance:  yes - Checking BP at home: no - Denies any SOB, CP, vision changes, medication SEs, or symptoms of hypotension - Diet: trying to decrease salt in diet - Exercise: does not exercise regularly, counseled  Surgical clearance: Patient is here for surgical clearance after not being able to previously complete surgery in order for her to have a procedure done in [place] in May.  Patient does not know what labs she needs as they will not tell her. Please see visit from 03/10/2020 for full history.   PERTINENT  PMH / PSH: HTN  OBJECTIVE:  BP 128/76   Ht 5\' 7"  (1.702 m)   Wt 189 lb (85.7 kg)   LMP 04/11/2020 (Exact Date)   BMI 29.60 kg/m   General: NAD, pleasant Neck: Supple, no LAD Respiratory: normal work of breathing Psych: AOx3, appropriate affect  ASSESSMENT/PLAN:   Essential hypertension, benign Increase HCTZ to 25 mg. BMP today with low potassium, will need repeat given increase and may need to supplement.    Encounter for surgical clearance Per ACS calculator patient is considered low risk for surgery. Obtained repeat blood work including CBC, CMP, PT/INR, PTT, and EKG today which was NSR. Patient to call if needs further lab work after gathering what specific labs she needs as patient should not require repeat TFT's. Will likely need to add on beta-hcg or HIV (lab tech says this cannot be add-on if patient does need repeat).  [place] Clementine Soulliere, DO PGY-3, [facility]

## 2020-04-17 LAB — COMPREHENSIVE METABOLIC PANEL
ALT: 12 IU/L (ref 0–32)
AST: 14 IU/L (ref 0–40)
Albumin/Globulin Ratio: 1.8 (ref 1.2–2.2)
Albumin: 4.7 g/dL (ref 3.9–5.0)
Alkaline Phosphatase: 85 IU/L (ref 39–117)
BUN/Creatinine Ratio: 10 (ref 9–23)
BUN: 7 mg/dL (ref 6–20)
Bilirubin Total: <0.2 mg/dL (ref 0.0–1.2)
CO2: 22 mmol/L (ref 20–29)
Calcium: 9.5 mg/dL (ref 8.7–10.2)
Chloride: 102 mmol/L (ref 96–106)
Creatinine, Ser: 0.68 mg/dL (ref 0.57–1.00)
GFR calc Af Amer: 137 mL/min/{1.73_m2} (ref >59)
GFR calc non Af Amer: 119 mL/min/{1.73_m2} (ref >59)
Globulin, Total: 2.6 g/dL (ref 1.5–4.5)
Glucose: 116 mg/dL — ABNORMAL HIGH (ref 65–99)
Potassium: 3.3 mmol/L — ABNORMAL LOW (ref 3.5–5.2)
Sodium: 141 mmol/L (ref 134–144)
Total Protein: 7.3 g/dL (ref 6.0–8.5)

## 2020-04-17 LAB — APTT: aPTT: 27 s (ref 24–33)

## 2020-04-17 LAB — CBC
Hematocrit: 42.3 % (ref 34.0–46.6)
Hemoglobin: 14.3 g/dL (ref 11.1–15.9)
MCH: 28.8 pg (ref 26.6–33.0)
MCHC: 33.8 g/dL (ref 31.5–35.7)
MCV: 85 fL (ref 79–97)
Platelets: 272 10*3/uL (ref 150–450)
RBC: 4.96 x10E6/uL (ref 3.77–5.28)
RDW: 15.1 % (ref 11.7–15.4)
WBC: 6.8 10*3/uL (ref 3.4–10.8)

## 2020-04-17 LAB — PROTIME-INR
INR: 0.9 (ref 0.9–1.2)
Prothrombin Time: 10.1 s (ref 9.1–12.0)

## 2020-04-19 NOTE — Assessment & Plan Note (Addendum)
Increase HCTZ to 25 mg. BMP today with low potassium, will need repeat given increase and may need to supplement.

## 2020-04-21 ENCOUNTER — Encounter: Payer: Self-pay | Admitting: Family Medicine

## 2020-04-21 DIAGNOSIS — Z01818 Encounter for other preprocedural examination: Secondary | ICD-10-CM

## 2020-04-22 ENCOUNTER — Encounter: Payer: Self-pay | Admitting: Family Medicine

## 2020-04-24 ENCOUNTER — Other Ambulatory Visit: Payer: Self-pay

## 2020-04-24 ENCOUNTER — Other Ambulatory Visit: Payer: Self-pay | Admitting: Family Medicine

## 2020-04-24 ENCOUNTER — Other Ambulatory Visit: Payer: Medicaid Other

## 2020-04-24 DIAGNOSIS — I10 Essential (primary) hypertension: Secondary | ICD-10-CM | POA: Diagnosis not present

## 2020-04-25 LAB — T3, FREE: T3, Free: 3.2 pg/mL (ref 2.0–4.4)

## 2020-04-25 LAB — BETA HCG QUANT (REF LAB): hCG Quant: <1 m[IU]/mL

## 2020-04-25 LAB — TSH+FREE T4
Free T4: 1.18 ng/dL (ref 0.82–1.77)
TSH: 1.14 u[IU]/mL (ref 0.450–4.500)

## 2020-04-25 LAB — HIV ANTIBODY (ROUTINE TESTING W REFLEX): HIV Screen 4th Generation wRfx: NONREACTIVE

## 2020-04-28 ENCOUNTER — Encounter: Payer: Self-pay | Admitting: Family Medicine

## 2020-05-08 ENCOUNTER — Encounter: Payer: Self-pay | Admitting: Cardiology

## 2020-05-08 ENCOUNTER — Ambulatory Visit (INDEPENDENT_AMBULATORY_CARE_PROVIDER_SITE_OTHER): Payer: Medicaid Other | Admitting: Family Medicine

## 2020-05-08 ENCOUNTER — Encounter: Payer: Self-pay | Admitting: Family Medicine

## 2020-05-08 ENCOUNTER — Ambulatory Visit (INDEPENDENT_AMBULATORY_CARE_PROVIDER_SITE_OTHER): Payer: Medicaid Other | Admitting: Cardiology

## 2020-05-08 ENCOUNTER — Other Ambulatory Visit: Payer: Self-pay

## 2020-05-08 VITALS — BP 128/82 | HR 64 | Ht 67.0 in | Wt 191.0 lb

## 2020-05-08 VITALS — BP 152/78 | HR 77 | Temp 97.3°F | Ht 67.0 in | Wt 192.0 lb

## 2020-05-08 DIAGNOSIS — Z01818 Encounter for other preprocedural examination: Secondary | ICD-10-CM

## 2020-05-08 DIAGNOSIS — I10 Essential (primary) hypertension: Secondary | ICD-10-CM

## 2020-05-08 DIAGNOSIS — Z7189 Other specified counseling: Secondary | ICD-10-CM | POA: Diagnosis not present

## 2020-05-08 NOTE — Progress Notes (Signed)
   SUBJECTIVE:   CHIEF COMPLAINT / HPI:   Surgical clearance: Patient to leave for surgery in [place].  She needs repeat CMP given increase glucose on previous test as well as decreased potassium after starting HCTZ.  She denies any chest pain, shortness of breath, lower extremity edema.  PERTINENT  PMH / PSH: HTN  OBJECTIVE:  BP 128/82   Pulse 64   Ht 5\' 7"  (1.702 m)   Wt 191 lb (86.6 kg)   LMP 04/21/2020   BMI 29.91 kg/m   General: NAD, pleasant Neck: Supple, no LAD Cardiovascular: RRR, no m/r/g, no LE edema Respiratory: CTA BL, normal work of breathing Psych: AOx3, appropriate affect  ASSESSMENT/PLAN:   Essential hypertension, benign BP at goal today at 128/82.  Continue current management with amlodipine 10 mg and hydrochlorothiazide 25 mg.   Encounter for surgery: H&P filled out for surgical clearance.  Low risk according to surgical calculations.  Will repeat CMP in the morning when patient is fasting given that they did not want to do her surgery due to an elevated glucose.  Patient has had a bagel this morning.  Potassium will also need to be rechecked given that she was also recently started on HCTZ 25 mg.  [place] Nai Borromeo, DO PGY-3, [place]

## 2020-05-08 NOTE — Progress Notes (Signed)
Cardiology Office Note:    Date:  05/08/2020   ID:  Amy Snyder, DOB 01-17-1991, MRN [id number]  PCP:  Shirley, [place], DO  Cardiologist:  Buford Dresser, MD  Referring MD: Kinnie Feil, MD   CC: preoperative evaluation for surgery, history of hypertension  History of Present Illness:    Amy Snyder is a 29 y.o. female with a hx of hypertension who is seen as a new consult at the request of Kinnie Feil, MD for the evaluation and management of preoperative cardiovascular examination.  Last PCP note from 04/16/20 reviewed, Dr. [place] at the [facility]. Noted that elective surgery placed on hold due to elevated blood pressure. Her blood pressure was well controlled at that visit, and she was cleared medically for surgery.  Planned surgery: 05/15/20, [facility] [place], Dr. Skeet Latch. Brazilian butt lift.  Pertinent past cardiac history: none Prior cardiac workup: none History of valve disease: none History of CAD/PAD/CVA/TIA: none History of heart failure: none History of arrhythmia: none On anticoagulation: none Additional history: Endorses hypertension. Denies diabetes, CKD, OSA, anesthesia complications Current symptoms: Denies chest pain, shortness of breath at rest or with normal exertion. No PND, orthopnea, or unexpected weight gain. No syncope or palpitations. Occasional intermittent LE edema, mild. Functional capacity: can climb stairs daily, multiple floors. Walks daily at work.  Hypertension: first told years ago, has been on medications >5 years. Runs strongly in her family, especially her mom. Generally well controlled with a few exceptions. Tolerating amlodipine and HCTZ daily, hasn't taken HCTZ yet today. Doesn't check BP at home. She is feeling a little off today and wonders if this is affecting her BP.  No family history of heart attack or stroke.   Past Medical History:  Diagnosis Date  . BV (bacterial vaginosis)   . Endometriosis    per  biopsy done 11/2016  . History of cesarean section, classical 02/14/2019   Classical c section in 2010 d/t footling breech in active labor  . History of preterm delivery 02/14/2019  . Hypertension     Past Surgical History:  Procedure Laterality Date  . CESAREAN SECTION  10/21/2009   Classical c/s     Current Medications: Current Outpatient Medications on File Prior to Visit  Medication Sig  . acetaminophen (TYLENOL) 500 MG tablet Take 1,000 mg by mouth every 6 (six) hours as needed for mild pain.  Marland Kitchen amLODipine (NORVASC) 10 MG tablet Take 1 tablet (10 mg total) by mouth daily.  . hydrochlorothiazide (HYDRODIURIL) 25 MG tablet Take 1 tablet (25 mg total) by mouth daily.   No current facility-administered medications on file prior to visit.     Allergies:   Flagyl [metronidazole]   Social History   Tobacco Use  . Smoking status: Former Smoker    Packs/day: 0.20    Years: 3.00    Pack years: 0.60    Types: Cigarettes    Quit date: 12/21/2019    Years since quitting: 0.3  . Smokeless tobacco: Never Used  . Tobacco comment: smokes socially  Substance Use Topics  . Alcohol use: Yes    Alcohol/week: 2.0 standard drinks    Types: 2 Shots of liquor per week    Comment: socially  . Drug use: No    Family History: family history includes Diabetes in her maternal grandfather and another family member; Hypertension in her mother.  ROS:   Please see the history of present illness.  Additional pertinent ROS: Constitutional: Negative for chills, fever, night sweats,  unintentional weight loss  HENT: Negative for ear pain and hearing loss.   Eyes: Negative for loss of vision and eye pain.  Respiratory: Negative for cough, sputum, wheezing.   Cardiovascular: See HPI. Gastrointestinal: Negative for abdominal pain, melena, and hematochezia.  Genitourinary: Negative for dysuria and hematuria.  Musculoskeletal: Negative for falls and myalgias.  Skin: Negative for itching and rash.    Neurological: Negative for focal weakness, focal sensory changes and loss of consciousness.  Endo/Heme/Allergies: Does not bruise/bleed easily.     EKGs/Labs/Other Studies Reviewed:    The following studies were reviewed today: No prior cardiac studies  EKG:  EKG is personally reviewed.  The ekg ordered today demonstrates NSR, normal for age  Recent Labs: 04/16/2020: ALT 12; BUN 7; Creatinine, Ser 0.68; Hemoglobin 14.3; Platelets 272; Potassium 3.3; Sodium 141 04/24/2020: TSH 1.140  Recent Lipid Panel No results found for: CHOL, TRIG, HDL, CHOLHDL, VLDL, LDLCALC, LDLDIRECT  Physical Exam:    VS:  BP (!) 152/78 (BP Location: Right Arm, Patient Position: Sitting, Cuff Size: Normal)   Pulse 77   Temp (!) 97.3 F (36.3 C)   Ht 5\' 7"  (1.702 m)   Wt 192 lb (87.1 kg)   LMP 04/21/2020   SpO2 95%   BMI 30.07 kg/m     Wt Readings from Last 3 Encounters:  05/08/20 192 lb (87.1 kg)  05/08/20 191 lb (86.6 kg)  04/16/20 189 lb (85.7 kg)    GEN: Well nourished, well developed in no acute distress HEENT: Normal, moist mucous membranes NECK: No JVD CARDIAC: regular rhythm, normal S1 and S2, no rubs or gallops. No murmurs. VASCULAR: Radial and DP pulses 2+ bilaterally. No carotid bruits RESPIRATORY:  Clear to auscultation without rales, wheezing or rhonchi  ABDOMEN: Soft, non-tender, non-distended MUSCULOSKELETAL:  Ambulates independently SKIN: Warm and dry, no edema NEUROLOGIC:  Alert and oriented x 3. No focal neuro deficits noted. PSYCHIATRIC:  Normal affect    ASSESSMENT:    1. Pre-op evaluation   2. Essential hypertension, benign   3. Cardiac risk counseling   4. Counseling on health promotion and disease prevention    PLAN:    Preoperative cardiovascular evaluation: Based on available date, patient's RCRI score =0, which carries a 3.9,% 30-day risk of death, MI, or cardiac arrest.  The patient is not currently having active cardiac symptoms, and they can achieve >4 METs  of activity.  According to ACC/AHA Guidelines, no further testing is needed.  Proceed with surgery at acceptable risk.  She has also been cleared by her primary medical doctor  ECG without high risk findings.  Hypertension: repeat systolic number still in Q000111Q, but diastolic in Q000111Q. However, recently controlled at PCP visit. Hasn't taken HCTZ yet today -will need close follow up with PCP post hospitalization -discussed home BP monitoring as a very useful way to check control in a non-office setting. Instructions given. -continue amlodipine 10 mg daily and HCTZ 25 mg daily  Cardiac risk counseling and prevention recommendations: -recommend heart healthy/Mediterranean diet, with whole grains, fruits, vegetable, fish, lean meats, nuts, and olive oil. Limit salt. -recommend moderate walking, 3-5 times/week for 30-50 minutes each session. Aim for at least 150 minutes.week. Goal should be pace of 3 miles/hours, or walking 1.5 miles in 30 minutes -recommend avoidance of tobacco products. Avoid excess alcohol. -ASCVD risk score: The ASCVD Risk score Mikey Bussing DC Jr., et al., 2013) failed to calculate for the following reasons:   The 2013 ASCVD risk score is only valid for  ages 18 to 9    Plan for follow up: as needed  Buford Dresser, MD, PhD [place]  [facility]    Medication Adjustments/Labs and Tests Ordered: Current medicines are reviewed at length with the patient today.  Concerns regarding medicines are outlined above.  Orders Placed This Encounter  Procedures  . EKG 12-Lead   No orders of the defined types were placed in this encounter.   Patient Instructions  Medication Instructions:  Your Physician recommend you continue on your current medication as directed.    *If you need a refill on your cardiac medications before your next appointment, please call your pharmacy*   Lab Work: None   Testing/Procedures: None   Follow-Up: At [facility], you and your  health needs are our priority.  As part of our continuing mission to provide you with exceptional heart care, we have created designated Provider Care Teams.  These Care Teams include your primary Cardiologist (physician) and Advanced Practice Providers (APPs -  Physician Assistants and Nurse Practitioners) who all work together to provide you with the care you need, when you need it.  We recommend signing up for the patient portal called "MyChart".  Sign up information is provided on this After Visit Summary.  MyChart is used to connect with patients for Virtual Visits (Telemedicine).  Patients are able to view lab/test results, encounter notes, upcoming appointments, etc.  Non-urgent messages can be sent to your provider as well.   To learn more about what you can do with MyChart, go to [email or web address].    Your next appointment:   As needed  The format for your next appointment:   Either In Person or Virtual  Provider:   Buford Dresser, MD   How to check blood pressure:  -sit comfortably in a chair, feet uncrossed and flat on floor, for 5-10 minutes  -arm ideally should rest at the level of the heart. However, arm should be relaxed and not tense (for example, do not hold the arm up unsupported)  -avoid exercise, caffeine, and tobacco for at least 30 minutes prior to BP reading  -don't take BP cuff reading over clothes (always place on skin directly)  -I prefer to know how well the medication is working, so I would like you to take your readings 1-2 hours after taking your blood pressure medication if possible   Signed, Buford Dresser, MD PhD 05/08/2020  [place]

## 2020-05-08 NOTE — Patient Instructions (Signed)
Thank you for coming to see me today. It was a pleasure! Today we talked about:   Your blood pressure is good today. Please continue your current blood pressure. We will release your results on MyChart for your blood work.   Please follow-up with me or as needed.  If you have any questions or concerns, please do not hesitate to call the office at [phone].  Take Care,   [place] Halla Chopp, DO

## 2020-05-08 NOTE — Patient Instructions (Addendum)
Medication Instructions:  Your Physician recommend you continue on your current medication as directed.    *If you need a refill on your cardiac medications before your next appointment, please call your pharmacy*   Lab Work: None   Testing/Procedures: None   Follow-Up: At [facility], you and your health needs are our priority.  As part of our continuing mission to provide you with exceptional heart care, we have created designated Provider Care Teams.  These Care Teams include your primary Cardiologist (physician) and Advanced Practice Providers (APPs -  Physician Assistants and Nurse Practitioners) who all work together to provide you with the care you need, when you need it.  We recommend signing up for the patient portal called "MyChart".  Sign up information is provided on this After Visit Summary.  MyChart is used to connect with patients for Virtual Visits (Telemedicine).  Patients are able to view lab/test results, encounter notes, upcoming appointments, etc.  Non-urgent messages can be sent to your provider as well.   To learn more about what you can do with MyChart, go to [email or web address].    Your next appointment:   As needed  The format for your next appointment:   Either In Person or Virtual  Provider:   Buford Dresser, MD   How to check blood pressure:  -sit comfortably in a chair, feet uncrossed and flat on floor, for 5-10 minutes  -arm ideally should rest at the level of the heart. However, arm should be relaxed and not tense (for example, do not hold the arm up unsupported)  -avoid exercise, caffeine, and tobacco for at least 30 minutes prior to BP reading  -don't take BP cuff reading over clothes (always place on skin directly)  -I prefer to know how well the medication is working, so I would like you to take your readings 1-2 hours after taking your blood pressure medication if possible

## 2020-05-09 ENCOUNTER — Other Ambulatory Visit: Payer: Medicaid Other

## 2020-05-09 DIAGNOSIS — I10 Essential (primary) hypertension: Secondary | ICD-10-CM | POA: Diagnosis not present

## 2020-05-10 LAB — COMPREHENSIVE METABOLIC PANEL
ALT: 17 IU/L (ref 0–32)
AST: 22 IU/L (ref 0–40)
Albumin/Globulin Ratio: 1.7 (ref 1.2–2.2)
Albumin: 4.5 g/dL (ref 3.9–5.0)
Alkaline Phosphatase: 83 IU/L (ref 48–121)
BUN/Creatinine Ratio: 12 (ref 9–23)
BUN: 8 mg/dL (ref 6–20)
Bilirubin Total: 0.2 mg/dL (ref 0.0–1.2)
CO2: 22 mmol/L (ref 20–29)
Calcium: 9.3 mg/dL (ref 8.7–10.2)
Chloride: 101 mmol/L (ref 96–106)
Creatinine, Ser: 0.66 mg/dL (ref 0.57–1.00)
GFR calc Af Amer: 138 mL/min/{1.73_m2} (ref >59)
GFR calc non Af Amer: 120 mL/min/{1.73_m2} (ref >59)
Globulin, Total: 2.6 g/dL (ref 1.5–4.5)
Glucose: 92 mg/dL (ref 65–99)
Potassium: 4 mmol/L (ref 3.5–5.2)
Sodium: 139 mmol/L (ref 134–144)
Total Protein: 7.1 g/dL (ref 6.0–8.5)

## 2020-05-11 NOTE — Assessment & Plan Note (Signed)
BP at goal today at 128/82.  Continue current management with amlodipine 10 mg and hydrochlorothiazide 25 mg.

## 2020-05-12 ENCOUNTER — Telehealth: Payer: Self-pay | Admitting: Family Medicine

## 2020-05-12 ENCOUNTER — Other Ambulatory Visit: Payer: Self-pay

## 2020-05-12 ENCOUNTER — Telehealth: Payer: Self-pay | Admitting: Cardiology

## 2020-05-12 ENCOUNTER — Other Ambulatory Visit: Payer: Self-pay | Admitting: Family Medicine

## 2020-05-12 ENCOUNTER — Other Ambulatory Visit: Payer: Medicaid Other

## 2020-05-12 DIAGNOSIS — Z419 Encounter for procedure for purposes other than remedying health state, unspecified: Secondary | ICD-10-CM

## 2020-05-12 LAB — POCT GLYCOSYLATED HEMOGLOBIN (HGB A1C): Hemoglobin A1C: 5.9 % — AB (ref 4.0–5.6)

## 2020-05-12 NOTE — Telephone Encounter (Signed)
Per Pre Op Provider please send note to MD who saw pt 05/09/20 for pre op clearance.

## 2020-05-12 NOTE — Telephone Encounter (Signed)
Spoke with patient. Informed patient visit summary is now available in mychart. Patient cleared for procedure. Patient verbalized understanding.

## 2020-05-12 NOTE — Telephone Encounter (Signed)
Will forward to MD to place this order.  Amy Snyder,CMA

## 2020-05-12 NOTE — Telephone Encounter (Signed)
**  URGENT**  Patient is having surgery and is needing orders from Dr. Enid Derry to be able to have her A1C checked. She said she is needing the labs done today. She also wants to be called when the order for lab is put in so she can come in and get the labs done. Thanks

## 2020-05-12 NOTE — Telephone Encounter (Signed)
Patient is calling to follow up in regards to appointment completed on 05/08/20 for pre op clearance. She states she has not yet heard back from our office regarding whether or not she has been cleared to have the procedure. She states her procedure is scheduled for 05/14/20 and she would like to receive documentation prior to procedure. Please assist.

## 2020-05-12 NOTE — Telephone Encounter (Signed)
Order placed for A1c. Of note, patient recently had normal A1c and normal CBG and has no history of diabetes.

## 2020-05-12 NOTE — Telephone Encounter (Signed)
Patient is aware and appt made. Caidence Kaseman,CMA  

## 2020-05-12 NOTE — Telephone Encounter (Signed)
Should be available in mychart now. Thanks.

## 2020-05-12 NOTE — Telephone Encounter (Signed)
See previous phone note. Patient following up on obtaining documentation of her preop clearance. She wants to know if it can be sent through MyChart that way she does not have to come to the office. Please advise.

## 2020-05-29 ENCOUNTER — Other Ambulatory Visit: Payer: Self-pay

## 2020-05-29 ENCOUNTER — Emergency Department (HOSPITAL_COMMUNITY)
Admission: EM | Admit: 2020-05-29 | Discharge: 2020-05-29 | Disposition: A | Discharge status: Home / Self Care | Payer: Medicaid Other | Attending: Emergency Medicine | Admitting: Emergency Medicine

## 2020-05-29 ENCOUNTER — Encounter (HOSPITAL_COMMUNITY): Payer: Self-pay | Admitting: Emergency Medicine

## 2020-05-29 DIAGNOSIS — I10 Essential (primary) hypertension: Secondary | ICD-10-CM | POA: Diagnosis not present

## 2020-05-29 DIAGNOSIS — Z79899 Other long term (current) drug therapy: Secondary | ICD-10-CM | POA: Insufficient documentation

## 2020-05-29 DIAGNOSIS — Z87891 Personal history of nicotine dependence: Secondary | ICD-10-CM | POA: Diagnosis not present

## 2020-05-29 DIAGNOSIS — M79602 Pain in left arm: Secondary | ICD-10-CM | POA: Diagnosis not present

## 2020-05-29 LAB — BASIC METABOLIC PANEL
Anion gap: 13 (ref 5–15)
BUN: 6 mg/dL (ref 6–20)
CO2: 25 mmol/L (ref 22–32)
Calcium: 9.3 mg/dL (ref 8.9–10.3)
Chloride: 99 mmol/L (ref 98–111)
Creatinine, Ser: 0.65 mg/dL (ref 0.44–1.00)
GFR calc Af Amer: >60 mL/min (ref >60)
GFR calc non Af Amer: >60 mL/min (ref >60)
Glucose, Bld: 146 mg/dL — ABNORMAL HIGH (ref 70–99)
Potassium: 3.9 mmol/L (ref 3.5–5.1)
Sodium: 137 mmol/L (ref 135–145)

## 2020-05-29 LAB — CBC
HCT: 40 % (ref 36.0–46.0)
Hemoglobin: 13.2 g/dL (ref 12.0–15.0)
MCH: 29.4 pg (ref 26.0–34.0)
MCHC: 33 g/dL (ref 30.0–36.0)
MCV: 89.1 fL (ref 80.0–100.0)
Platelets: 315 10*3/uL (ref 150–400)
RBC: 4.49 MIL/uL (ref 3.87–5.11)
RDW: 12.5 % (ref 11.5–15.5)
WBC: 8.3 10*3/uL (ref 4.0–10.5)
nRBC: 0 % (ref 0.0–0.2)

## 2020-05-29 MED ORDER — NAPROXEN 500 MG PO TABS
500.0000 mg | ORAL_TABLET | Freq: Two times a day (BID) | ORAL | 0 refills | Status: AC
Start: 2020-05-29 — End: 2020-06-05

## 2020-05-29 NOTE — ED Triage Notes (Signed)
Pt c/o left arm cramping. States she was seen in a hospital in [facility] for low potassium, states she didn't take the medications she was prescribed.

## 2020-05-29 NOTE — ED Provider Notes (Signed)
[place] EMERGENCY DEPARTMENT Provider Note   CSN: [id number] Arrival date & time: 05/29/20  1646     History Chief Complaint  Patient presents with  . Arm Pain    Amy Snyder is a 29 y.o. female.  HPI     Presents with left arm pain. She notes that she has had pain in the left arm for almost 2 weeks, intermittently. It is somewhat better today than it was 2 weeks ago, when it first manifested. She was initially seen in [place], diagnosed with hypokalemia.  She has not been taking her supplement, no eating bananas until today. Today she presents due to pain in the anterior aspect of the arm when it is straight, not when the elbow is flexed.  There is no chest pain, no dyspnea, no fever, no loss of sensation. Patient works in Teacher, adult education, moves heavy objects sometimes.   Past Medical History:  Diagnosis Date  . BV (bacterial vaginosis)   . Endometriosis    per biopsy done 11/2016  . History of cesarean section, classical 02/14/2019   Classical c section in 2010 d/t footling breech in active labor  . History of preterm delivery 02/14/2019  . Hypertension     Patient Active Problem List   Diagnosis Date Noted  . At risk for sexually transmitted disease due to unprotected sex 02/19/2020  . Bartholin cyst 07/13/2019  . Endometriosis 12/06/2017  . Abnormal Pap smear of cervix 09/26/2016  . Essential hypertension, benign 07/03/2014  . 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 CESAREAN SECTION  10/21/2009   Classical c/s      OB History    Gravida  2   Para  1   Term      Preterm  1   AB  1   Living  1     SAB  1   TAB      Ectopic      Multiple      Live Births  1           Family History  Problem Relation Age of Onset  . Hypertension Mother   . Diabetes Other   . Diabetes Maternal Grandfather     Social History   Tobacco Use  . Smoking status: Former Smoker     Packs/day: 0.20    Years: 3.00    Pack years: 0.60    Types: Cigarettes    Quit date: 12/21/2019    Years since quitting: 0.4  . Smokeless tobacco: Never Used  . Tobacco comment: smokes socially  Vaping Use  . Vaping Use: Never used  Substance Use Topics  . Alcohol use: Yes    Alcohol/week: 2.0 standard drinks    Types: 2 Shots of liquor per week    Comment: socially  . Drug use: No    Home Medications Prior to Admission medications   Medication Sig Start Date End Date Taking? Authorizing Provider  acetaminophen (TYLENOL) 500 MG tablet Take 500-1,000 mg by mouth every 6 (six) hours as needed for mild pain or headache.    Yes [provider]  amLODipine (NORVASC) 10 MG tablet Take 1 tablet (10 mg total) by mouth daily. 03/13/20  Yes Enid Derry, [place], DO  Aspirin-Acetaminophen-Caffeine (GOODY HEADACHE PO) Take 1 packet by mouth as needed (for headaches).   Yes [provider]  hydrochlorothiazide (HYDRODIURIL) 25 MG tablet Take 1 tablet (25 mg total) by mouth  daily. 04/16/20  Yes Enid Derry, [place], DO  ibuprofen (ADVIL) 200 MG tablet Take 200-800 mg by mouth every 6 (six) hours as needed for headache or mild pain.   Yes [provider]    Allergies    Flagyl [metronidazole]  Review of Systems   Review of Systems  Constitutional:       Per HPI, otherwise negative  HENT:       Per HPI, otherwise negative  Respiratory:       Per HPI, otherwise negative  Cardiovascular:       Per HPI, otherwise negative  Gastrointestinal: Negative for vomiting.  Endocrine:       Negative aside from HPI  Genitourinary:       Neg aside from HPI   Musculoskeletal:       Per HPI, otherwise negative  Skin: Negative.   Neurological: Negative for syncope.    Physical Exam Updated Vital Signs BP (!) 159/112 (BP Location: Left Arm)   Pulse 65   Temp 98.4 F (36.9 C) (Oral)   Resp 16   Ht 5\' 7"  (1.702 m)   Wt 85.3 kg   SpO2 100%   BMI 29.44 kg/m   Physical  Exam Vitals and nursing note reviewed.  Constitutional:      General: She is not in acute distress.    Appearance: She is well-developed.  HENT:     Head: Normocephalic and atraumatic.  Eyes:     Conjunctiva/sclera: Conjunctivae normal.  Cardiovascular:     Rate and Rhythm: Normal rate and regular rhythm.     Pulses: Normal pulses.  Pulmonary:     Effort: Pulmonary effort is normal. No respiratory distress.     Breath sounds: No stridor.  Abdominal:     General: There is no distension.  Musculoskeletal:     Comments: Pain described as present with full flexion along the anterior surface, but not worse with flexion hyperextension, supination, pronation, or any other range of motion which is all essentially unremarkable.  Skin:    General: Skin is warm and dry.  Neurological:     Mental Status: She is alert and oriented to person, place, and time.     Cranial Nerves: No cranial nerve deficit.     ED Results / Procedures / Treatments   Labs (all labs ordered are listed, but only abnormal results are displayed) Labs Reviewed  BASIC METABOLIC PANEL - Abnormal; Notable for the following components:      Result Value   Glucose, Bld 146 (*)    All other components within normal limits  CBC    Procedures Procedures (including critical care time)  Medications Ordered in ED Medications - No data to display  ED Course  I have reviewed the triage vital signs and the nursing notes.  Pertinent labs & imaging results that were available during my care of the patient were reviewed by me and considered in my medical decision making (see chart for details).  Generally well-appearing female presents with left arm pain.  She is distally neurovascularly intact, has no history of trauma, and her history of proteinuria suggest musculoskeletal etiology.  No loss of range of motion, no asymmetry, no skin color changes suggesting either DVT or cellulitis. Patient is not on blood thinners, oral  contraceptives, or smoking, all reassuring for low suspicion of hematoma or DVT as well. Patient discharged in stable condition. Final Clinical Impression(s) / ED Diagnoses Final diagnoses:  Left arm pain     Vanita Panda,  Herbie Baltimore, MD 05/29/20 2227

## 2020-05-29 NOTE — Discharge Instructions (Signed)
As discussed, your evaluation today has been largely reassuring.  But, it is important that you monitor your condition carefully, and do not hesitate to return to the ED if you develop new, or concerning changes in your condition. ? ?Otherwise, please follow-up with your physician for appropriate ongoing care. ? ?

## 2020-05-30 ENCOUNTER — Other Ambulatory Visit (HOSPITAL_COMMUNITY)
Admission: RE | Admit: 2020-05-30 | Discharge: 2020-05-30 | Disposition: A | Discharge status: Home / Self Care | Payer: Medicaid Other | Source: Ambulatory Visit | Attending: Family Medicine | Admitting: Family Medicine

## 2020-05-30 ENCOUNTER — Telehealth: Payer: Self-pay | Admitting: Family Medicine

## 2020-05-30 ENCOUNTER — Encounter: Payer: Self-pay | Admitting: Family Medicine

## 2020-05-30 ENCOUNTER — Ambulatory Visit (INDEPENDENT_AMBULATORY_CARE_PROVIDER_SITE_OTHER): Payer: Medicaid Other | Admitting: Family Medicine

## 2020-05-30 ENCOUNTER — Ambulatory Visit: Payer: Medicaid Other | Admitting: Family Medicine

## 2020-05-30 VITALS — BP 110/80 | HR 75 | Ht 67.0 in | Wt 192.6 lb

## 2020-05-30 DIAGNOSIS — Z9189 Other specified personal risk factors, not elsewhere classified: Secondary | ICD-10-CM

## 2020-05-30 DIAGNOSIS — I10 Essential (primary) hypertension: Secondary | ICD-10-CM

## 2020-05-30 DIAGNOSIS — Z202 Contact with and (suspected) exposure to infections with a predominantly sexual mode of transmission: Secondary | ICD-10-CM

## 2020-05-30 DIAGNOSIS — N760 Acute vaginitis: Secondary | ICD-10-CM | POA: Diagnosis not present

## 2020-05-30 LAB — POCT WET PREP (WET MOUNT)
Clue Cells Wet Prep Whiff POC: POSITIVE
Trichomonas Wet Prep HPF POC: ABSENT

## 2020-05-30 MED ORDER — METRONIDAZOLE 0.75 % VA GEL: 1.0000 | Freq: Two times a day (BID) | VAGINAL | 0 refills | Status: DC

## 2020-05-30 NOTE — Assessment & Plan Note (Signed)
BP rechecked by me and looks good. Continue current regimen. F/U with surgeon as planned.

## 2020-05-30 NOTE — Assessment & Plan Note (Signed)
Recent HIV one month ago and recent RPR were negative. Upreg offered today, although might be negative given unprotected sex 1 week ago. She prefers to defer upreg. She will get home pregnancy test done. STD counseling done. GC/Chlamydia tested today. I will contact her soon with the result.

## 2020-05-30 NOTE — Telephone Encounter (Signed)
+   BV discussed with her.

## 2020-05-30 NOTE — Patient Instructions (Signed)

## 2020-05-30 NOTE — Progress Notes (Signed)
° ° °  SUBJECTIVE:   CHIEF COMPLAINT / HPI:   HTN:She is yet to take her BP meds this morning. She normally takes them in the morning after meal. She is compliant with HCTZ 25 mg qd and Norvasc 10 mg qd. She concerns that her BP goes up whenever she goes to her plastic surgeon for [place] hip surgery, her BP rises. No other BP concerns today.  STD screen/Vaginal discharge: Unprotected sex about 1 week ago. Also feels she might have BV. She is not on birth control. She is not trying to get pregnant.    PERTINENT  PMH / PSH: PMX reviewed.  OBJECTIVE:   Vitals:   05/30/20 0919 05/30/20 0929  BP: 125/75 110/80  Pulse: 75   SpO2: 100%   Weight: 192 lb 9.6 oz (87.4 kg)   Height: 5\' 7"  (1.702 m)    Physical Exam Vitals and nursing note reviewed. Exam conducted with a chaperone present Sharyn Lull SImpson).  Cardiovascular:     Rate and Rhythm: Normal rate and regular rhythm.     Pulses: Normal pulses.     Heart sounds: Normal heart sounds. No murmur heard.   Pulmonary:     Effort: Pulmonary effort is normal. No respiratory distress.     Breath sounds: Normal breath sounds. No wheezing.  Abdominal:     General: Abdomen is flat. Bowel sounds are normal. There is no distension.     Palpations: There is no mass.     Tenderness: There is no abdominal tenderness.  Genitourinary:    Pubic Area: No rash.      Vagina: Normal.     Cervix: Discharge present. No cervical motion tenderness.     Uterus: Normal.      Adnexa: Right adnexa normal.     Comments: + Creamy white discharge     ASSESSMENT/PLAN:   Essential hypertension, benign BP rechecked by me and looks good. Continue current regimen. F/U with surgeon as planned.  At risk for sexually transmitted disease due to unprotected sex Recent HIV one month ago and recent RPR were negative. Upreg offered today, although might be negative given unprotected sex 1 week ago. She prefers to defer upreg. She will get home pregnancy test  done. STD counseling done. GC/Chlamydia tested today. I will contact her soon with the result.  Vaginitis Wet prep obtained during visit came positive for BV. I called her with the result. Also documented hx of allergy to Metronidazole. She confirmed she uses Metrogel instead without s/es. I reviewed her record and indeed she had recent MetroGel prescription. MetroGel escribed for BV. F/U as needed.     Andrena Mews, MD [place]

## 2020-05-30 NOTE — Assessment & Plan Note (Signed)
Wet prep obtained during visit came positive for BV. I called her with the result. Also documented hx of allergy to Metronidazole. She confirmed she uses Metrogel instead without s/es. I reviewed her record and indeed she had recent MetroGel prescription. MetroGel escribed for BV. F/U as needed.

## 2020-06-02 ENCOUNTER — Telehealth: Payer: Self-pay | Admitting: Family Medicine

## 2020-06-02 LAB — CERVICOVAGINAL ANCILLARY ONLY
Chlamydia: NEGATIVE
Comment: NEGATIVE
Comment: NEGATIVE
Comment: NORMAL
Neisseria Gonorrhea: NEGATIVE
Trichomonas: NEGATIVE

## 2020-06-02 NOTE — Telephone Encounter (Signed)
Negative GC/Chlamydia discussed with her. No further questions.

## 2020-06-17 ENCOUNTER — Other Ambulatory Visit: Payer: Self-pay

## 2020-06-17 ENCOUNTER — Encounter: Payer: Self-pay | Admitting: Family Medicine

## 2020-06-17 ENCOUNTER — Ambulatory Visit (INDEPENDENT_AMBULATORY_CARE_PROVIDER_SITE_OTHER): Payer: Medicaid Other | Admitting: Family Medicine

## 2020-06-17 DIAGNOSIS — I10 Essential (primary) hypertension: Secondary | ICD-10-CM

## 2020-06-17 NOTE — Assessment & Plan Note (Signed)
BP at goal today at 120/70.  Had lengthy discussion with patient regarding the need to increase treatment for her blood pressure.  Likely that patient is having increased blood pressure due to anxiety over her surgery while she is in [place].  Discussed at length relaxation techniques for patient including breathing exercises.  Also encouraged patient to include exercise in her daily routine as exercise will help with anxiety as well as to help to better control her blood pressure.  Patient encouraged to get a blood pressure monitor for her to have at home so that she may continue to use it to isolate what might be causing her anxiety.  Again discussed with patient that this is an elective surgery and given the risks of initiating more blood pressure medications it would not be indicated at this time.  Patient will follow up in a couple of weeks if she is continuing to have elevated blood pressures at home with her new blood pressure monitor otherwise she will follow-up as needed.

## 2020-06-17 NOTE — Patient Instructions (Addendum)
Thank you for coming to see me today. It was a pleasure! Today we talked about:   I recommend getting a blood pressure cuff at home and trying becoming exercises and breathing that we went over in clinic today in order to help with the anxiety around your blood pressure monitoring.  You could download an app called "breathe"or an app called "relax" order to help you with relaxing during one of your attacks.  I also recommend trying to exercise regularly as this can help lower your blood pressure as well as help with anxiety.  The [employer] of cardiology recommends having 150 minutes/week of exercise.  You may do this in 30-minute intervals 5 times per week for 50-minutes 3 times per week- for example.  Please follow-up with our clinic as needed.  If you have any questions or concerns, please do not hesitate to call the office at [phone].  Take Care,   [place] Spurgeon Gancarz, DO

## 2020-06-17 NOTE — Progress Notes (Signed)
   SUBJECTIVE:   CHIEF COMPLAINT / HPI:   Hypertension: Patient reports that she is upset because every time she goes to [place] to have her [place] but left her blood pressure is elevated.  She believes this is due to anxiety however they told her at the surgical center that it may be due to her medications.  Patient states that she takes her medications every morning prior to having the surgery done.  She states that she is not having any trouble with shortness of breath, chest pain, leg swelling.  She reports that she is not having any headaches or any other signs that her blood pressure is elevated however each time they use a mechanical machine to test her BP and even on recheck it remains elevated.  She states that they have never done a manual read and they often do not allow her much time to rest in between.  She states she is anxious because she has now been multiple times and had elevated blood pressure.  She is concerned today and is not sure what to do.  Patient reports that she has not really had an issue with anxiety prior to this.  PERTINENT  PMH / PSH: HTN  OBJECTIVE:  BP 120/70   Pulse 78   Ht 5\' 7"  (1.702 m)   Wt 192 lb (87.1 kg)   LMP 06/12/2020   SpO2 98%   BMI 30.07 kg/m   General: NAD, pleasant Neck: Supple Respiratory: normal work of breathing Psych: AOx3, appropriate affect  ASSESSMENT/PLAN:   Essential hypertension, benign BP at goal today at 120/70.  Had lengthy discussion with patient regarding the need to increase treatment for her blood pressure.  Likely that patient is having increased blood pressure due to anxiety over her surgery while she is in [place].  Discussed at length relaxation techniques for patient including breathing exercises.  Also encouraged patient to include exercise in her daily routine as exercise will help with anxiety as well as to help to better control her blood pressure.  Patient encouraged to get a blood pressure monitor for her to have  at home so that she may continue to use it to isolate what might be causing her anxiety.  Again discussed with patient that this is an elective surgery and given the risks of initiating more blood pressure medications it would not be indicated at this time.  Patient will follow up in a couple of weeks if she is continuing to have elevated blood pressures at home with her new blood pressure monitor otherwise she will follow-up as needed.   [place] Mitchell Epling, DO PGY-3, [facility]

## 2020-06-25 ENCOUNTER — Ambulatory Visit (HOSPITAL_COMMUNITY)
Admission: EM | Admit: 2020-06-25 | Discharge: 2020-06-25 | Disposition: A | Discharge status: Home / Self Care | Payer: Medicaid Other | Attending: Emergency Medicine | Admitting: Emergency Medicine

## 2020-06-25 ENCOUNTER — Encounter (HOSPITAL_COMMUNITY): Payer: Self-pay | Admitting: Emergency Medicine

## 2020-06-25 ENCOUNTER — Ambulatory Visit (HOSPITAL_COMMUNITY): Payer: Medicaid Other

## 2020-06-25 ENCOUNTER — Other Ambulatory Visit: Payer: Self-pay

## 2020-06-25 DIAGNOSIS — S161XXA Strain of muscle, fascia and tendon at neck level, initial encounter: Secondary | ICD-10-CM

## 2020-06-25 MED ORDER — CYCLOBENZAPRINE HCL 5 MG PO TABS
5.0000 mg | ORAL_TABLET | Freq: Three times a day (TID) | ORAL | 0 refills | Status: DC | PRN
Start: 2020-06-25 — End: 2020-08-01

## 2020-06-25 MED ORDER — NAPROXEN 500 MG PO TABS
500.0000 mg | ORAL_TABLET | Freq: Two times a day (BID) | ORAL | 0 refills | Status: DC
Start: 2020-06-25 — End: 2020-08-01
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
## 2020-06-25 NOTE — Discharge Instructions (Signed)
Muscle relaxer up to three times a day as night, may be helpful before bed. May cause drowsiness. Please do not take if driving or drinking alcohol.   Heat, massage, light stretching. Activity as tolerated.  If symptoms worsen or do not improve in the next 3 weeks to return to be seen or to follow up with your PCP.
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
## 2020-06-25 NOTE — ED Notes (Signed)
Went to get patient for cervical spine x-ray exam. Patient refused to remove wig, had metal clips, patient had a printed t-shirt on, didn't want to put a gown on. Notified provider. Metal clips in wig will interfere with x-rays

## 2020-06-30 ENCOUNTER — Other Ambulatory Visit: Payer: Self-pay | Admitting: Family Medicine

## 2020-08-01 ENCOUNTER — Encounter (HOSPITAL_COMMUNITY): Payer: Self-pay

## 2020-08-01 ENCOUNTER — Other Ambulatory Visit: Payer: Self-pay

## 2020-08-01 ENCOUNTER — Ambulatory Visit (INDEPENDENT_AMBULATORY_CARE_PROVIDER_SITE_OTHER): Payer: Medicaid Other

## 2020-08-01 ENCOUNTER — Ambulatory Visit (HOSPITAL_COMMUNITY)
Admission: EM | Admit: 2020-08-01 | Discharge: 2020-08-01 | Disposition: A | Discharge status: Home / Self Care | Payer: Medicaid Other | Attending: Family Medicine | Admitting: Family Medicine

## 2020-08-01 DIAGNOSIS — Z3202 Encounter for pregnancy test, result negative: Secondary | ICD-10-CM

## 2020-08-01 DIAGNOSIS — M25531 Pain in right wrist: Secondary | ICD-10-CM

## 2020-08-01 DIAGNOSIS — S6991XA Unspecified injury of right wrist, hand and finger(s), initial encounter: Secondary | ICD-10-CM | POA: Diagnosis not present

## 2020-08-01 LAB — POC URINE PREG, ED: Preg Test, Ur: NEGATIVE

## 2020-08-01 MED ORDER — DICLOFENAC SODIUM 75 MG PO TBEC
75.0000 mg | DELAYED_RELEASE_TABLET | Freq: Two times a day (BID) | ORAL | 0 refills | Status: DC
Start: 2020-08-01 — End: 2020-12-16

## 2020-08-01 NOTE — ED Provider Notes (Signed)
[facility]   [id number] 08/01/20 Arrival Time: 7494  ASSESSMENT & PLAN:  1. Right wrist pain     I have personally viewed the imaging studies ordered this visit. No wrist fracture appreciated.  Provided with wrist splint. Activities as tolerated.  Begin: Meds ordered this encounter  Medications  . diclofenac (VOLTAREN) 75 MG EC tablet    Sig: Take 1 tablet (75 mg total) by mouth 2 (two) times daily.    Dispense:  14 tablet    Refill:  0    Orders Placed This Encounter  Procedures  . DG Wrist Complete Right  . Apply Wrist brace  . POC urine preg, ED (not at [facility])    Recommend:  Follow-up Information    [facility].   Why: If worsening or failing to improve as anticipated. Contact information: [address] [place] [phone]              Reviewed expectations re: course of current medical issues. Questions answered. Outlined signs and symptoms indicating need for more acute intervention. Patient verbalized understanding. After Visit Summary given.  SUBJECTIVE: History from: patient. Amy Snyder is a 29 y.o. female who reports fairly persistent moderate pain of her right wrist; described as aching; without radiation. Onset: abrupt. First noted: a week ago. Injury/trama: reports falling; questions injury at that time; noticed pain shortly afterward. Symptoms have progressed to a point and plateaued since beginning. Aggravating factors: certain movements Alleviating factors: rest. Associated symptoms: none reported. Extremity sensation changes or weakness: none. Self treatment: none reported History of similar: no.  Past Surgical History:  Procedure Laterality Date  . CESAREAN SECTION  10/21/2009   Classical c/s       OBJECTIVE:  Vitals:   08/01/20 1401 08/01/20 1402  BP: 130/83   Pulse: 61   Resp: 16   Temp: 98.2 F (36.8 C)   TempSrc: Oral   Weight:  86.2 kg    Height:  5\' 7"  (1.702 m)    General appearance: alert; no distress HEENT: [place]; AT Neck: supple with FROM Resp: unlabored respirations Extremities: . RUE: warm with well perfused appearance; poorly localized mild tenderness over right wrist; without gross deformities; swelling: none; bruising: none; wrist ROM: normal CV: brisk extremity capillary refill of RUE; 2+ radial pulse of RUE. Skin: warm and dry; no visible rashes Neurologic: normal sensation and strength of RUE Psychological: alert and cooperative; normal mood and affect  Imaging: DG Wrist Complete Right  Result Date: 08/01/2020 CLINICAL DATA:  Per pt: injury to the right wrist 2 weeks ago, fell. Yesterday was doing heavy lifting and pain has increased since yesterday. Pain is the right wrist, distal ulna and distal radius. No prior known fractures to the right wrist. No diabetes. EXAM: RIGHT WRIST - COMPLETE 3+ VIEW COMPARISON:  None. FINDINGS: There is no evidence of fracture or dislocation. There is no evidence of arthropathy or other focal bone abnormality. Soft tissues are unremarkable. IMPRESSION: Negative radiographs of the right wrist. Electronically Signed   By: Audie Pinto M.D.   On: 08/01/2020 14:49      Allergies  Allergen Reactions  . Flagyl [Metronidazole] Itching, Swelling and Rash    Past Medical History:  Diagnosis Date  . BV (bacterial vaginosis)   . Endometriosis    per biopsy done 11/2016  . History of cesarean section, classical 02/14/2019   Classical c section in 2010 d/t footling breech in active labor  .  History of preterm delivery 02/14/2019  . Hypertension    Social History   Socioeconomic History  . Marital status: Single    Spouse name: Not on file  . Number of children: 1  . Years of education: Not on file  . Highest education level: Not on file  Occupational History  . Occupation: [employer]  Tobacco Use  . Smoking status: Former Smoker    Packs/day: 0.20    Years: 3.00     Pack years: 0.60    Types: Cigarettes    Quit date: 12/21/2019    Years since quitting: 0.6  . Smokeless tobacco: Never Used  . Tobacco comment: smokes socially  Vaping Use  . Vaping Use: Never used  Substance and Sexual Activity  . Alcohol use: Yes    Alcohol/week: 2.0 standard drinks    Types: 2 Shots of liquor per week    Comment: socially  . Drug use: No  . Sexual activity: Yes    Partners: Male    Birth control/protection: None, Condom    Comment: Sometimes  Other Topics Concern  . Not on file  Social History Narrative  . Not on file   Social Determinants of Health   Financial Resource Strain:   . Difficulty of Paying Living Expenses:   Food Insecurity:   . Worried About Charity fundraiser in the Last Year:   . Arboriculturist in the Last Year:   Transportation Needs:   . Film/video editor (Medical):   Marland Kitchen Lack of Transportation (Non-Medical):   Physical Activity:   . Days of Exercise per Week:   . Minutes of Exercise per Session:   Stress:   . Feeling of Stress :   Social Connections:   . Frequency of Communication with Friends and Family:   . Frequency of Social Gatherings with Friends and Family:   . Attends Religious Services:   . Active Member of Clubs or Organizations:   . Attends Archivist Meetings:   Marland Kitchen Marital Status:    Family History  Problem Relation Age of Onset  . Hypertension Mother   . Diabetes Other   . Diabetes Maternal Grandfather    Past Surgical History:  Procedure Laterality Date  . CESAREAN SECTION  10/21/2009   Classical c/s       Vanessa Kick, MD 08/01/20 1512

## 2020-08-01 NOTE — ED Triage Notes (Signed)
Pt was running and fell 2 wks ago. Pt started having right wrist pain after fall 2 wks ago, but has been ignoring the pain. Pt states she was lifting at work yesterday and made the right wrist pain worse. Pt has trace swelling of right wrist, 2+ right radial pulse, cap refill less than 3 sec, 2/5 right grip strength. Pt denies numbness and tingling.

## 2020-09-01 ENCOUNTER — Ambulatory Visit: Payer: Medicaid Other | Admitting: Obstetrics

## 2020-09-05 ENCOUNTER — Ambulatory Visit: Payer: Medicaid Other | Admitting: Obstetrics

## 2020-09-07 DIAGNOSIS — R05 Cough: Secondary | ICD-10-CM | POA: Diagnosis not present

## 2020-09-07 DIAGNOSIS — R519 Headache, unspecified: Secondary | ICD-10-CM | POA: Diagnosis not present

## 2020-09-07 DIAGNOSIS — R0981 Nasal congestion: Secondary | ICD-10-CM | POA: Diagnosis not present

## 2020-09-07 DIAGNOSIS — U071 COVID-19: Secondary | ICD-10-CM | POA: Diagnosis not present

## 2020-09-15 ENCOUNTER — Ambulatory Visit (INDEPENDENT_AMBULATORY_CARE_PROVIDER_SITE_OTHER): Payer: Medicaid Other | Admitting: Obstetrics

## 2020-09-15 ENCOUNTER — Other Ambulatory Visit: Payer: Self-pay

## 2020-09-15 ENCOUNTER — Other Ambulatory Visit (HOSPITAL_COMMUNITY)
Admission: RE | Admit: 2020-09-15 | Discharge: 2020-09-15 | Disposition: A | Discharge status: Home / Self Care | Payer: Medicaid Other | Source: Ambulatory Visit | Attending: Obstetrics | Admitting: Obstetrics

## 2020-09-15 ENCOUNTER — Encounter: Payer: Self-pay | Admitting: Obstetrics

## 2020-09-15 VITALS — BP 138/97 | HR 83 | Wt 187.0 lb

## 2020-09-15 DIAGNOSIS — N898 Other specified noninflammatory disorders of vagina: Secondary | ICD-10-CM | POA: Diagnosis not present

## 2020-09-15 DIAGNOSIS — Z113 Encounter for screening for infections with a predominantly sexual mode of transmission: Secondary | ICD-10-CM | POA: Diagnosis not present

## 2020-09-15 NOTE — Progress Notes (Signed)
[name], female   DOB: January 09, 1991, 29 y.o.   MRN: [id number]  Chief Complaint  Patient presents with  . Gynecologic Exam    std screen    HPI Amy Snyder is a 29 y.o. female.  Complains of vaginal discharge. HPI  Past Medical History:  Diagnosis Date  . BV (bacterial vaginosis)   . Endometriosis    per biopsy done 11/2016  . History of cesarean section, classical 02/14/2019   Classical c section in 2010 d/t footling breech in active labor  . History of preterm delivery 02/14/2019  . Hypertension     Past Surgical History:  Procedure Laterality Date  . CESAREAN SECTION  10/21/2009   Classical c/s     Family History  Problem Relation Age of Onset  . Hypertension Mother   . Diabetes Other   . Diabetes Maternal Grandfather     Social History Social History   Tobacco Use  . Smoking status: Former Smoker    Packs/day: 0.20    Years: 3.00    Pack years: 0.60    Types: Cigarettes    Quit date: 12/21/2019    Years since quitting: 0.7  . Smokeless tobacco: Never Used  . Tobacco comment: smokes socially  Vaping Use  . Vaping Use: Never used  Substance Use Topics  . Alcohol use: Yes    Alcohol/week: 2.0 standard drinks    Types: 2 Shots of liquor per week    Comment: socially  . Drug use: No    Allergies  Allergen Reactions  . Flagyl [Metronidazole] Itching, Swelling and Rash    Current Outpatient Medications  Medication Sig Dispense Refill  . amLODipine (NORVASC) 10 MG tablet TAKE 1 TABLET BY MOUTH EVERY DAY 90 tablet 2  . hydrochlorothiazide (HYDRODIURIL) 25 MG tablet Take 1 tablet (25 mg total) by mouth daily. 30 tablet 3  . acetaminophen (TYLENOL) 500 MG tablet Take 500-1,000 mg by mouth every 6 (six) hours as needed for mild pain or headache.     . Aspirin-Acetaminophen-Caffeine (GOODY HEADACHE PO) Take 1 packet by mouth as needed (for headaches). (Patient not taking: Reported on 09/15/2020)    . diclofenac (VOLTAREN) 75 MG EC tablet Take 1  tablet (75 mg total) by mouth 2 (two) times daily. 14 tablet 0  . ibuprofen (ADVIL) 200 MG tablet Take 200-800 mg by mouth every 6 (six) hours as needed for headache or mild pain. (Patient not taking: Reported on 05/30/2020)     No current facility-administered medications for this visit.    Review of Systems Review of Systems Constitutional: negative for fatigue and weight loss Respiratory: negative for cough and wheezing Cardiovascular: negative for chest pain, fatigue and palpitations Gastrointestinal: negative for abdominal pain and change in bowel habits Genitourinary: positive for vaginal discharge Integument/breast: negative for nipple discharge Musculoskeletal:negative for myalgias Neurological: negative for gait problems and tremors Behavioral/Psych: negative for abusive relationship, depression Endocrine: negative for temperature intolerance      Blood pressure (!) 138/97, pulse 83, weight 187 lb (84.8 kg), last menstrual period 08/30/2020.  Physical Exam Physical Exam General:   alert and no distress  Skin:   no rash or abnormalities  Lungs:   clear to auscultation bilaterally  Heart:   regular rate and rhythm, S1, S2 normal, no murmur, click, rub or gallop  Breasts:   normal without suspicious masses, skin or nipple changes or axillary nodes  Abdomen:  normal findings: no organomegaly, soft, non-tender and no hernia  Pelvis:  External genitalia: normal general appearance Urinary system: urethral meatus normal and bladder without fullness, nontender Vaginal: normal without tenderness, induration or masses Cervix: normal appearance Adnexa: normal bimanual exam Uterus: anteverted and non-tender, normal size    50% of 15 min visit spent on counseling and coordination of care.   Data Reviewed Wet Prep  Assessment     1. Vaginal discharge Rx: - Cervicovaginal ancillary only( [facility])  2. Screen for STD (sexually transmitted disease) Rx: - HIV Antibody  (routine testing w rflx) - Hepatitis B surface antigen - RPR - Hepatitis C antibody    Plan   Follow up in 3 months for annual  Orders Placed This Encounter  Procedures  . HIV Antibody (routine testing w rflx)  . Hepatitis B surface antigen  . RPR  . Hepatitis C antibody     Shelly Bombard, MD 09/15/2020 1:48 PM

## 2020-09-16 LAB — CERVICOVAGINAL ANCILLARY ONLY
Bacterial Vaginitis (gardnerella): POSITIVE — AB
Candida Glabrata: NEGATIVE
Candida Vaginitis: NEGATIVE
Chlamydia: NEGATIVE
Comment: NEGATIVE
Comment: NEGATIVE
Comment: NEGATIVE
Comment: NEGATIVE
Comment: NEGATIVE
Comment: NORMAL
Neisseria Gonorrhea: NEGATIVE
Trichomonas: NEGATIVE

## 2020-09-16 LAB — RPR: RPR Ser Ql: NONREACTIVE

## 2020-09-16 LAB — HEPATITIS B SURFACE ANTIGEN: Hepatitis B Surface Ag: NEGATIVE

## 2020-09-16 LAB — HIV ANTIBODY (ROUTINE TESTING W REFLEX): HIV Screen 4th Generation wRfx: NONREACTIVE

## 2020-09-16 LAB — HEPATITIS C ANTIBODY: Hep C Virus Ab: <0.1 s/co ratio (ref 0.0–0.9)

## 2020-09-17 ENCOUNTER — Other Ambulatory Visit: Payer: Self-pay | Admitting: Obstetrics

## 2020-09-17 DIAGNOSIS — B9689 Other specified bacterial agents as the cause of diseases classified elsewhere: Secondary | ICD-10-CM

## 2020-09-17 MED ORDER — CLINDAMYCIN HCL 300 MG PO CAPS: 300.0000 mg | ORAL_CAPSULE | Freq: Three times a day (TID) | ORAL | 0 refills | Status: DC

## 2020-09-25 IMAGING — US US OB < 14 WEEKS - US OB TV
1 series · 15 of 28 positions shown · non-contrast
Comparison: None for this gestation

CLINICAL DATA: Pregnancy of unknown anatomic location; positive
pregnancy test, LMP 11/30/2018; history endometriosis, hypertension,
former smoker

EXAM:
OBSTETRIC <14 WK US AND TRANSVAGINAL OB US
TECHNIQUE: Both transabdominal and transvaginal ultrasound examinations were
performed for complete evaluation of the gestation as well as the
maternal uterus, adnexal regions, and pelvic cul-de-sac.
Transvaginal technique was performed to assess early pregnancy.

[Series 1: us ob < 14 weeks - us ob tv · 73 acquisitions, 15 frames shown]
[im 1/73]
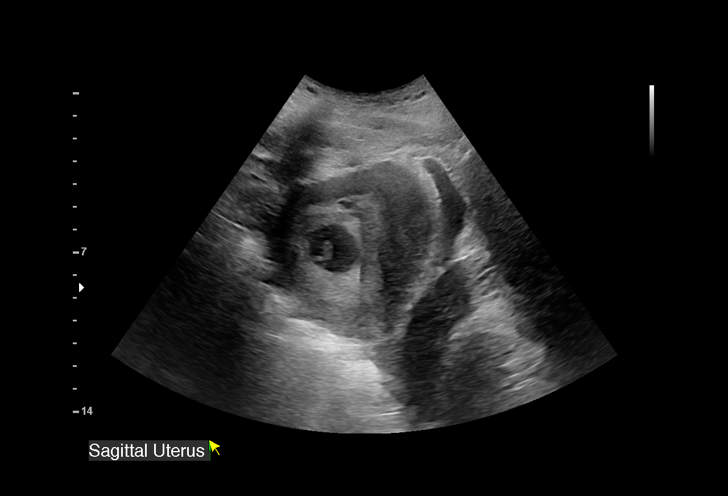
[im 6/73]
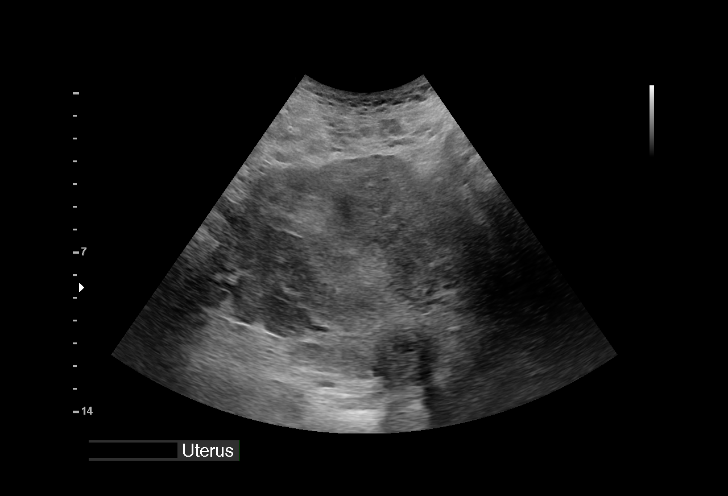
[im 11/73]
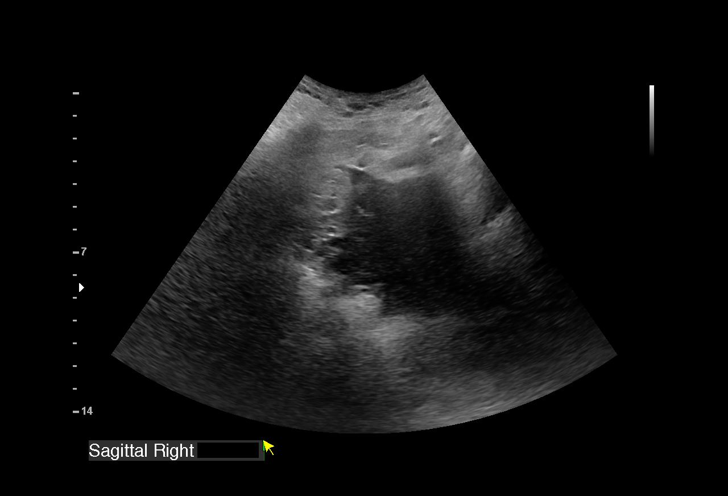
[im 17/73]
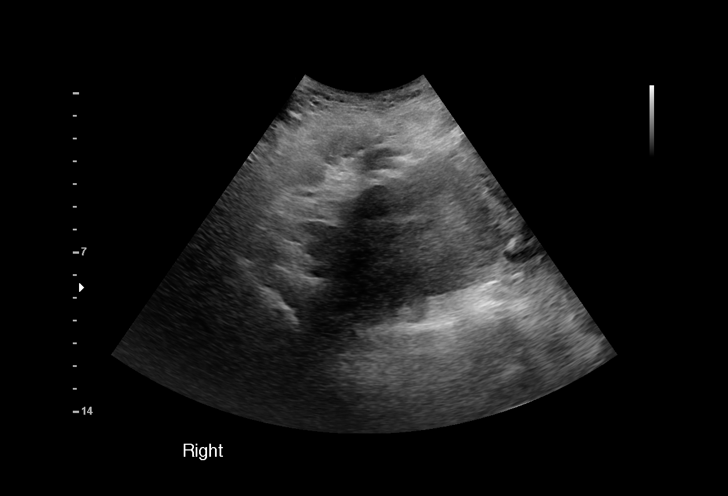
[im 22/73]
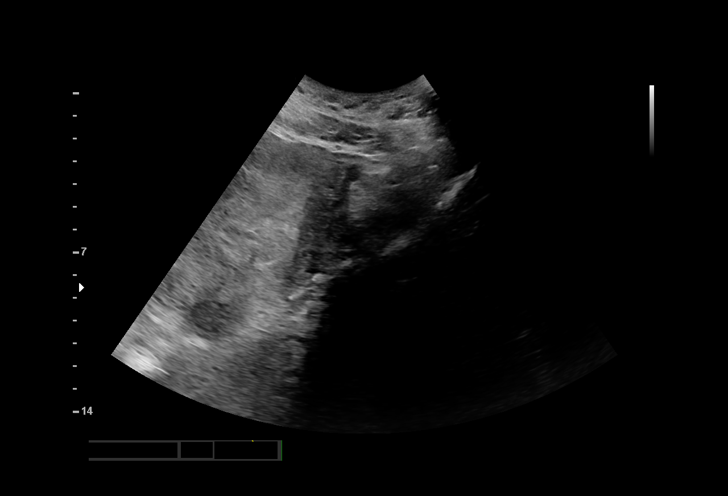
[im 27/73]
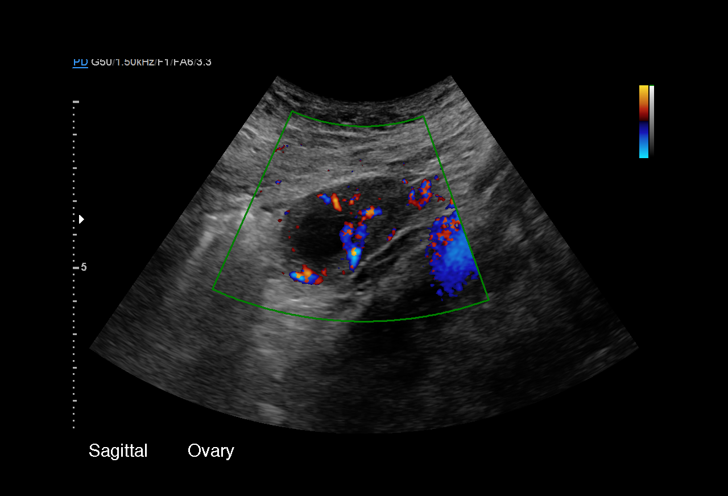
[im 33/73]
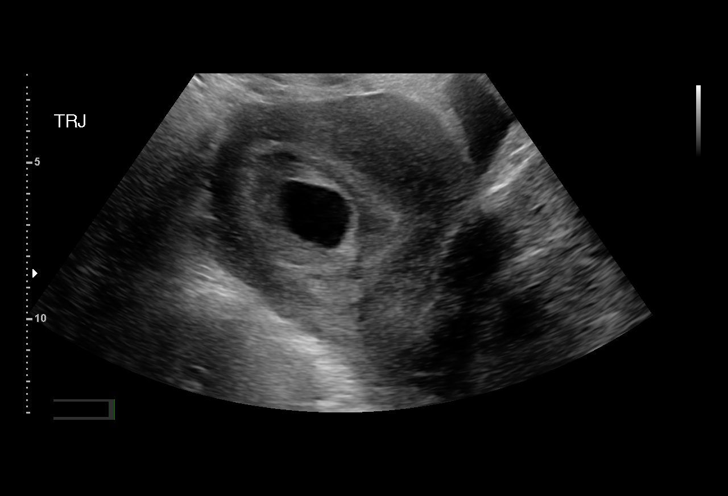
[im 38/73]
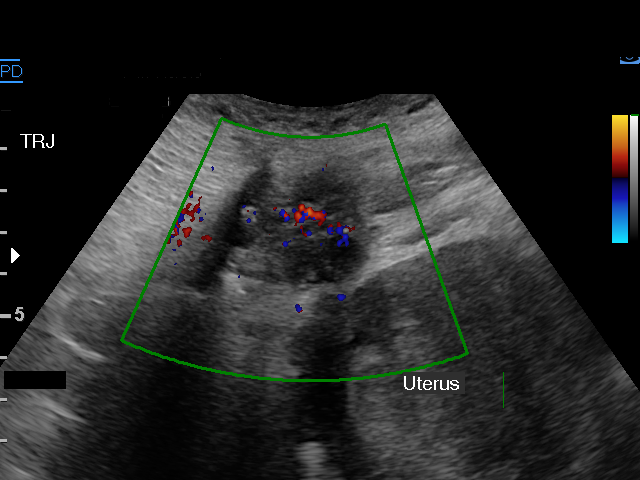
[im 41/73]
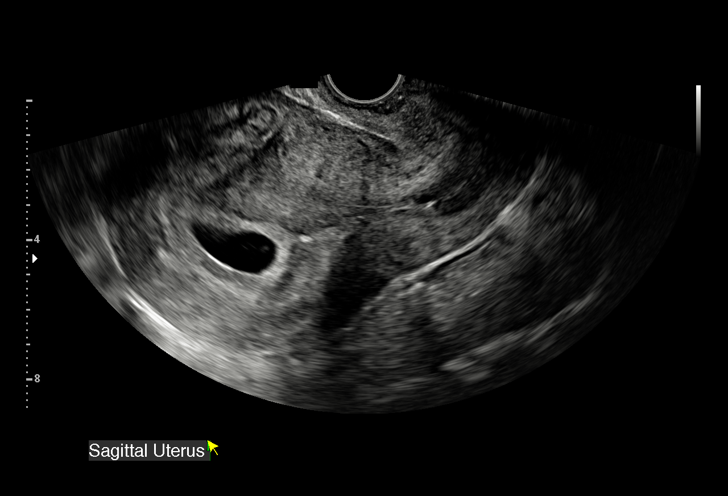
[im 46/73]
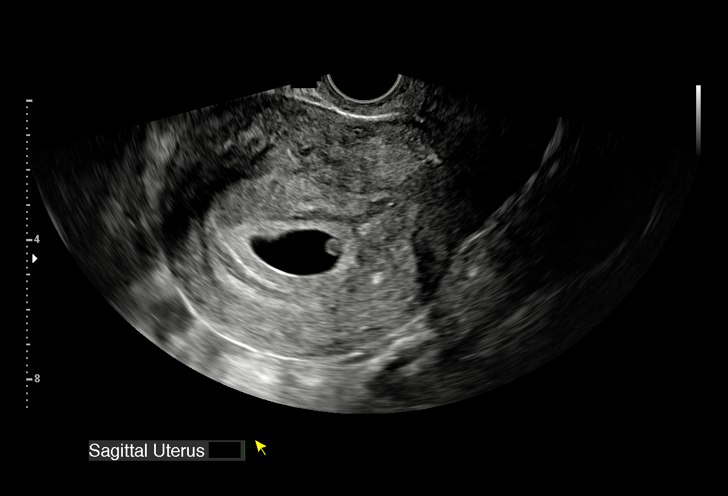
[im 51/73]
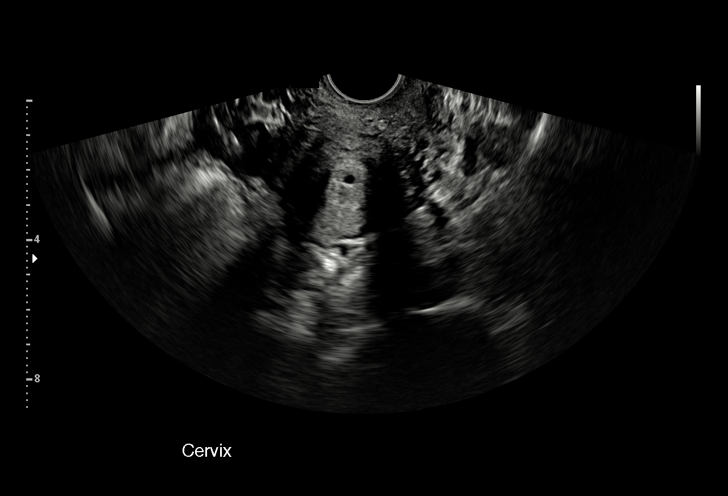
[im 57/73]
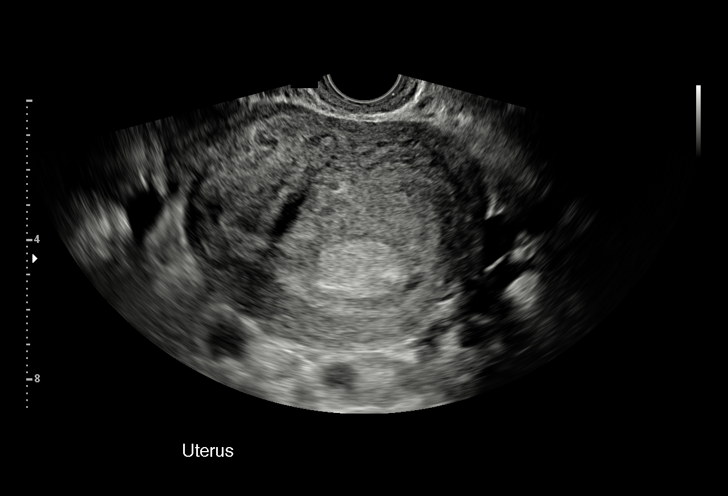
[im 62/73]
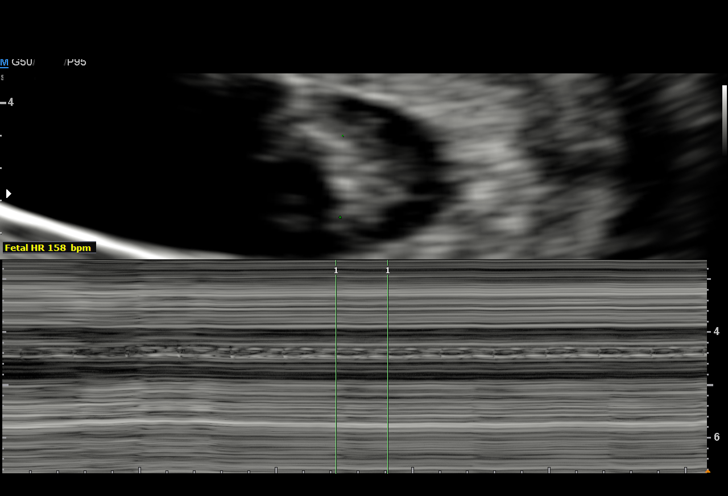
[im 67/73]
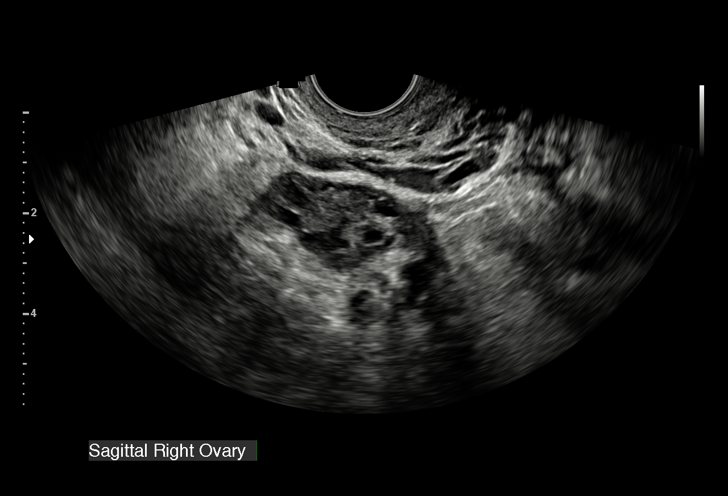
[im 73/73]
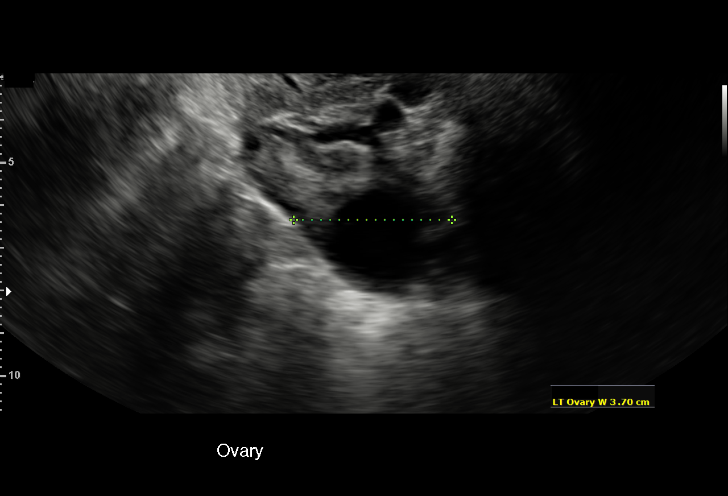

[15 of 28 positions shown; findings below may reference images not displayed]

FINDINGS: Intrauterine gestational sac: Present, single

Yolk sac:  Present

Embryo:  Present

Cardiac Activity: Present

Heart Rate: 158 bpm

CRL:  10.4 mm   7 w   1 d                  US EDC: 09/05/2019

Subchorionic hemorrhage:  None

Maternal uterus/adnexae:

RIGHT ovary normal size and morphology, 3.0 x 1.8 x 2.6 cm.

LEFT ovary measures 5.0 x 2.8 x 3.7 cm and contains a small corpus
luteum.

Additional slightly irregular hypoechoic soft tissue mass identified
superior to the uterus, appears separate from the uterus, 3.9 x
x 3.5 cm in size and demonstrating associated blood flow on color
Doppler imaging.

No free pelvic fluid.
IMPRESSION: Single live intrauterine gestation at 7 weeks 1 day EGA by
crown-rump length.

Irregular heterogeneous generally hypoechoic soft tissue mass
superior to and separate from the uterus measuring 3.9 x 2.8 x
cm in size.

This is atypical in appearance for an endometrioma, though patient
has history of endometriosis.

This could represent an atypical endometrioma or other nonspecific
pelvic soft tissue mass/tumor, and does not appear to possess lymph
node morphology.

Recommend attention at time of follow-up obstetrical ultrasound to
reassess.

If this persists this could be characterized by MR.

These results will be called to the ordering clinician or
representative by the Radiologist Assistant, and communication
documented in the PACS or zVision Dashboard.

## 2020-10-16 ENCOUNTER — Other Ambulatory Visit: Payer: Self-pay

## 2020-10-20 MED ORDER — HYDROCHLOROTHIAZIDE 25 MG PO TABS: 25.0000 mg | ORAL_TABLET | Freq: Every day | ORAL | 0 refills | Status: DC

## 2020-11-07 ENCOUNTER — Ambulatory Visit: Payer: Medicaid Other | Admitting: Student in an Organized Health Care Education/Training Program

## 2020-11-17 ENCOUNTER — Ambulatory Visit: Payer: Medicaid Other | Admitting: Obstetrics and Gynecology

## 2020-12-03 ENCOUNTER — Encounter: Payer: Self-pay | Admitting: Student in an Organized Health Care Education/Training Program

## 2020-12-03 ENCOUNTER — Other Ambulatory Visit: Payer: Self-pay

## 2020-12-03 ENCOUNTER — Ambulatory Visit (INDEPENDENT_AMBULATORY_CARE_PROVIDER_SITE_OTHER): Payer: Medicaid Other | Admitting: Student in an Organized Health Care Education/Training Program

## 2020-12-03 VITALS — BP 120/72 | HR 73 | Wt 190.4 lb

## 2020-12-03 DIAGNOSIS — I10 Essential (primary) hypertension: Secondary | ICD-10-CM | POA: Diagnosis not present

## 2020-12-03 DIAGNOSIS — G8929 Other chronic pain: Secondary | ICD-10-CM | POA: Diagnosis not present

## 2020-12-03 DIAGNOSIS — M25572 Pain in left ankle and joints of left foot: Secondary | ICD-10-CM

## 2020-12-03 DIAGNOSIS — M25579 Pain in unspecified ankle and joints of unspecified foot: Secondary | ICD-10-CM

## 2020-12-03 DIAGNOSIS — M25571 Pain in right ankle and joints of right foot: Secondary | ICD-10-CM | POA: Diagnosis not present

## 2020-12-03 NOTE — Progress Notes (Signed)
    SUBJECTIVE:   CHIEF COMPLAINT / HPI: med refill, foot pain  HTN-adherent with medication. Denies negative side effects. Needing refills. Denies missing doses.  Foot pain- bilateral with gradual worsening starting 1-2 months ago.Denies any past injuries or surgeries to her ankles. 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 No skin changes. Tried compression stocking which did not help. The pain is worse when she has been resting for a while like at night and getting up in the morning. Feels stiff. There has been swelling at the ankles but not now.  OBJECTIVE:   BP 120/72   Pulse 73   Wt 86.4 kg   SpO2 98%   BMI 29.82 kg/m   General: NAD, pleasant, able to participate in exam Cardiac: RRR, normal heart sounds, no murmurs. 2+ radial and PT pulses bilaterally Respiratory: CTAB, normal effort, No wheezes, rales or rhonchi Ankles: Bilateral 1+ non-pitting edema at ankles. No tenderness to palpation of plantar fascia, calcaneus, achilles tendon, malleoli, or ATF. Distal pulses intact. Strength normal bilaterally.  Skin: warm and dry, no rashes noted Neuro: alert and oriented, no focal deficits Psych: Normal affect and mood  ASSESSMENT/PLAN:   Pain in joint, ankle and foot Bilateral ankle pain worsening with swelling occasionally. Worse after periods of rest. History and physical most suspicious for arthritis. Less likely tendonitis, plantar fasciitis - obtaining ankle xrays - recommended ice or heat, voltaren gel - provided with rehab exercises, can consider formal PT if not improving  Essential hypertension, benign Well controlled, asymptomatic. Patient adherent and denies negative side effects.  BMP checked 6 months ago and was normal Refill medications F/u 6 months     Richarda Osmond, [place]

## 2020-12-03 NOTE — Patient Instructions (Signed)
It was a pleasure to see you today!  To summarize our discussion for this visit:  I will refill your medications today. It looks like you have good control of your blood pressure right now.  For your ankle pain, we will get xrays to look for arthritis and take some measures to help you feel better. I recommend trying ice or heat as well as voltaren gel and strengthening exercises.   Some additional health maintenance measures we should update are: Health Maintenance Due  Topic Date Due  . COVID-19 Vaccine (1) Never done  . TETANUS/TDAP  Never done  . INFLUENZA VACCINE  07/20/2020  .   Please return to our clinic to see me in about 1 month.  Call the clinic at [phone] if your symptoms worsen or you have any concerns.   Thank you for allowing me to take part in your care,  Dr. Doristine Mango   Ankle Pain The ankle joint holds your body weight and allows you to move around. Ankle pain can occur on either side or the back of one ankle or both ankles. Ankle pain may be sharp and burning or dull and aching. There may be tenderness, stiffness, redness, or warmth around the ankle. Many things can cause ankle pain, including an injury to the area and overuse of the ankle. Follow these instructions at home: Activity  Rest your ankle as told by your health care provider. Avoid any activities that cause ankle pain.  Do not use the injured limb to support your body weight until your health care provider says that you can. Use crutches as told by your health care provider.  Do exercises as told by your health care provider.  Ask your health care provider when it is safe to drive if you have a brace on your ankle. If you have a brace:  Wear the brace as told by your health care provider. Remove it only as told by your health care provider.  Loosen the brace if your toes tingle, become numb, or turn cold and blue.  Keep the brace clean.  If the brace is not waterproof: ? Do not  let it get wet. ? Cover it with a watertight covering when you take a bath or shower. If you were given an elastic bandage:   Remove it when you take a bath or a shower.  Try not to move your ankle very much, but wiggle your toes from time to time. This helps to prevent swelling.  Adjust the bandage to make it more comfortable if it feels too tight.  Loosen the bandage if you have numbness or tingling in your foot or if your foot turns cold and blue. Managing pain, stiffness, and swelling   If directed, put ice on the painful area. ? If you have a removable brace or elastic bandage, remove it as told by your health care provider. ? Put ice in a plastic bag. ? Place a towel between your skin and the bag. ? Leave the ice on for 20 minutes, 2-3 times a day.  Move your toes often to avoid stiffness and to lessen swelling.  Raise (elevate) your ankle above the level of your heart while you are sitting or lying down. General instructions  Record information about your pain. Writing down the following may be helpful for you and your health care provider: ? How often you have ankle pain. ? Where the pain is located. ? What the pain feels like.  If  treatment involves wearing a prescribed shoe or insole, make sure you wear it correctly and for as long as told by your health care provider.  Take over-the-counter and prescription medicines only as told by your health care provider.  Keep all follow-up visits as told by your health care provider. This is important. Contact a health care provider if:  Your pain gets worse.  Your pain is not relieved with medicines.  You have a fever or chills.  You are having more trouble with walking.  You have new symptoms. Get help right away if:  Your foot, leg, toes, or ankle: ? Tingles or becomes numb. ? Becomes swollen. ? Turns pale or blue. Summary  Ankle pain can occur on either side or the back of one ankle or both ankles.  Ankle  pain may be sharp and burning or dull and aching.  Rest your ankle as told by your health care provider. If told, apply ice to the area.  Take over-the-counter and prescription medicines only as told by your health care provider. This information is not intended to replace advice given to you by your health care provider. Make sure you discuss any questions you have with your health care provider. Document Revised: 03/27/2019 Document Reviewed: 06/14/2018 Elsevier Patient Education  [place].

## 2020-12-04 DIAGNOSIS — M25579 Pain in unspecified ankle and joints of unspecified foot: Secondary | ICD-10-CM | POA: Insufficient documentation

## 2020-12-04 MED ORDER — HYDROCHLOROTHIAZIDE 25 MG PO TABS: 25.0000 mg | ORAL_TABLET | Freq: Every day | ORAL | 2 refills | Status: DC

## 2020-12-04 MED ORDER — AMLODIPINE BESYLATE 10 MG PO TABS: 10.0000 mg | ORAL_TABLET | Freq: Every day | ORAL | 2 refills | Status: DC

## 2020-12-04 NOTE — Assessment & Plan Note (Signed)
Bilateral ankle pain worsening with swelling occasionally. Worse after periods of rest. History and physical most suspicious for arthritis. Less likely tendonitis, plantar fasciitis - obtaining ankle xrays - recommended ice or heat, voltaren gel - provided with rehab exercises, can consider formal PT if not improving

## 2020-12-04 NOTE — Assessment & Plan Note (Addendum)
Well controlled, asymptomatic. Patient adherent and denies negative side effects.  BMP checked 6 months ago and was normal Refill medications F/u 6 months

## 2020-12-15 ENCOUNTER — Ambulatory Visit (INDEPENDENT_AMBULATORY_CARE_PROVIDER_SITE_OTHER): Payer: Medicaid Other | Admitting: Obstetrics and Gynecology

## 2020-12-15 ENCOUNTER — Other Ambulatory Visit (HOSPITAL_COMMUNITY)
Admission: RE | Admit: 2020-12-15 | Discharge: 2020-12-15 | Disposition: A | Discharge status: Home / Self Care | Payer: Medicaid Other | Source: Ambulatory Visit | Attending: Obstetrics and Gynecology | Admitting: Obstetrics and Gynecology

## 2020-12-15 ENCOUNTER — Other Ambulatory Visit: Payer: Self-pay

## 2020-12-15 ENCOUNTER — Encounter: Payer: Self-pay | Admitting: Obstetrics and Gynecology

## 2020-12-15 VITALS — BP 126/71 | HR 66 | Wt 191.2 lb

## 2020-12-15 DIAGNOSIS — Z01419 Encounter for gynecological examination (general) (routine) without abnormal findings: Secondary | ICD-10-CM | POA: Insufficient documentation

## 2020-12-15 NOTE — Progress Notes (Signed)
Pt presents for annual  Pt c/o excessive labia skin Negative pap smear on 10/31/2019

## 2020-12-15 NOTE — Progress Notes (Signed)
Subjective:     Amy Snyder is a 29 y.o. female P1 with LMP 11/19/20 and BMI 29 who is here for a comprehensive physical exam. The patient reports no problems. She is sexually active without contraception. She denies pelvic pain or abnormal discharge. Patient reports the presence of excessive labial skin and desires it surgically corrected. Patient is without any other complaints  Past Medical History:  Diagnosis Date  . BV (bacterial vaginosis)   . Endometriosis    per biopsy done 11/2016  . History of cesarean section, classical 02/14/2019   Classical c section in 2010 d/t footling breech in active labor  . History of preterm delivery 02/14/2019  . Hypertension    Past Surgical History:  Procedure Laterality Date  . CESAREAN SECTION  10/21/2009   Classical c/s    Family History  Problem Relation Age of Onset  . Hypertension Mother   . Diabetes Other   . Diabetes Maternal Grandfather     Social History   Socioeconomic History  . Marital status: Single    Spouse name: Not on file  . Number of children: 1  . Years of education: Not on file  . Highest education level: Not on file  Occupational History  . Occupation: [employer]  Tobacco Use  . Smoking status: Former Smoker    Packs/day: 0.20    Years: 3.00    Pack years: 0.60    Types: Cigarettes    Quit date: 12/21/2019    Years since quitting: 0.9  . Smokeless tobacco: Never Used  . Tobacco comment: smokes socially  Vaping Use  . Vaping Use: Never used  Substance and Sexual Activity  . Alcohol use: Yes    Alcohol/week: 2.0 standard drinks    Types: 2 Shots of liquor per week    Comment: socially  . Drug use: No  . Sexual activity: Yes    Partners: Male    Birth control/protection: None, Condom    Comment: Sometimes  Other Topics Concern  . Not on file  Social History Narrative  . Not on file   Social Determinants of Health   Financial Resource Strain: Not on file  Food Insecurity: Not on file   Transportation Needs: Not on file  Physical Activity: Not on file  Stress: Not on file  Social Connections: Not on file  Intimate Partner Violence: Not on file   Health Maintenance  Topic Date Due  . COVID-19 Vaccine (1) Never done  . TETANUS/TDAP  Never done  . INFLUENZA VACCINE  07/20/2020  . PAP SMEAR-Modifier  10/30/2022  . Hepatitis C Screening  Completed  . HIV Screening  Completed       Review of Systems Pertinent items noted in HPI and remainder of comprehensive ROS otherwise negative.   Objective:  Blood pressure 126/71, pulse 66, weight 191 lb 3.2 oz (86.7 kg).     GENERAL: Well-developed, well-nourished female in no acute distress.  HEENT: Normocephalic, atraumatic. Sclerae anicteric.  NECK: Supple. Normal thyroid.  LUNGS: Clear to auscultation bilaterally.  HEART: Regular rate and rhythm. BREASTS: Symmetric in size. No palpable masses or lymphadenopathy, skin changes, or nipple drainage. ABDOMEN: Soft, nontender, nondistended. No organomegaly. PELVIC: Normal external female genitalia. Vagina is pink and rugated.  Normal discharge. Normal appearing cervix. Uterus is normal in size.  No adnexal mass or tenderness. EXTREMITIES: No cyanosis, clubbing, or edema, 2+ distal pulses.    Assessment:    Healthy female exam.      Plan:  Pap smear collected Vaginal cultures collected Patient declined blood work for STD Patient will be contacted with abnormal results Patient is seeking a second opinion on trimming excess labial skin and will return to see Dr. Earlene Plater See After Visit Summary for Counseling Recommendations

## 2020-12-16 ENCOUNTER — Other Ambulatory Visit: Payer: Self-pay | Admitting: Obstetrics & Gynecology

## 2020-12-16 DIAGNOSIS — B3731 Acute candidiasis of vulva and vagina: Secondary | ICD-10-CM

## 2020-12-16 DIAGNOSIS — N760 Acute vaginitis: Secondary | ICD-10-CM

## 2020-12-16 LAB — CERVICOVAGINAL ANCILLARY ONLY
Bacterial Vaginitis (gardnerella): POSITIVE — AB
Candida Glabrata: NEGATIVE
Candida Vaginitis: POSITIVE — AB
Chlamydia: NEGATIVE
Comment: NEGATIVE
Comment: NEGATIVE
Comment: NEGATIVE
Comment: NEGATIVE
Comment: NEGATIVE
Comment: NORMAL
Neisseria Gonorrhea: NEGATIVE
Trichomonas: NEGATIVE

## 2020-12-16 MED ORDER — CLINDAMYCIN HCL 300 MG PO CAPS: 300.0000 mg | ORAL_CAPSULE | Freq: Two times a day (BID) | ORAL | 0 refills | Status: AC

## 2020-12-16 MED ORDER — FLUCONAZOLE 150 MG PO TABS: 150.0000 mg | ORAL_TABLET | Freq: Once | ORAL | 3 refills | Status: AC

## 2020-12-28 LAB — CYTOLOGY - PAP
Comment: NEGATIVE
Comment: NEGATIVE
Diagnosis: NEGATIVE
HPV 16: NEGATIVE
HPV 18 / 45: NEGATIVE
High risk HPV: POSITIVE — AB

## 2021-01-12 ENCOUNTER — Ambulatory Visit
Admission: RE | Admit: 2021-01-12 | Discharge: 2021-01-12 | Disposition: A | Discharge status: Home / Self Care | Payer: Medicaid Other | Source: Ambulatory Visit | Attending: Family Medicine | Admitting: Family Medicine

## 2021-01-12 DIAGNOSIS — G8929 Other chronic pain: Secondary | ICD-10-CM

## 2021-01-16 ENCOUNTER — Telehealth: Payer: Self-pay | Admitting: Student in an Organized Health Care Education/Training Program

## 2021-01-16 NOTE — Telephone Encounter (Signed)
Patient is calling to check on the status of receiving the results from her xrays. She would like for someone to call her to discuss.   The best call back is [phone].

## 2021-01-17 ENCOUNTER — Encounter: Payer: Self-pay | Admitting: Student in an Organized Health Care Education/Training Program

## 2021-01-20 ENCOUNTER — Ambulatory Visit: Payer: Medicaid Other | Admitting: Obstetrics and Gynecology

## 2021-01-26 ENCOUNTER — Encounter: Payer: Self-pay | Admitting: Obstetrics and Gynecology

## 2021-01-26 ENCOUNTER — Other Ambulatory Visit: Payer: Self-pay

## 2021-01-26 ENCOUNTER — Ambulatory Visit (INDEPENDENT_AMBULATORY_CARE_PROVIDER_SITE_OTHER): Payer: Medicaid Other | Admitting: Obstetrics and Gynecology

## 2021-01-26 VITALS — BP 128/89 | HR 70 | Ht 67.0 in | Wt 194.5 lb

## 2021-01-26 DIAGNOSIS — Z09 Encounter for follow-up examination after completed treatment for conditions other than malignant neoplasm: Secondary | ICD-10-CM | POA: Diagnosis not present

## 2021-01-26 NOTE — Progress Notes (Signed)
30 yo P1 with LMP 01/12/21 returning today to discuss her concerns with her labia. Patient is without any other complaints. She denies pelvic pain or abnormal discharge. Patient is sexually active and denies any complaints  Past Medical History:  Diagnosis Date  . BV (bacterial vaginosis)   . Endometriosis    per biopsy done 11/2016  . History of cesarean section, classical 02/14/2019   Classical c section in 2010 d/t footling breech in active labor  . History of preterm delivery 02/14/2019  . Hypertension    Past Surgical History:  Procedure Laterality Date  . CESAREAN SECTION  10/21/2009   Classical c/s    Family History  Problem Relation Age of Onset  . Hypertension Mother   . Diabetes Other   . Diabetes Maternal Grandfather    Social History   Tobacco Use  . Smoking status: Former Smoker    Packs/day: 0.20    Years: 3.00    Pack years: 0.60    Types: Cigarettes    Quit date: 12/21/2019    Years since quitting: 1.1  . Smokeless tobacco: Never Used  . Tobacco comment: smokes socially  Vaping Use  . Vaping Use: Never used  Substance Use Topics  . Alcohol use: Yes    Alcohol/week: 2.0 standard drinks    Types: 2 Shots of liquor per week    Comment: socially  . Drug use: No   ROS See pertinent in HPI. Blood pressure 128/89, pulse 70, height 5\' 7"  (1.702 m), weight 194 lb 8 oz (88.2 kg), last menstrual period 01/12/2021. GENERAL: Well-developed, well-nourished female in no acute distress.  EXTREMITIES: No cyanosis, clubbing, or edema, 2+ distal pulses.  A/P 30 yo P1 with dislike of labia appearance - Discussed with patient that I did not feel comfortable trimming her labia as they appeared normal in my opinion - Patient advised to seek a second opinion - Patient with normal pap smear in 11/2020 - RTC prn

## 2021-02-01 ENCOUNTER — Other Ambulatory Visit: Payer: Self-pay | Admitting: Obstetrics

## 2021-02-01 DIAGNOSIS — B9689 Other specified bacterial agents as the cause of diseases classified elsewhere: Secondary | ICD-10-CM

## 2021-02-04 ENCOUNTER — Encounter: Payer: Self-pay | Admitting: Obstetrics and Gynecology

## 2021-02-04 ENCOUNTER — Ambulatory Visit (INDEPENDENT_AMBULATORY_CARE_PROVIDER_SITE_OTHER): Payer: Medicaid Other | Admitting: Obstetrics and Gynecology

## 2021-02-04 ENCOUNTER — Other Ambulatory Visit: Payer: Self-pay

## 2021-02-04 DIAGNOSIS — Z01419 Encounter for gynecological examination (general) (routine) without abnormal findings: Secondary | ICD-10-CM

## 2021-02-04 HISTORY — DX: Encounter for gynecological examination (general) (routine) without abnormal findings: Z01.419

## 2021-02-04 NOTE — Progress Notes (Signed)
Pt is in office for 2nd opinion regarding her Labial concerns. Pt was seen by Dr Elly Modena on 01/26/21.

## 2021-02-04 NOTE — Progress Notes (Signed)
[name], female   DOB: 02/22/1991, 30 y.o.   MRN: [id number] Amy Snyder presents for second opinion in regards to labiaplasty. Pt feels that her labial are to large. She denies any pain or discomfort with under garments or during IC.  PE AF VSS Lungs clear Heart RRR Abd soft + BS GU Labia major and minor normal in appearance, < 2 cm in length, normal clitoral hood and clitoris  A/P Normal GYN Exam Pt reassured that labia are normal in size and appearance.

## 2021-03-23 ENCOUNTER — Other Ambulatory Visit: Payer: Self-pay | Admitting: Student in an Organized Health Care Education/Training Program

## 2021-03-31 ENCOUNTER — Other Ambulatory Visit (HOSPITAL_COMMUNITY)
Admission: RE | Admit: 2021-03-31 | Discharge: 2021-03-31 | Disposition: A | Discharge status: Home / Self Care | Payer: Medicaid Other | Source: Ambulatory Visit | Attending: Family Medicine | Admitting: Family Medicine

## 2021-03-31 ENCOUNTER — Other Ambulatory Visit: Payer: Self-pay

## 2021-03-31 ENCOUNTER — Ambulatory Visit (INDEPENDENT_AMBULATORY_CARE_PROVIDER_SITE_OTHER): Payer: Medicaid Other | Admitting: Student in an Organized Health Care Education/Training Program

## 2021-03-31 VITALS — BP 120/60 | HR 69 | Ht 67.0 in | Wt 197.0 lb

## 2021-03-31 DIAGNOSIS — N898 Other specified noninflammatory disorders of vagina: Secondary | ICD-10-CM

## 2021-03-31 DIAGNOSIS — N760 Acute vaginitis: Secondary | ICD-10-CM

## 2021-03-31 DIAGNOSIS — B9689 Other specified bacterial agents as the cause of diseases classified elsewhere: Secondary | ICD-10-CM | POA: Diagnosis not present

## 2021-03-31 DIAGNOSIS — Z9189 Other specified personal risk factors, not elsewhere classified: Secondary | ICD-10-CM

## 2021-03-31 LAB — POCT WET PREP (WET MOUNT)
Clue Cells Wet Prep Whiff POC: POSITIVE
Trichomonas Wet Prep HPF POC: ABSENT

## 2021-03-31 MED ORDER — FLUCONAZOLE 150 MG PO TABS
150.0000 mg | ORAL_TABLET | Freq: Once | ORAL | 0 refills | Status: DC
Start: 2021-03-31 — End: 2021-08-19

## 2021-03-31 MED ORDER — CLINDAMYCIN HCL 300 MG PO CAPS: ORAL_CAPSULE | ORAL | 0 refills | Status: DC

## 2021-03-31 NOTE — Progress Notes (Signed)
   SUBJECTIVE:   CHIEF COMPLAINT / HPI: vaginal discharge  Vaginal discharge- symptoms present for several days including fishy odor and increased white discharge. Denies pruritus. No new sexual contacts. Wants STI testing. Her symptoms are typical of her regular BV infections. Expecting to start next menstrual cycle in the next couple days.  Negative for systemic symptoms.   OBJECTIVE:   BP 120/60   Pulse 69   Ht 5\' 7"  (1.702 m)   Wt 197 lb (89.4 kg)   LMP 03/03/2021 (Approximate)   SpO2 97%   BMI 30.85 kg/m   Physical Exam Vitals and nursing note reviewed. Exam conducted with a chaperone present.  Constitutional:      General: She is not in acute distress.    Appearance: Normal appearance. She is not ill-appearing or toxic-appearing.  HENT:     Head: Normocephalic.  Genitourinary:    General: Normal vulva.     Exam position: Lithotomy position.     Labia:        Right: No rash or lesion.        Left: No rash or lesion.      Vagina: No foreign body. Vaginal discharge (white mucoid, excessive) present. No erythema, tenderness, bleeding or lesions.     Cervix: Normal.  Neurological:     Mental Status: She is alert.    ASSESSMENT/PLAN:   Vaginal discharge Exam and wet prep positive for BV and yeast. Patient notified via mychart. Patient has frequent reocurrance of BV with intolerance to metronidazole.  - clindamycin sent in to pharmacy as well as diflucan - recommended trial of boric acid for prevention. Patient has not tried this yet - HIV/RPR, GC/chlam pending     Richarda Osmond, [place]

## 2021-03-31 NOTE — Patient Instructions (Addendum)
It was a pleasure to see you today!  To summarize our discussion for this visit:  We are testing for vaginal infections today. I will let you know by mychart your results and any treatments needed.   If you are experiencing frequent BV, something that works for some women to prevent it is using boric acid which you can get over the counter from your pharmacy.   Some additional health maintenance measures we should update are: Health Maintenance Due  Topic Date Due  . COVID-19 Vaccine (1) Never done  .   Call the clinic at [phone] if your symptoms worsen or you have any concerns.   Thank you for allowing me to take part in your care,  Dr. Doristine Mango

## 2021-03-31 NOTE — Assessment & Plan Note (Addendum)
Exam and wet prep positive for BV and yeast. Patient notified via mychart. Patient has frequent reocurrance of BV with intolerance to metronidazole.  - clindamycin sent in to pharmacy as well as diflucan - recommended trial of boric acid for prevention. Patient has not tried this yet - HIV/RPR, GC/chlam pending

## 2021-04-01 LAB — CERVICOVAGINAL ANCILLARY ONLY
Chlamydia: NEGATIVE
Comment: NEGATIVE
Comment: NORMAL
Neisseria Gonorrhea: NEGATIVE

## 2021-04-01 LAB — HIV ANTIBODY (ROUTINE TESTING W REFLEX): HIV Screen 4th Generation wRfx: NONREACTIVE

## 2021-04-01 LAB — RPR: RPR Ser Ql: NONREACTIVE

## 2021-05-25 ENCOUNTER — Ambulatory Visit: Payer: Medicaid Other | Admitting: Student in an Organized Health Care Education/Training Program

## 2021-06-02 ENCOUNTER — Ambulatory Visit: Payer: Medicaid Other | Admitting: Student in an Organized Health Care Education/Training Program

## 2021-06-15 ENCOUNTER — Ambulatory Visit (INDEPENDENT_AMBULATORY_CARE_PROVIDER_SITE_OTHER): Payer: Medicaid Other | Admitting: Student in an Organized Health Care Education/Training Program

## 2021-06-15 ENCOUNTER — Other Ambulatory Visit: Payer: Self-pay

## 2021-06-15 ENCOUNTER — Other Ambulatory Visit (HOSPITAL_COMMUNITY)
Admission: RE | Admit: 2021-06-15 | Discharge: 2021-06-15 | Disposition: A | Discharge status: Home / Self Care | Payer: Medicaid Other | Source: Ambulatory Visit | Attending: Family Medicine | Admitting: Family Medicine

## 2021-06-15 VITALS — BP 140/65 | HR 67 | Ht 67.0 in | Wt 194.4 lb

## 2021-06-15 DIAGNOSIS — Z7251 High risk heterosexual behavior: Secondary | ICD-10-CM

## 2021-06-15 DIAGNOSIS — N898 Other specified noninflammatory disorders of vagina: Secondary | ICD-10-CM | POA: Insufficient documentation

## 2021-06-15 LAB — POCT WET PREP (WET MOUNT)
Clue Cells Wet Prep Whiff POC: POSITIVE
Trichomonas Wet Prep HPF POC: ABSENT

## 2021-06-15 LAB — POCT URINE PREGNANCY: Preg Test, Ur: NEGATIVE

## 2021-06-15 MED ORDER — DOXYCYCLINE HYCLATE 100 MG PO TABS
100.0000 mg | ORAL_TABLET | Freq: Two times a day (BID) | ORAL | 0 refills | Status: AC
Start: 2021-06-15 — End: 2021-06-22

## 2021-06-15 NOTE — Progress Notes (Signed)
    SUBJECTIVE:   CHIEF COMPLAINT / HPI: STI screen  STI concerns: Last sexual encounter was about a week ago. No protection. Unknown if partner had infection. She has been experiencing discharge similar to her usual BV symptoms. She also has a draining lesion in her pubic area there for several weeks and has had this in the past. Denies pain. No dysuria. No contraception.  OBJECTIVE:   BP 140/65   Pulse 67   Ht 5\' 7"  (1.702 m)   Wt 194 lb 6.4 oz (88.2 kg)   SpO2 98%   BMI 30.45 kg/m   Physical Exam Vitals and nursing note reviewed. Exam conducted with a chaperone present.  Constitutional:      General: She is not in acute distress.    Appearance: She is not ill-appearing or toxic-appearing.  Pulmonary:     Effort: Pulmonary effort is normal.  Genitourinary:    Exam position: Lithotomy position.     Comments: 3 hair follicles on patients right mons pubis with erythema and white pruritic drainage with manual expression. Some superficial induration palpated deep to the hair follicles which is mildly tender. Pubic hair is shaved. No other external lesions.  White discharge present in vaginal canal with an intact condom in canal. Foul odor. Cervix normal appearance.  Skin:    General: Skin is warm and dry.  Neurological:     Mental Status: She is alert.  Psychiatric:        Mood and Affect: Mood normal.   ASSESSMENT/PLAN:   Vaginal discharge Patient unaware of condom in her vagina and states that it must have been present for a week.  Wet prep today positive for yeast and clue cells. Patient prefers topical treatment.  Due to close proximity of exposure, HIV viral obtained as well as RPR, GC/chlam collected today. Recommended abstaining from unprotected sex until results return. Pregnancy test negative.  In addition to expressing purulent material from follicles on mons pubis which is likely result of ingrown hairs from shaving, starting course of doxycycline for residual  induration. Recommended using clippers instead of razor to help avoid ingrown hairs and making sure to clean shaving products.      [place]

## 2021-06-15 NOTE — Patient Instructions (Signed)
It was a pleasure to see you today!  To summarize our discussion for this visit: Today we are screening for STIs as well as pregnancy. I recommend abstaining from unprotected sex until we have results.  For the infection on your pubic area, likely clogged pore ducts. We will treat with an antibiotic. Please let us know if it worsens or not getting better with treatment.   Some additional health maintenance measures we should update are: Health Maintenance Due  Topic Date Due   COVID-19 Vaccine (1) Never done     Call the clinic at [phone] if your symptoms worsen or you have any concerns.   Thank you for allowing me to take part in your care,  Dr. Doristine Mango

## 2021-06-16 LAB — CERVICOVAGINAL ANCILLARY ONLY
Chlamydia: NEGATIVE
Comment: NEGATIVE
Comment: NORMAL
Neisseria Gonorrhea: NEGATIVE

## 2021-06-17 LAB — RPR: RPR Ser Ql: NONREACTIVE

## 2021-06-17 LAB — HIV-1 RNA QUANT-NO REFLEX-BLD: HIV-1 RNA Viral Load: <20 copies/mL

## 2021-06-17 MED ORDER — METRONIDAZOLE 1.3 % VA GEL: VAGINAL | 0 refills | Status: DC

## 2021-06-17 NOTE — Assessment & Plan Note (Addendum)
Patient unaware of condom in her vagina and states that it must have been present for a week.  Wet prep today positive for yeast and clue cells. Patient prefers topical treatment.  Due to close proximity of exposure, HIV viral obtained as well as RPR, GC/chlam collected today. Recommended abstaining from unprotected sex until results return. Pregnancy test negative.  In addition to expressing purulent material from follicles on mons pubis which is likely result of ingrown hairs from shaving, starting course of doxycycline for residual induration. Recommended using clippers instead of razor to help avoid ingrown hairs and making sure to clean shaving products.

## 2021-07-03 ENCOUNTER — Ambulatory Visit (INDEPENDENT_AMBULATORY_CARE_PROVIDER_SITE_OTHER): Payer: Medicaid Other | Admitting: Family Medicine

## 2021-07-03 ENCOUNTER — Other Ambulatory Visit: Payer: Self-pay

## 2021-07-03 VITALS — BP 139/90 | HR 69 | Ht 67.0 in | Wt 194.0 lb

## 2021-07-03 DIAGNOSIS — I10 Essential (primary) hypertension: Secondary | ICD-10-CM | POA: Diagnosis not present

## 2021-07-03 DIAGNOSIS — R234 Changes in skin texture: Secondary | ICD-10-CM | POA: Diagnosis not present

## 2021-07-03 NOTE — Assessment & Plan Note (Signed)
BP mildly elevated at 139/90. She has not taken her amlodipine or HCTZ yet today. Reports compliance with these medications on a daily basis and has been well controlled at prior office visits. Continue to monitor. If persistently elevated at future visits, may need dose adjustment or additional agent.

## 2021-07-03 NOTE — Patient Instructions (Signed)
It was great to see you today!  Things we discussed at today's visit: - The firm area on your bikini line is likely scar tissue. Hopefully this will improve with time. - If it's getting bigger or you noticed redness, drainage, fever, or other concerns please call our office and we'd be happy to re-evaluate.  Take care,  Dr. [facility]

## 2021-07-03 NOTE — Progress Notes (Signed)
    SUBJECTIVE:   CHIEF COMPLAINT / HPI:   Skin Concern Patient concerned about the skin on her bikini line. In her right inguinal fold she has an area of "hardness". Wonders if it's infected. She was previously seen 3 weeks ago and given Rx for doxycycline due to folliculitis. Patient completed the 7 day course of doxy.  The area has not increased in size. No redness, warmth, or drainage. No fever/chills. There is mild discomfort with firm palpation but otherwise it is not painful.  PERTINENT  PMH / PSH: HTN  OBJECTIVE:   BP 139/90   Pulse 69   Ht 5\' 7"  (1.702 m)   Wt 194 lb (88 kg)   LMP 06/24/2021 (Exact Date)   SpO2 98%   BMI 30.38 kg/m   Gen: alert, well-appearing, NAD Resp: normal effort Abd: soft, nontender GU: 1cm area of induration in right inguinal fold. Nontender, no redness, warmth, or drainage. 2 small pinpoint areas of hypopigmentation where previous pustules drained. Neuro: grossly intact Psych: appropriate affect  ASSESSMENT/PLAN:   Scar Tissue Patient with small area of induration in right inguinal fold consistent with scar tissue. No erythema, warmth, or drainage to suggest cellulitis or abscess.  No indication for antibiotics or additional intervention at this time. Return precautions given.  HTN BP mildly elevated at 139/90. She has not taken her amlodipine or HCTZ yet today. Reports compliance with these medications on a daily basis and has been well controlled at prior office visits. Continue to monitor. If persistently elevated at future visits, may need dose adjustment or additional agent.   Alcus Dad, MD [place]

## 2021-07-14 ENCOUNTER — Ambulatory Visit: Payer: Medicaid Other

## 2021-07-14 ENCOUNTER — Telehealth: Payer: Self-pay

## 2021-07-14 NOTE — Telephone Encounter (Signed)
Pt called to see if Dr. Nita Sells could call her in another script for BV?

## 2021-07-15 NOTE — Telephone Encounter (Signed)
Spoke to patient and she will come in Monday.  Amy Snyder, [place]

## 2021-07-20 ENCOUNTER — Other Ambulatory Visit: Payer: Self-pay

## 2021-07-20 ENCOUNTER — Ambulatory Visit (INDEPENDENT_AMBULATORY_CARE_PROVIDER_SITE_OTHER): Payer: Medicaid Other | Admitting: Family Medicine

## 2021-07-20 DIAGNOSIS — N898 Other specified noninflammatory disorders of vagina: Secondary | ICD-10-CM

## 2021-07-20 NOTE — Addendum Note (Signed)
Addended by: Lavell Anchors A on: 07/20/2021 03:53 PM   Modules accepted: Orders

## 2021-07-20 NOTE — Patient Instructions (Addendum)
It was nice seeing you today!  I will update you with results on MyChart.  Stay well, Zola Button, MD [place] [phone]

## 2021-07-20 NOTE — Addendum Note (Signed)
Addended by: Lavell Anchors A on: 07/20/2021 03:54 PM   Modules accepted: Orders

## 2021-07-20 NOTE — Assessment & Plan Note (Signed)
Wet prep sendout ordered. Possible treatment failure. Will prescribe metronidazole pills if BV still present.

## 2021-07-20 NOTE — Progress Notes (Signed)
    SUBJECTIVE:   CHIEF COMPLAINT / HPI:   Vaginal discharge Reports vaginal discharge that did not improve with BV treatment a few weeks ago. She was treated with single higher-dose metronidazole gel 1.3%. Small amount of discharge still present, no odor noticed. Wants to be tested for BV again. Prefers pills if still has BV. Declines other testing.  PERTINENT  PMH / PSH: HTN  OBJECTIVE:   BP (!) 136/92   Pulse 76   Ht '5\' 7"'$  (1.702 m)   Wt 192 lb 6.4 oz (87.3 kg)   LMP 06/24/2021 (Exact Date)   SpO2 100%   BMI 30.13 kg/m   General: obese young female, NAD Pulm: non-labored respirations  Chaperone Alexis CMA present  ASSESSMENT/PLAN:   Vaginal discharge Wet prep sendout ordered. Possible treatment failure. Will prescribe metronidazole pills if BV still present.     Zola Button, MD [place]

## 2021-07-27 ENCOUNTER — Encounter: Payer: Self-pay | Admitting: Family Medicine

## 2021-07-27 ENCOUNTER — Ambulatory Visit (INDEPENDENT_AMBULATORY_CARE_PROVIDER_SITE_OTHER): Payer: Medicaid Other | Admitting: Family Medicine

## 2021-07-27 ENCOUNTER — Other Ambulatory Visit: Payer: Self-pay | Admitting: Family Medicine

## 2021-07-27 ENCOUNTER — Other Ambulatory Visit: Payer: Self-pay

## 2021-07-27 VITALS — BP 114/72 | HR 83 | Ht 67.0 in | Wt 193.0 lb

## 2021-07-27 DIAGNOSIS — N898 Other specified noninflammatory disorders of vagina: Secondary | ICD-10-CM

## 2021-07-27 DIAGNOSIS — N760 Acute vaginitis: Secondary | ICD-10-CM

## 2021-07-27 DIAGNOSIS — I10 Essential (primary) hypertension: Secondary | ICD-10-CM

## 2021-07-27 DIAGNOSIS — L739 Follicular disorder, unspecified: Secondary | ICD-10-CM

## 2021-07-27 DIAGNOSIS — B9689 Other specified bacterial agents as the cause of diseases classified elsewhere: Secondary | ICD-10-CM

## 2021-07-27 LAB — POCT WET PREP (WET MOUNT)
Clue Cells Wet Prep Whiff POC: POSITIVE
Trichomonas Wet Prep HPF POC: ABSENT

## 2021-07-27 MED ORDER — AMLODIPINE BESYLATE 10 MG PO TABS: 10.0000 mg | ORAL_TABLET | Freq: Every day | ORAL | 3 refills | Status: DC

## 2021-07-27 MED ORDER — CLINDAMYCIN HCL 300 MG PO CAPS: 300.0000 mg | ORAL_CAPSULE | Freq: Two times a day (BID) | ORAL | 0 refills | Status: DC

## 2021-07-27 MED ORDER — MUPIROCIN CALCIUM 2 % EX CREA: 1.0000 "application " | TOPICAL_CREAM | Freq: Two times a day (BID) | CUTANEOUS | 1 refills | Status: DC

## 2021-07-27 NOTE — Progress Notes (Signed)
    SUBJECTIVE:   CHIEF COMPLAINT / HPI:   Rash on vulva.  Did drain small amount of pus at one point.  Sore.  No fever, rash or systemic sx. Maybe has a vag discharge.  Recently tested for STIs and does not feel she needs retested.  Also, certain she is not pregnant. Needs refill of amlodipine.    OBJECTIVE:   BP 114/72   Pulse 83   Ht '5\' 7"'$  (1.702 m)   Wt 193 lb (87.5 kg)   LMP 07/23/2021   SpO2 99%   BMI 30.23 kg/m   Two small pustules on mons.  No underlying abscess. Speculum, scant discharge.  Wet prep consistent withBV  ASSESSMENT/PLAN:   Folliculitis On mons.  Bactroban.  Essential hypertension, benign Good control on current meds.  Refilled amlodipine as requested.    BV (bacterial vaginosis) Flagyl allergic.  Rx clinda     Zenia Resides, MD [place]

## 2021-07-27 NOTE — Assessment & Plan Note (Signed)
On mons.  Bactroban.

## 2021-07-27 NOTE — Patient Instructions (Addendum)
You have folliculitis.  I sent in a prescription for an antibiotic ointment. I will call with test results. I refilled your amlodipine

## 2021-07-27 NOTE — Assessment & Plan Note (Signed)
Good control on current meds.  Refilled amlodipine as requested.

## 2021-07-27 NOTE — Assessment & Plan Note (Signed)
Flagyl allergic.  Rx clinda

## 2021-08-19 ENCOUNTER — Other Ambulatory Visit: Payer: Self-pay | Admitting: Student in an Organized Health Care Education/Training Program

## 2021-09-17 ENCOUNTER — Ambulatory Visit: Payer: Medicaid Other

## 2021-09-17 NOTE — Progress Notes (Deleted)
    SUBJECTIVE:   CHIEF COMPLAINT / HPI:   ***  PERTINENT  PMH / PSH: ***  OBJECTIVE:   There were no vitals taken for this visit.   General: Alert, no acute distress Pelvic Exam chaperoned by CMA **         External: normal female genitalia without lesions or masses        Vagina: normal without lesions or masses        Cervix: normal without lesions or masses            ASSESSMENT/PLAN:   No problem-specific Assessment & Plan notes found for this encounter.     Carollee Leitz, MD [place]

## 2021-09-23 ENCOUNTER — Ambulatory Visit (INDEPENDENT_AMBULATORY_CARE_PROVIDER_SITE_OTHER): Payer: Medicaid Other | Admitting: Family Medicine

## 2021-09-23 ENCOUNTER — Other Ambulatory Visit: Payer: Self-pay

## 2021-09-23 ENCOUNTER — Other Ambulatory Visit (HOSPITAL_COMMUNITY)
Admission: RE | Admit: 2021-09-23 | Discharge: 2021-09-23 | Disposition: A | Discharge status: Home / Self Care | Payer: Medicaid Other | Source: Ambulatory Visit | Attending: Family Medicine | Admitting: Family Medicine

## 2021-09-23 VITALS — BP 130/84 | HR 73 | Wt 189.5 lb

## 2021-09-23 DIAGNOSIS — Z23 Encounter for immunization: Secondary | ICD-10-CM

## 2021-09-23 DIAGNOSIS — Z113 Encounter for screening for infections with a predominantly sexual mode of transmission: Secondary | ICD-10-CM | POA: Diagnosis not present

## 2021-09-23 LAB — POCT WET PREP (WET MOUNT)
Clue Cells Wet Prep Whiff POC: NEGATIVE
Trichomonas Wet Prep HPF POC: ABSENT

## 2021-09-23 NOTE — Progress Notes (Signed)
         SUBJECTIVE:   CHIEF COMPLAINT / HPI:    Amy Snyder is a 30 y.o. female presents for no improvement in vaginal discharge. Says her BV comes and goes. No smell. Has white vaginal discharge. Patient's last menstrual period was 09/14/2021. Would like STD testing today. No known exposures and no symptoms in her partner.   Symptoms: Fever: no Dysuria:no Vaginal bleeding: no Abdomen or Pelvic pain: no Back pain: no Genital sores or ulcers: yes, single lesion on right pelvic crease Pain during sex: no Odor: no  LMP: Patient's last menstrual period was 09/14/2021. Uses no birth control.    PERTINENT  PMH / PSH: reviewed and updated as appropriate   OBJECTIVE:   BP 130/84   Pulse 73   Wt 189 lb 8 oz (86 kg)   LMP 09/14/2021   SpO2 100%   BMI 29.68 kg/m   GEN: well appearing female in no acute distress  CVS: well perfused  RESP: speaking in full sentences without pause  ABD: soft, non-tender, non-distended, no palpable masses  Pelvic exam: lower right inguinal crease with pink with circumlental hyperpigmentation <0.5cm lesion with punctate area of serous drainage, no fluctuance, normal vulva, VAGINA and CERVIX: normal appearing cervix without discharge or lesions, no CMT, ADNEXA: normal adnexa in size, nontender and no masses, WET MOUNT done - results: negative for pathogens, normal epithelial cells, KOH done, exam chaperoned by CMA Tashira.     ASSESSMENT/PLAN:   Vaginal Discharge  Wet prep unremarkable for yeast, BV or trich. Suspect normal physiological discharge. GC/CT pending.  - F/U if symptoms not improving or getting worse.  - Will f/u on G/C Chlamydia and call in Rx if positive.  - Self care instructions given including avoiding douching.   High risk heterosexual behavior Declined HIV and RPR. Discussed hormonal contraception. Pt declined and to make an appointment if she reconsiders.  Advised to use barrier protection.    Lyndee Hensen, [place]

## 2021-09-23 NOTE — Patient Instructions (Signed)
I think vaginal discharge normal and related to your cycle. You can stop by the pharmacy to pick up the topical antibiotics for the area near your vagina.  If you have fever or it starts to grow larger and more painful return to the clinic or urgent care for drainage.   Take Care,  Dr Susa Simmonds

## 2021-09-24 LAB — CERVICOVAGINAL ANCILLARY ONLY
Chlamydia: NEGATIVE
Comment: NEGATIVE
Comment: NORMAL
Neisseria Gonorrhea: NEGATIVE

## 2021-09-30 ENCOUNTER — Telehealth: Payer: Self-pay | Admitting: *Deleted

## 2021-09-30 NOTE — Telephone Encounter (Signed)
Pt was seen on 10/5 and was told that dr would send her some antibiotics in. Not on med list, but in not. Please advise. Ferrell Flam Kennon Holter, CMA

## 2021-10-02 ENCOUNTER — Encounter: Payer: Self-pay | Admitting: Family Medicine

## 2021-10-02 NOTE — Telephone Encounter (Signed)
Patient returns call to nurse line. Patient states that she was needing refill on bactroban ointment for rash. This was prescribed on 8/8. Patient states that she requested refill at appointment on 09/23/21.  Will forward to both PCP and Dr. Susa Simmonds as she saw patient on 10/5.  Talbot Grumbling, RN

## 2021-10-03 MED ORDER — MUPIROCIN CALCIUM 2 % EX CREA: 1.0000 "application " | TOPICAL_CREAM | Freq: Two times a day (BID) | CUTANEOUS | 0 refills | Status: DC

## 2021-10-03 NOTE — Addendum Note (Signed)
Addended by: Lyndee Hensen D on: 10/03/2021 02:25 AM   Modules accepted: Orders

## 2021-12-15 ENCOUNTER — Ambulatory Visit: Payer: Medicaid Other | Admitting: Student

## 2021-12-17 ENCOUNTER — Ambulatory Visit (INDEPENDENT_AMBULATORY_CARE_PROVIDER_SITE_OTHER): Payer: Medicaid Other | Admitting: Family Medicine

## 2021-12-17 ENCOUNTER — Other Ambulatory Visit (HOSPITAL_COMMUNITY)
Admission: RE | Admit: 2021-12-17 | Discharge: 2021-12-17 | Disposition: A | Discharge status: Home / Self Care | Payer: Medicaid Other | Source: Ambulatory Visit | Attending: Family Medicine | Admitting: Family Medicine

## 2021-12-17 ENCOUNTER — Other Ambulatory Visit: Payer: Self-pay

## 2021-12-17 VITALS — BP 151/91 | HR 83 | Ht 67.0 in | Wt 200.4 lb

## 2021-12-17 DIAGNOSIS — Z113 Encounter for screening for infections with a predominantly sexual mode of transmission: Secondary | ICD-10-CM | POA: Diagnosis not present

## 2021-12-17 DIAGNOSIS — L723 Sebaceous cyst: Secondary | ICD-10-CM

## 2021-12-17 DIAGNOSIS — I10 Essential (primary) hypertension: Secondary | ICD-10-CM

## 2021-12-17 DIAGNOSIS — L989 Disorder of the skin and subcutaneous tissue, unspecified: Secondary | ICD-10-CM | POA: Diagnosis not present

## 2021-12-17 NOTE — Patient Instructions (Addendum)
I think the lesion on your right pelvic region is a sebaceous cyst with scar tissue over top.  We do not need to treat this with anything at this time.  If it becomes very irritated we can remove this though this would likely take a few stitches.  You do not need antibiotics currently.  If it becomes irritated I recommend that you follow back up with [place] at that time and we can consider if the surgery is necessary.  We are screening for STDs today including gonorrhea, chlamydia, trichomonas, HIV, syphilis.  I will send a MyChart message if these are normal or call you if we need to start any treatments.  Your blood pressure is a bit high today.  I would like for you to continue taking your blood pressure medications but also check your blood pressures daily for the next 1 week.  I would like for you make a 1 week follow-up with myself or another physician for a blood pressure check so we can see if we need to make any changes to your medications.

## 2021-12-17 NOTE — Progress Notes (Signed)
° ° °  SUBJECTIVE:   CHIEF COMPLAINT / HPI:   Screening for STD Patient is a 30 y.o. female presenting for STD screening.  She denies any vaginal itching or vaginal discharge.  She would like to be tested for gonorrhea/chlamydia/trichomonas/HIV/syphilis.    Skin lesion: Previously seen on 07/03/2021 with diagnosis of scar tissue in the area and a small amount of induration in the right inguinal fold with no erythema, warmth, drainage and no indication for antibiotics.  It does appear that she received antibiotics for this area about 3 weeks prior with doxycycline for concern of folliculitis.  PERTINENT  PMH / PSH: None relevant  OBJECTIVE:   BP (!) 151/91    Pulse 83    Ht 5\' 7"  (1.702 m)    Wt 200 lb 6.4 oz (90.9 kg)    LMP 12/03/2021 (Exact Date)    SpO2 99%    BMI 31.39 kg/m    BP recheck 141/90   General: NAD, pleasant, able to participate in exam Respiratory: Normal effort, no obvious respiratory distress Pelvic: VULVA: normal appearing vulva with no masses, tenderness or lesions, VAGINA: Normal appearing vagina with normal color, no lesions, with copious and clear discharge present Right inguinal area with a single 1 cm hardened scarlike lesion with no surrounding erythema, no opening/drainage in the skin, no palpable abscess noted underneath. Chaperone April present for pelvic exam  ASSESSMENT/PLAN:   Skin lesion: Present on the right inguinal area.  Was previously evaluated with folliculitis and received a topical antibiotic.  Since then she noted scar tissue which seems to swell and then retract over time.  At times it becomes irritated and itchy but is not so today.  Is not draining anything currently and is not warm.  She denies any fevers.  No erythema in the area.  Evaluate the lesion alongside Dr. Nori Riis who agreed that this was most likely a sebaceous cyst underneath a scar.  Recommended no intervention unless it became irritating but that we could remove it at that time though  this would likely take a few stitches.  [place] for antibiotics at this time.  STD screening: 30 y.o. female presenting for STD screening.  She denies any vaginal discharge, vaginal itching, vaginal odor.  She would like be tested for gonorrhea/chlamydia/trichomonas as well as HIV and syphilis. Plan: -GC/chlamydia/trichomonas pending -Will check HIV and RPR  Hypertension: Takes HCTZ 25 mg daily as well as amlodipine 10 mg daily.  She does have a blood pressure cuff at home.  Previous office visit on 07/27/2021 with blood pressure of 114/72.  Recommended that she check her blood pressures at home over the next week and make a follow-up appointment for 1 week for blood pressure recheck.  We may need to make blood pressure modifications if this number continues to be elevated.  Lurline Del, [place]

## 2021-12-18 LAB — CERVICOVAGINAL ANCILLARY ONLY
Bacterial Vaginitis (gardnerella): NEGATIVE
Candida Glabrata: NEGATIVE
Candida Vaginitis: NEGATIVE
Chlamydia: NEGATIVE
Comment: NEGATIVE
Comment: NEGATIVE
Comment: NEGATIVE
Comment: NEGATIVE
Comment: NEGATIVE
Comment: NORMAL
Neisseria Gonorrhea: NEGATIVE
Trichomonas: NEGATIVE

## 2021-12-18 LAB — HIV ANTIBODY (ROUTINE TESTING W REFLEX): HIV Screen 4th Generation wRfx: NONREACTIVE

## 2021-12-18 LAB — RPR: RPR Ser Ql: NONREACTIVE

## 2022-01-12 ENCOUNTER — Other Ambulatory Visit: Payer: Self-pay

## 2022-01-13 MED ORDER — HYDROCHLOROTHIAZIDE 25 MG PO TABS: 25.0000 mg | ORAL_TABLET | Freq: Every day | ORAL | 2 refills | Status: DC

## 2022-01-24 NOTE — Progress Notes (Signed)
° ° °  SUBJECTIVE:   CHIEF COMPLAINT / HPI:   Blood Pressure F/U Amy Snyder is a 31 y.o. female who presents to the clinic today for follow up on her blood pressure. Current medications include HCTZ 25 mg daily, amlodipine 10 mg daily. Reports good compliance. Last office BP from Dec 2022 was elevated at 151/91.  At that time, patient was recommended to take home blood pressures consistently for 1 week and return for follow-up.  Reports that she takes home blood pressure on occasion, they have been "in the reds" but lately they have been "in the yellow" and "one time it was normal and that day I went on a 15 minute walk". Reports poor diet. Eats out about twice a week. Mixture of prepackaged/frozen meals and cooking. Uses Himalayan salt. Denies chest pain, shortness of breath, and vision changes.  Slight swelling of ankles on occasion- wears compression socks. Headaches occasionally, she feels when her pressures are up.   Neck Pain Reports for the last 2 weeks has had some neck pain, "like I slept wrong". Feels like she has limited range of motion when turning head.  PERTINENT  PMH / PSH:  Past Medical History:  Diagnosis Date   BV (bacterial vaginosis)    Endometriosis    per biopsy done 11/2016   History of cesarean section, classical 02/14/2019   Classical c section in 2010 d/t footling breech in active labor   History of preterm delivery 02/14/2019   Hypertension     OBJECTIVE:   BP 134/88    Pulse 69    Wt 197 lb 3.2 oz (89.4 kg)    SpO2 100%    BMI 30.89 kg/m   Vitals:   01/25/22 1556 01/25/22 1606  BP: (!) 140/91 134/88  Pulse:    SpO2:     General: NAD, pleasant, able to participate in exam HEENT: Normocephalic, tenderness on palpation to b/l SCM muscles, full active ROM with rotation despite discomfort Respiratory: Breathing comfortably on room air Extremities: no edema or cyanosis. Skin: warm and dry, no rashes noted Psych: Normal affect and mood  ASSESSMENT/PLAN:    Essential hypertension, benign Initial blood pressure 140/91, improved 134/88 on recheck.  I am unable to review her home blood pressure readings, however it appears that they are mostly out of range.  We discussed the importance of lifestyle modifications to help in the long-term with overall health and to help improve her blood pressure.  She seems motivated to start making dietary changes and to start exercising.  She is not happy about the idea to add additional medication, but amenable to this, and also motivated to not stay on medications long-term. She is generally asymptomatic, however reports occasional headaches that she believes is related to her blood pressure, and some ankle swelling. -Continue amlodipine 10 mg daily (refilled today) -Continue HCTZ 25 mg daily -Add losartan 50 mg daily -Follow-up appointment made with me on February 21 for blood pressure follow-up.  Should have BMP collected at that time. Urged patient to bring log with her.   Sternocleidomastoid muscle tenderness Had slight tenderness on palpation bilaterally.  Still has full active range of motion despite the discomfort. -Can use heat pads to help with discomfort      [name], [place]

## 2022-01-24 NOTE — Patient Instructions (Addendum)
It was wonderful to see you today.  Please bring ALL of your medications with you to every visit.   Today we talked about:  -Your blood pressure was still a little elevated today. Because it seems like your readings at home are also elevated, I am adding another medication called Losartan. Please come back to see me in 2 weeks for another check and for blood work, please bring your blood pressure log with you next time.  -We discussed lifestyle methods to also help with your blood pressure (so we can get you off medications!). These include weight loss, exercise (150 minutes/week) and limiting salt intake.  -For your neck, use a heating pad to help the muscles.   Thank you for choosing [facility].   Please call [phone] with any questions about today's appointment.  Please be sure to schedule follow up at the front  desk before you leave today.   [name], DO PGY-2 Family Medicine

## 2022-01-25 ENCOUNTER — Encounter: Payer: Self-pay | Admitting: Family Medicine

## 2022-01-25 ENCOUNTER — Ambulatory Visit (INDEPENDENT_AMBULATORY_CARE_PROVIDER_SITE_OTHER): Payer: Medicaid Other | Admitting: Family Medicine

## 2022-01-25 ENCOUNTER — Other Ambulatory Visit: Payer: Self-pay

## 2022-01-25 DIAGNOSIS — I10 Essential (primary) hypertension: Secondary | ICD-10-CM | POA: Diagnosis present

## 2022-01-25 DIAGNOSIS — M7912 Myalgia of auxiliary muscles, head and neck: Secondary | ICD-10-CM | POA: Insufficient documentation

## 2022-01-25 HISTORY — DX: Myalgia of auxiliary muscles, head and neck: M79.12

## 2022-01-25 MED ORDER — AMLODIPINE BESYLATE 10 MG PO TABS: 10.0000 mg | ORAL_TABLET | Freq: Every day | ORAL | 3 refills | Status: DC

## 2022-01-25 MED ORDER — LOSARTAN POTASSIUM 50 MG PO TABS: 50.0000 mg | ORAL_TABLET | Freq: Every day | ORAL | 0 refills | Status: DC

## 2022-01-25 NOTE — Assessment & Plan Note (Addendum)
Initial blood pressure 140/91, improved 134/88 on recheck.  I am unable to review her home blood pressure readings, however it appears that they are mostly out of range.  We discussed the importance of lifestyle modifications to help in the long-term with overall health and to help improve her blood pressure.  She seems motivated to start making dietary changes and to start exercising.  She is not happy about the idea to add additional medication, but amenable to this, and also motivated to not stay on medications long-term. She is generally asymptomatic, however reports occasional headaches that she believes is related to her blood pressure, and some ankle swelling. -Continue amlodipine 10 mg daily (refilled today) -Continue HCTZ 25 mg daily -Add losartan 50 mg daily -Follow-up appointment made with me on February 21 for blood pressure follow-up.  Should have BMP collected at that time. Urged patient to bring log with her.

## 2022-01-25 NOTE — Assessment & Plan Note (Signed)
Had slight tenderness on palpation bilaterally.  Still has full active range of motion despite the discomfort. -Can use heat pads to help with discomfort

## 2022-02-08 NOTE — Patient Instructions (Addendum)
It was wonderful to see you today.  Please bring ALL of your medications with you to every visit.   Today we talked about:  -Your blood pressure today was improved! It was 125/84, our goal is <130/80.  -Medication changes: None. Continue your efforts to exercise and lose weight, this will not only help your blood pressure but your overall health! -We are checking labs today to check on your electrolytes and kidney function, as well as screening for sexually transmitted infections and BV/yeast infection. I will let you know the results via MyChart. -See below for additional information on contraceptive options.  It is important to use barrier protection in the interim to protect yourself against sexually transmitted infections.  We also discussed that one of your blood pressure medications would not be safe in pregnancy. I would encourage you to take prenatal vitamins.  Thank you for choosing [facility].   Please call [phone] with any questions about today's appointment.  Please be sure to schedule follow up at the front  desk before you leave today.   [name], DO PGY-2 Family Medicine    Contraception Choices Contraception, also called birth control, refers to methods or devices that prevent pregnancy. Hormonal methods Contraceptive implant A contraceptive implant is a thin, plastic tube that contains a hormone that prevents pregnancy. It is different from an intrauterine device (IUD). It is inserted into the upper part of the arm by a health care provider. Implants can be effective for up to 3 years. Progestin-only injections Progestin-only injections are injections of progestin, a synthetic form of the hormone progesterone. They are given every 3 months by a health care provider. Birth control pills Birth control pills are pills that contain hormones that prevent pregnancy. They must be taken once a day, preferably at the same time each day. A  prescription is needed to use this method of contraception. Birth control patch The birth control patch contains hormones that prevent pregnancy. It is placed on the skin and must be changed once a week for three weeks and removed on the fourth week. A prescription is needed to use this method of contraception. Vaginal ring A vaginal ring contains hormones that prevent pregnancy. It is placed in the vagina for three weeks and removed on the fourth week. After that, the process is repeated with a new ring. A prescription is needed to use this method of contraception. Emergency contraceptive Emergency contraceptives prevent pregnancy after unprotected sex. They come in pill form and can be taken up to 5 days after sex. They work best the sooner they are taken after having sex. Most emergency contraceptives are available without a prescription. This method should not be used as your only form of birth control. Barrier methods Female condom A female condom is a thin sheath that is worn over the penis during sex. Condoms keep sperm from going inside a woman's body. They can be used with a sperm-killing substance (spermicide) to increase their effectiveness. They should be thrown away after one use. Female condom A female condom is a soft, loose-fitting sheath that is put into the vagina before sex. The condom keeps sperm from going inside a woman's body. They should be thrown away after one use. Diaphragm A diaphragm is a soft, dome-shaped barrier. It is inserted into the vagina before sex, along with a spermicide. The diaphragm blocks sperm from entering the uterus, and the spermicide kills sperm. A diaphragm should be left in the vagina for 6-8 hours  after sex and removed within 24 hours. A diaphragm is prescribed and fitted by a health care provider. A diaphragm should be replaced every 1-2 years, after giving birth, after gaining more than 15 lb (6.8 kg), and after pelvic surgery. Cervical cap A cervical  cap is a round, soft latex or plastic cup that fits over the cervix. It is inserted into the vagina before sex, along with spermicide. It blocks sperm from entering the uterus. The cap should be left in place for 6-8 hours after sex and removed within 48 hours. A cervical cap must be prescribed and fitted by a health care provider. It should be replaced every 2 years. Sponge A sponge is a soft, circular piece of polyurethane foam with spermicide in it. The sponge helps block sperm from entering the uterus, and the spermicide kills sperm. To use it, you make it wet and then insert it into the vagina. It should be inserted before sex, left in for at least 6 hours after sex, and removed and thrown away within 30 hours. Spermicides Spermicides are chemicals that kill or block sperm from entering the cervix and uterus. They can come as a cream, jelly, suppository, foam, or tablet. A spermicide should be inserted into the vagina with an applicator at least 65-46 minutes before sex to allow time for it to work. The process must be repeated every time you have sex. Spermicides do not require a prescription. Intrauterine contraception Intrauterine device (IUD) An IUD is a T-shaped device that is put in a woman's uterus. There are two types: Hormone IUD.This type contains progestin, a synthetic form of the hormone progesterone. This type can stay in place for 3-5 years. Copper IUD.This type is wrapped in copper wire. It can stay in place for 10 years. Permanent methods of contraception Female tubal ligation In this method, a woman's fallopian tubes are sealed, tied, or blocked during surgery to prevent eggs from traveling to the uterus. Hysteroscopic sterilization In this method, a small, flexible insert is placed into each fallopian tube. The inserts cause scar tissue to form in the fallopian tubes and block them, so sperm cannot reach an egg. The procedure takes about 3 months to be effective. Another form of  birth control must be used during those 3 months. Female sterilization This is a procedure to tie off the tubes that carry sperm (vasectomy). After the procedure, the man can still ejaculate fluid (semen). Another form of birth control must be used for 3 months after the procedure. Natural planning methods Natural family planning In this method, a couple does not have sex on days when the woman could become pregnant. Calendar method In this method, the woman keeps track of the length of each menstrual cycle, identifies the days when pregnancy can happen, and does not have sex on those days. Ovulation method In this method, a couple avoids sex during ovulation. Symptothermal method This method involves not having sex during ovulation. The woman typically checks for ovulation by watching changes in her temperature and in the consistency of cervical mucus. Post-ovulation method In this method, a couple waits to have sex until after ovulation. Where to find more information Centers for Disease Control and Prevention: http://www.wolf.info/ Summary Contraception, also called birth control, refers to methods or devices that prevent pregnancy. Hormonal methods of contraception include implants, injections, pills, patches, vaginal rings, and emergency contraceptives. Barrier methods of contraception can include female condoms, female condoms, diaphragms, cervical caps, sponges, and spermicides. There are two types of  IUDs (intrauterine devices). An IUD can be put in a woman's uterus to prevent pregnancy for 3-5 years. Permanent sterilization can be done through a procedure for males and females. Natural family planning methods involve nothaving sex on days when the woman could become pregnant. This information is not intended to replace advice given to you by your health care provider. Make sure you discuss any questions you have with your health care provider. Document Revised: 05/12/2020 Document Reviewed:  05/12/2020 Elsevier Patient Education  [place].

## 2022-02-08 NOTE — Progress Notes (Addendum)
° ° °  SUBJECTIVE:   CHIEF COMPLAINT / HPI:   Blood Pressure F/U Amy Snyder is a 31 y.o. female who presents to the clinic today for follow up on her blood pressure. She was last seen in clinic on 2/6, BP was initially elevated to 140/91 then improved to 134/88 at that time. Current medications include Amlodipine 10 mg, HCTZ 25 mg, Losartan 50 mg. Reports good compliance. Home blood pressure readings range 270-786 systolic and 75-44 diastolic. Denies chest pain, shortness of breath, lower extremity edema, headaches, and vision changes.       Vaginal Discharge  Started having some discharge yesterday, states it feels that she has a yeast infection- it is mildly itchy.  She also has a history of BV but she didn't notice any abnormal smell.  She does have allergy to Flagyl and typically takes clindamycin for this.  She did have unprotected Would also like to be tested for STI.   PERTINENT  PMH / PSH:  Past Medical History:  Diagnosis Date   BV (bacterial vaginosis)    Endometriosis    per biopsy done 11/2016   History of cesarean section, classical 02/14/2019   Classical c section in 2010 d/t footling breech in active labor   History of preterm delivery 02/14/2019   Hypertension     OBJECTIVE:   BP 125/84    Pulse 77    Ht 5\' 7"  (1.702 m)    Wt 201 lb (91.2 kg)    LMP 01/27/2022    SpO2 100%    BMI 31.48 kg/m    General: NAD, pleasant, able to participate in exam Cardiac: RRR, no murmurs. Respiratory: CTAB, normal effort, No wheezes, rales or rhonchi GU: Normal appearance of labia majora and minora, without lesions. Vagina tissue pink, moist, without lesions or abrasions. There is white, thin discharge at vaginal vault. Cervix normal appearance, non-friable, with thin white discharge appreciated. Extremities: mild non-pitting edema localized to b/l ankles Skin: warm and dry, no rashes noted Psych: Normal affect and mood  GU exam chaperoned by Lavell Anchors, CMA ASSESSMENT/PLAN:    Essential hypertension, benign Tolerating medications without adverse effects.  BP much improved.  Will not adjust medications at this time but will encouraged continued lifestyle modifications. Discussed teratogenicity of ARB and pregnancy, patient is aware. Recommended barrier protection and provided with contraception options as further described below.  BMP today.   Vaginal discharge Started yesterday.  Reports that she did have unprotected intercourse this weekend. Given that she is on teratogenic medications- will perform pregnancy test. Discussed contraceptive options- patient is hesitant as she has not had good experiences with OCP and Depo. She is also hesitant about Nexplanon and IUD placement but will think about it. Encouraged her to take prenatal vitamins and barrier protection discussed how ARB is contraindicated in pregnancy. -Wet prep positive for BV, given Flagyl allergy, will give vaginal 2% clindamycin x 7 days.  -GC/Chlamydia, RPR, HIV, Hep B tested, follow up and treat as indicated -UPT negative      [name], [place]

## 2022-02-10 ENCOUNTER — Other Ambulatory Visit: Payer: Self-pay | Admitting: Family Medicine

## 2022-02-10 ENCOUNTER — Encounter: Payer: Self-pay | Admitting: Family Medicine

## 2022-02-10 ENCOUNTER — Ambulatory Visit (INDEPENDENT_AMBULATORY_CARE_PROVIDER_SITE_OTHER): Payer: Medicaid Other | Admitting: Family Medicine

## 2022-02-10 ENCOUNTER — Other Ambulatory Visit (HOSPITAL_COMMUNITY)
Admission: RE | Admit: 2022-02-10 | Discharge: 2022-02-10 | Disposition: A | Discharge status: Home / Self Care | Payer: Medicaid Other | Source: Ambulatory Visit | Attending: Family Medicine | Admitting: Family Medicine

## 2022-02-10 ENCOUNTER — Other Ambulatory Visit: Payer: Self-pay

## 2022-02-10 VITALS — BP 125/84 | HR 77 | Ht 67.0 in | Wt 201.0 lb

## 2022-02-10 DIAGNOSIS — Z7251 High risk heterosexual behavior: Secondary | ICD-10-CM

## 2022-02-10 DIAGNOSIS — I10 Essential (primary) hypertension: Secondary | ICD-10-CM

## 2022-02-10 DIAGNOSIS — N898 Other specified noninflammatory disorders of vagina: Secondary | ICD-10-CM | POA: Insufficient documentation

## 2022-02-10 LAB — POCT WET PREP (WET MOUNT)
Clue Cells Wet Prep Whiff POC: POSITIVE
Trichomonas Wet Prep HPF POC: ABSENT

## 2022-02-10 LAB — POCT URINE PREGNANCY: Preg Test, Ur: NEGATIVE

## 2022-02-10 MED ORDER — CLINDAMYCIN PHOSPHATE (1 DOSE) 2 % VA CREA: 1.0000 "application " | TOPICAL_CREAM | Freq: Every day | VAGINAL | 0 refills | Status: DC

## 2022-02-10 MED ORDER — CLINDESSE 2 % VA CREA: 1.0000 | TOPICAL_CREAM | Freq: Every day | VAGINAL | 0 refills | Status: AC

## 2022-02-10 NOTE — Assessment & Plan Note (Addendum)
Tolerating medications without adverse effects.  BP much improved.  Will not adjust medications at this time but will encouraged continued lifestyle modifications. Discussed teratogenicity of ARB and pregnancy, patient is aware. Recommended barrier protection and provided with contraception options as further described below.  BMP today.

## 2022-02-10 NOTE — Assessment & Plan Note (Addendum)
Started yesterday.  Reports that she did have unprotected intercourse this weekend. Given that she is on teratogenic medications- will perform pregnancy test. Discussed contraceptive options- patient is hesitant as she has not had good experiences with OCP and Depo. She is also hesitant about Nexplanon and IUD placement but will think about it. Encouraged her to take prenatal vitamins and barrier protection discussed how ARB is contraindicated in pregnancy. -Wet prep positive for BV, given Flagyl allergy, will give vaginal 2% clindamycin x 7 days.  -GC/Chlamydia, RPR, HIV, Hep B tested, follow up and treat as indicated -UPT negative

## 2022-02-11 LAB — BASIC METABOLIC PANEL
BUN/Creatinine Ratio: 11 (ref 9–23)
BUN: 8 mg/dL (ref 6–20)
CO2: 26 mmol/L (ref 20–29)
Calcium: 9.3 mg/dL (ref 8.7–10.2)
Chloride: 96 mmol/L (ref 96–106)
Creatinine, Ser: 0.7 mg/dL (ref 0.57–1.00)
Glucose: 144 mg/dL — ABNORMAL HIGH (ref 70–99)
Potassium: 3.5 mmol/L (ref 3.5–5.2)
Sodium: 136 mmol/L (ref 134–144)
eGFR: 119 mL/min/{1.73_m2} (ref >59)

## 2022-02-11 LAB — CERVICOVAGINAL ANCILLARY ONLY
Chlamydia: NEGATIVE
Comment: NEGATIVE
Comment: NEGATIVE
Comment: NORMAL
Neisseria Gonorrhea: NEGATIVE
Trichomonas: NEGATIVE

## 2022-02-11 LAB — HIV ANTIBODY (ROUTINE TESTING W REFLEX): HIV Screen 4th Generation wRfx: NONREACTIVE

## 2022-02-11 LAB — HEPATITIS B SURFACE ANTIGEN: Hepatitis B Surface Ag: NEGATIVE

## 2022-02-11 LAB — RPR: RPR Ser Ql: NONREACTIVE

## 2022-02-15 ENCOUNTER — Other Ambulatory Visit (HOSPITAL_COMMUNITY)
Admission: RE | Admit: 2022-02-15 | Discharge: 2022-02-15 | Disposition: A | Discharge status: Home / Self Care | Payer: Medicaid Other | Source: Ambulatory Visit | Attending: Obstetrics and Gynecology | Admitting: Obstetrics and Gynecology

## 2022-02-15 ENCOUNTER — Other Ambulatory Visit: Payer: Self-pay

## 2022-02-15 ENCOUNTER — Ambulatory Visit (INDEPENDENT_AMBULATORY_CARE_PROVIDER_SITE_OTHER): Payer: Medicaid Other | Admitting: Obstetrics and Gynecology

## 2022-02-15 VITALS — BP 138/94 | HR 72 | Wt 198.2 lb

## 2022-02-15 DIAGNOSIS — Z124 Encounter for screening for malignant neoplasm of cervix: Secondary | ICD-10-CM

## 2022-02-15 DIAGNOSIS — L732 Hidradenitis suppurativa: Secondary | ICD-10-CM

## 2022-02-15 NOTE — Progress Notes (Signed)
Pt presents for possible ingrown hair. Pt was seen at her PCP on 2/22 and received STD testing.

## 2022-02-16 DIAGNOSIS — L732 Hidradenitis suppurativa: Secondary | ICD-10-CM | POA: Insufficient documentation

## 2022-02-16 NOTE — Progress Notes (Signed)
Obstetrics and Gynecology Visit Return Patient Evaluation  Appointment Date: 02/15/2022  Primary Care Provider: [name]  OBGYN Clinic: [facility]  Chief Complaint: persistent right inguinal bump  History of Present Illness:  Amy Snyder is a 31 y.o. with above CC, LMP yesterday.  She states she's had a bump in the left genital area that waxes and wanes and that will burst but it never completely goes away. S/s started about mid of last year. She does shave. She states she went to her PCP that gave her a cream but not sure if it really helped.  She denies any similar s/s in the axillary areas but she had similar bumps in a few spots in the genital area that came and went away completely.   Review of Systems: as noted in the History of Present Illness.   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 Current Outpatient Medications  Medication Sig Dispense Refill   amLODipine (NORVASC) 10 MG tablet Take 1 tablet (10 mg total) by mouth daily. 90 tablet 3   Clindamycin Phosphate, 1 Dose, (CLINDESSE) vaginal cream Apply 1 applicator topically daily for 7 days. 5.8 g 0   hydrochlorothiazide (HYDRODIURIL) 25 MG tablet Take 1 tablet (25 mg total) by mouth daily. 90 tablet 2   losartan (COZAAR) 50 MG tablet Take 1 tablet (50 mg total) by mouth at bedtime. 30 tablet 0   No current facility-administered medications for this visit.    Allergies: is allergic to flagyl [metronidazole].  Physical Exam:  BP (!) 138/94    Pulse 72    Wt 198 lb 3.2 oz (89.9 kg)    LMP  01/27/2022    BMI 31.04 kg/m  Body mass index is 31.04 kg/m. General appearance: Well nourished, well developed female in no acute distress.  Abdomen: diffusely non tender to palpation, non distended, and no masses, hernias Neuro/Psych:  Normal mood and affect.    Pelvic exam: in the mons area, near the right inguinal fold near the crease at the suprapubic area is a 1cm area that is a healing lesion that appears c/w a folliculitis/hidradenitis, nttp, not bleeding, non weeping.  EGBUS normal Vagina normal with scant amount of old blood in the vault Cervix normal, no active bleeding Bimanual normal   Assessment: pt stable  Plan:  1. Cervical cancer screening - Cytology - PAP( [place])  2. Hidradenitis Clindamycin 1% sent for patient   RTC: PRN  Durene Romans MD Attending Center for [facility])

## 2022-02-17 LAB — CYTOLOGY - PAP
Comment: NEGATIVE
Diagnosis: UNDETERMINED — AB
High risk HPV: NEGATIVE

## 2022-02-18 ENCOUNTER — Other Ambulatory Visit: Payer: Self-pay | Admitting: Family Medicine

## 2022-02-22 ENCOUNTER — Telehealth: Payer: Self-pay

## 2022-02-22 NOTE — Telephone Encounter (Signed)
Returned call, left vm.

## 2022-02-24 ENCOUNTER — Other Ambulatory Visit: Payer: Self-pay | Admitting: Family Medicine

## 2022-02-24 ENCOUNTER — Telehealth: Payer: Self-pay

## 2022-02-24 ENCOUNTER — Encounter: Payer: Self-pay | Admitting: Obstetrics and Gynecology

## 2022-02-24 MED ORDER — CLINDAMYCIN PHOSPHATE 2 % VA GEL: 1.0000 | Freq: Every evening | VAGINAL | 0 refills | Status: DC

## 2022-02-24 MED ORDER — CLINDAMYCIN PHOSPHATE 1 % EX GEL
CUTANEOUS | 1 refills | Status: DC
Start: 2022-02-24 — End: 2022-06-21

## 2022-02-24 NOTE — Telephone Encounter (Signed)
New Rx sent for clindamycin gel x7 days for BV ?

## 2022-02-24 NOTE — Telephone Encounter (Signed)
Returned call and pt stated that she was calling to see if provider would be sending rx from last visit. Advised I would route to provider. ?

## 2022-02-24 NOTE — Telephone Encounter (Signed)
Patient calls nurse line with questions regarding clindamycin rx. Patient states that prescription directions states to use for seven days, however, she was dispensed a single dose application from pharmacy.  ? ?Called pharmacy to discuss. Clindamycin cream sent on 2/22 was for 5.8 grams, which is only a single dose. Pharmacist reports that if patient needs seven day treatment, rx will need to be updated to Clindamycin 2%, dispense quantity of 40 grams.  ? ?Will forward to PCP for further advisement.  ? ?Talbot Grumbling, RN ? ?

## 2022-02-26 ENCOUNTER — Encounter: Payer: Self-pay | Admitting: Obstetrics and Gynecology

## 2022-02-26 DIAGNOSIS — N879 Dysplasia of cervix uteri, unspecified: Secondary | ICD-10-CM | POA: Insufficient documentation

## 2022-03-01 ENCOUNTER — Other Ambulatory Visit: Payer: Self-pay | Admitting: *Deleted

## 2022-03-01 ENCOUNTER — Encounter: Payer: Self-pay | Admitting: *Deleted

## 2022-03-01 DIAGNOSIS — N760 Acute vaginitis: Secondary | ICD-10-CM

## 2022-03-01 DIAGNOSIS — B9689 Other specified bacterial agents as the cause of diseases classified elsewhere: Secondary | ICD-10-CM

## 2022-03-01 MED ORDER — METRONIDAZOLE 0.75 % VA GEL: 1.0000 | Freq: Two times a day (BID) | VAGINAL | 0 refills | Status: AC

## 2022-03-01 NOTE — Progress Notes (Signed)
TC from pt requesting RX change from cleocin to metrogel. Reports VB after using cleocin. Reports having been able to use metrogel without rxn or problem in the past despite listed Flagyl allergy. State the rxn only happens with oral medication. RX sent. ?

## 2022-03-04 ENCOUNTER — Other Ambulatory Visit: Payer: Self-pay | Admitting: *Deleted

## 2022-03-04 MED ORDER — FLUCONAZOLE 150 MG PO TABS: 150.0000 mg | ORAL_TABLET | Freq: Once | ORAL | 0 refills | Status: AC

## 2022-03-04 NOTE — Progress Notes (Signed)
Pt states she feels she may have yeast infection with recent antibiotic use.  ?Diflucan sent today.  ?

## 2022-03-15 ENCOUNTER — Other Ambulatory Visit: Payer: Self-pay | Admitting: Family Medicine

## 2022-05-23 ENCOUNTER — Other Ambulatory Visit: Payer: Self-pay

## 2022-05-23 ENCOUNTER — Emergency Department (HOSPITAL_COMMUNITY)
Admission: EM | Admit: 2022-05-23 | Discharge: 2022-05-23 | Disposition: A | Discharge status: Home / Self Care | Payer: Medicaid Other | Attending: Emergency Medicine | Admitting: Emergency Medicine

## 2022-05-23 ENCOUNTER — Encounter (HOSPITAL_COMMUNITY): Payer: Self-pay | Admitting: Emergency Medicine

## 2022-05-23 DIAGNOSIS — M79602 Pain in left arm: Secondary | ICD-10-CM | POA: Diagnosis not present

## 2022-05-23 DIAGNOSIS — M79622 Pain in left upper arm: Secondary | ICD-10-CM | POA: Insufficient documentation

## 2022-05-23 DIAGNOSIS — M79601 Pain in right arm: Secondary | ICD-10-CM

## 2022-05-23 DIAGNOSIS — M79621 Pain in right upper arm: Secondary | ICD-10-CM | POA: Diagnosis not present

## 2022-05-23 LAB — CBC
HCT: 36.3 % (ref 36.0–46.0)
Hemoglobin: 12.3 g/dL (ref 12.0–15.0)
MCH: 29.1 pg (ref 26.0–34.0)
MCHC: 33.9 g/dL (ref 30.0–36.0)
MCV: 85.8 fL (ref 80.0–100.0)
Platelets: 275 10*3/uL (ref 150–400)
RBC: 4.23 MIL/uL (ref 3.87–5.11)
RDW: 12.8 % (ref 11.5–15.5)
WBC: 7.4 10*3/uL (ref 4.0–10.5)
nRBC: 0 % (ref 0.0–0.2)

## 2022-05-23 LAB — BASIC METABOLIC PANEL
Anion gap: 9 (ref 5–15)
BUN: 8 mg/dL (ref 6–20)
CO2: 25 mmol/L (ref 22–32)
Calcium: 9 mg/dL (ref 8.9–10.3)
Chloride: 104 mmol/L (ref 98–111)
Creatinine, Ser: 0.64 mg/dL (ref 0.44–1.00)
GFR, Estimated: >60 mL/min (ref >60)
Glucose, Bld: 129 mg/dL — ABNORMAL HIGH (ref 70–99)
Potassium: 3.3 mmol/L — ABNORMAL LOW (ref 3.5–5.1)
Sodium: 138 mmol/L (ref 135–145)

## 2022-05-23 LAB — CK: Total CK: 110 U/L (ref 38–234)

## 2022-05-23 LAB — I-STAT BETA HCG BLOOD, ED (MC, WL, AP ONLY): I-stat hCG, quantitative: <5 m[IU]/mL (ref <5)

## 2022-05-23 MED ORDER — NAPROXEN 500 MG PO TABS: 500.0000 mg | ORAL_TABLET | Freq: Two times a day (BID) | ORAL | 0 refills | Status: DC

## 2022-05-23 MED ORDER — KETOROLAC TROMETHAMINE 15 MG/ML IJ SOLN
15.0000 mg | Freq: Once | INTRAMUSCULAR | Status: AC
  Administered 2022-05-23: 15 mg via INTRAMUSCULAR
  Filled 2022-05-23: qty 1

## 2022-05-23 NOTE — ED Triage Notes (Addendum)
Woke from sleep at 4am with bilateral arm "aching" (L>R). Reports similar sensation when she sleeps on her arm but is concerned as both arms are affected, describes as muscular.  Denies vision change, leg pain or tingling, voice changes. Denies fall.  Has not tried any OTC medications for this, took tylenol earlier tonight for a HA which is no longer present. H/o htn (amlodipine, losartan, hctz)

## 2022-05-23 NOTE — Discharge Instructions (Signed)
Your labs and EKG look normal today.

## 2022-05-23 NOTE — ED Provider Triage Note (Signed)
Emergency Medicine Provider Triage Evaluation Note  Amy Snyder , a 31 y.o. female  was evaluated in triage.  Pt complains of bl arm pain. Patient woke up and both arms hurt. L>R. No weakness. No recent injuries  Review of Systems  Positive: Pain  Negative: weakness  Physical Exam  BP (!) 161/107 (BP Location: Right Arm)   Pulse 86   Temp 98.4 F (36.9 C) (Oral)   Resp 17   Wt 84.8 kg   LMP 05/01/2022 (Exact Date)   SpO2 100%   BMI 29.29 kg/m  Gen:   Awake, no distress   Resp:  Normal effort  MSK:   Moves extremities without difficulty  Other:  FROM. No swelling  Medical Decision Making  Medically screening exam initiated at 4:51 AM.  Appropriate orders placed.  Amy Snyder was informed that the remainder of the evaluation will be completed by another provider, this initial triage assessment does not replace that evaluation, and the importance of remaining in the ED until their evaluation is complete.  \work up initiated   Margarita Mail, [place] 05/23/22 [phone]

## 2022-05-23 NOTE — ED Provider Notes (Signed)
[facility] EMERGENCY DEPARTMENT Provider Note   CSN: [id number] Arrival date & time: 05/23/22  0441     History  Chief Complaint  Patient presents with   Bilateral Arm Pain    Amy Snyder is a 31 y.o. female.  Patient presents to the emergency department for bilateral upper extremity soreness, left worse than the right, first noticed when it woke from the patient from sleep at around 3:45 AM.  She denies any associated neck pain, numbness or tingling in the arms.  She has not had any weakness.  She denies all lower extremity symptoms including weakness or pain. Patient denies signs of stroke including: facial droop, slurred speech, aphasia, weakness/numbness in extremities, imbalance/trouble walking.  Patient states that she works at the [employer] using a computer and has not done any strenuous activities with her upper body.  Pain is not really worse with movement and she has full range of motion.  No treatments prior to arrival.  Patient states that she was concerned about her heart and potentially her blood pressure being high.  She is on medication for blood pressure.      Home Medications Prior to Admission medications   Medication Sig Start Date End Date Taking? Authorizing Provider  amLODipine (NORVASC) 10 MG tablet Take 1 tablet (10 mg total) by mouth daily. 01/25/22   [name], DO  clindamycin (CLINDAGEL) 1 % gel Apply to affected area bid until affected is healed. You can apply qday to prevent flare 02/24/22   Aletha Halim, MD  hydrochlorothiazide (HYDRODIURIL) 25 MG tablet Take 1 tablet (25 mg total) by mouth daily. 01/13/22   [name], DO  losartan (COZAAR) 50 MG tablet TAKE 1 TABLET BY MOUTH EVERYDAY AT BEDTIME 03/15/22   [name], DO  XACIATO 2 % GEL PLACE 1 APPLICATOR VAGINALLY AT BEDTIME FOR 7 DAYS. 02/25/22   [name], DO      Allergies    Flagyl [metronidazole]    Review of Systems   Review of  Systems  Physical Exam Updated Vital Signs BP (!) 161/107 (BP Location: Right Arm)   Pulse 86   Temp 98.4 F (36.9 C) (Oral)   Resp 17   Wt 84.8 kg   LMP 05/01/2022 (Exact Date)   SpO2 100%   BMI 29.29 kg/m   Physical Exam Vitals and nursing note reviewed.  Constitutional:      General: She is not in acute distress.    Appearance: She is well-developed.  HENT:     Head: Normocephalic and atraumatic.     Right Ear: External ear normal.     Left Ear: External ear normal.     Nose: Nose normal.  Eyes:     Conjunctiva/sclera: Conjunctivae normal.  Cardiovascular:     Rate and Rhythm: Normal rate and regular rhythm.     Heart sounds: No murmur heard. Pulmonary:     Effort: No respiratory distress.     Breath sounds: No wheezing, rhonchi or rales.  Abdominal:     Palpations: Abdomen is soft.     Tenderness: There is no abdominal tenderness. There is no guarding or rebound.  Musculoskeletal:     Cervical back: Normal range of motion and neck supple.     Right lower leg: No edema.     Left lower leg: No edema.     Comments: Patient with full range of motion in the upper extremities bilaterally.  5 out of 5 strength in the fingers, hands,  wrists, elbows, and shoulders bilaterally.  There is no soft tissue edema or focal tenderness.  No signs of cellulitis or abscess.  2+ radial pulses bilaterally.  Over the cervical spine area, there is no midline or paraspinous tenderness.  Skin:    General: Skin is warm and dry.     Findings: No rash.  Neurological:     General: No focal deficit present.     Mental Status: She is alert. Mental status is at baseline.     Motor: No weakness, tremor or atrophy.     Comments: Patient is able to stand from a lying position and walk without any difficulty or imbalance.  Psychiatric:        Mood and Affect: Mood normal.    ED Results / Procedures / Treatments   Labs (all labs ordered are listed, but only abnormal results are  displayed) Labs Reviewed  BASIC METABOLIC PANEL - Abnormal; Notable for the following components:      Result Value   Potassium 3.3 (*)    Glucose, Bld 129 (*)    All other components within normal limits  CBC  CK  I-STAT BETA HCG BLOOD, ED ([facility], [facility], [facility] ONLY)   ED ECG REPORT   Date: 05/23/2022  Rate: 67  Rhythm: normal sinus rhythm  QRS Axis: normal  Intervals: normal  ST/T Wave abnormalities: normal  Conduction Disutrbances:none  Narrative Interpretation:   Old EKG Reviewed: changes noted, improvement in previous TWI 04/2020  I have personally reviewed the EKG tracing and agree with the computerized printout as noted.  Radiology No results found.  Procedures Procedures    Medications Ordered in ED Medications - No data to display  ED Course/ Medical Decision Making/ A&P    Patient seen and examined. History obtained directly from patient. Work-up including labs, imaging, EKG ordered in triage, if performed, were reviewed.    Labs/EKG: Independently reviewed and interpreted.  This included: CBC unremarkable; BMP with mildly low potassium at 3.3 and glucose of 129; CK normal at 110; pregnancy negative.  Added EKG.  Imaging: None ordered  Medications/Fluids: IM Toradol  Most recent vital signs reviewed and are as follows: BP (!) 161/107 (BP Location: Right Arm)   Pulse 86   Temp 98.4 F (36.9 C) (Oral)   Resp 17   Wt 84.8 kg   LMP 05/01/2022 (Exact Date)   SpO2 100%   BMI 29.29 kg/m   Initial impression: Upper extremity pain, without weakness or any focal neurologic deficits.  Patient's exam is essentially negative other than reported soreness.  She is concerned about her heart and I think given her risk factors it is reasonable to check an EKG and this was ordered.  Although symptoms are atypical for ACS.  No abdominal pain or chest pain.  If EKG reassuring, plan to discharge to home with NSAID treatment.  9:11 AM Reassessment performed. Patient appears  stable.   Labs personally reviewed and interpreted including: EKG normal.   Imaging personally visualized and interpreted including:  Reviewed pertinent lab work and imaging with patient at bedside. Questions answered.   Most current vital signs reviewed and are as follows: BP (!) 161/107 (BP Location: Right Arm)   Pulse 86   Temp 98.4 F (36.9 C) (Oral)   Resp 17   Wt 84.8 kg   LMP 05/01/2022 (Exact Date)   SpO2 100%   BMI 29.29 kg/m   Plan: Discharge to home.   Prescriptions written for: naproxen  Other home  care instructions discussed: RICE protocol  ED return instructions discussed: Return with worsening symptoms, weakness, if she develops  Follow-up instructions discussed: Patient encouraged to follow-up with their PCP in 3 days.                           Medical Decision Making  Patient with bilateral upper extremity pain.  She does not really have any neurologic symptoms including weakness or sensation changes.  Her exam is essentially normal.  No soft tissue swelling.  No neck pain to suggest central cord syndrome and patient does not have any lower extremity symptoms.  No other strokelike symptoms and this would not be typical for a central neuro etiology.  Considered Guillain-Barr, myasthenia gravis, but again symptoms are not would not be typical and there is no weakness.  Labs are reassuring with normal CK without signs of rhabdomyolysis.  EKG is normal and symptoms to be atypical for ACS or PE.  We will treat symptomatically and have patient monitor symptoms closely.  The patient's vital signs, pertinent lab work and imaging were reviewed and interpreted as discussed in the ED course. Hospitalization was considered for further testing, treatments, or serial exams/observation. However as patient is well-appearing, has a stable exam, and reassuring studies today, I do not feel that they warrant admission at this time. This plan was discussed with the patient who  verbalizes agreement and comfort with this plan and seems reliable and able to return to the Emergency Department with worsening or changing symptoms.          Final Clinical Impression(s) / ED Diagnoses Final diagnoses:  Pain in both upper extremities    Rx / DC Orders ED Discharge Orders          Ordered    naproxen (NAPROSYN) 500 MG tablet  2 times daily        05/23/22 0910              Carlisle Cater, PA-C 05/23/22 8144    Carmin Muskrat, MD 05/23/22 1451

## 2022-05-24 ENCOUNTER — Telehealth: Payer: Self-pay

## 2022-05-24 NOTE — Telephone Encounter (Signed)
Transition Care Management Follow-up Telephone Call Date of discharge and from where: 05/23/2022 from [facility] How have you been since you were released from the hospital? Patient stated that she is feeling much better and did not have any questions or concerns at this time.  Any questions or concerns? No  Items Reviewed: Did the pt receive and understand the discharge instructions provided? Yes  Medications obtained and verified? Yes  Other? No  Any new allergies since your discharge? No  Dietary orders reviewed? No Do you have support at home? Yes   Functional Questionnaire: (I = Independent and D = Dependent) ADLs: I  Bathing/Dressing- I  Meal Prep- I  Eating- I  Maintaining continence- I  Transferring/Ambulation- I  Managing Meds- I   Follow up appointments reviewed:  PCP Hospital f/u appt confirmed? No   Are transportation arrangements needed? No  If their condition worsens, is the pt aware to call PCP or go to the Emergency Dept.? Yes Was the patient provided with contact information for the PCP's office or ED? Yes Was to pt encouraged to call back with questions or concerns? Yes

## 2022-05-25 ENCOUNTER — Encounter: Payer: Self-pay | Admitting: *Deleted

## 2022-06-21 ENCOUNTER — Ambulatory Visit (INDEPENDENT_AMBULATORY_CARE_PROVIDER_SITE_OTHER): Payer: Medicaid Other | Admitting: Family Medicine

## 2022-06-21 ENCOUNTER — Encounter: Payer: Self-pay | Admitting: Family Medicine

## 2022-06-21 ENCOUNTER — Other Ambulatory Visit (HOSPITAL_COMMUNITY)
Admission: RE | Admit: 2022-06-21 | Discharge: 2022-06-21 | Disposition: A | Discharge status: Home / Self Care | Payer: Medicaid Other | Source: Ambulatory Visit | Attending: Family Medicine | Admitting: Family Medicine

## 2022-06-21 VITALS — BP 134/80 | HR 67 | Ht 67.0 in | Wt 200.4 lb

## 2022-06-21 DIAGNOSIS — M7989 Other specified soft tissue disorders: Secondary | ICD-10-CM

## 2022-06-21 DIAGNOSIS — N898 Other specified noninflammatory disorders of vagina: Secondary | ICD-10-CM | POA: Diagnosis present

## 2022-06-21 HISTORY — DX: Other specified soft tissue disorders: M79.89

## 2022-06-21 LAB — POCT URINE PREGNANCY: Preg Test, Ur: NEGATIVE

## 2022-06-21 NOTE — Assessment & Plan Note (Signed)
Suspect this is diet related as patient reports regular frequent salt intake  Low suspicion for renal or cardiac related edema  Could also consider amlodipine side given she is on max dose  Patient declines blood work, reviewed recent metabolic panel with no renal abnormalities at this time  Will have patient elevate feet, limit salt in her diet and monitor swelling

## 2022-06-21 NOTE — Progress Notes (Signed)
    SUBJECTIVE:   CHIEF COMPLAINT / HPI: foot problem, vaginal discharge   Foot swelling  Has been present for 1 year  Often has ankle pain when she wakes in the AM  She reports that she has some soreness in her feet  She reports eating take out food frequently and using salt when cooking  She reports arthralgias in her ankles  She denies swelling in other joints  She denies difficulty breathing and denies CP  She has been wearing compression socks on when she sees the foot swelling   Vaginal discharge  Patient reports having thick vaginal discharge for close to 2 months She denies vaginal pain with intercourse  She reports having unprotected intercourse two months and she would like to be checked for gonorrhea and chlamydia  She declines testing for HIV and RPR  She reports that her LMP was three weeks ago   PERTINENT  PMH / PSH:  Hypertension Bacterial vaginitis Endometriosis   OBJECTIVE:   BP 134/80   Pulse 67   Ht '5\' 7"'$  (1.702 m)   Wt 200 lb 6.4 oz (90.9 kg)   LMP 05/01/2022 (Exact Date)   SpO2 99%   BMI 31.39 kg/m   Genitalia:  Normal introitus for age, no external lesions, white copious vaginal discharge with mild odor, mucosa pink and moist, no vaginal or cervical lesions, no vaginal atrophy, no friaility or hemorrhage, normal uterus size and position Physical Exam Musculoskeletal:     Right lower leg: No deformity, tenderness or bony tenderness. Edema present.     Left lower leg: No deformity, tenderness or bony tenderness. Edema present.     Right ankle: Swelling present. No deformity. No tenderness. Normal range of motion. Anterior drawer test negative.     Left ankle: Swelling present. No deformity. No tenderness. Normal range of motion. Anterior drawer test negative.     Right foot: Normal range of motion. Swelling present. No deformity, tenderness or crepitus. Normal pulse.     Left foot: Normal range of motion. Swelling present. No deformity, tenderness or  crepitus. Normal pulse.      ASSESSMENT/PLAN:   Vaginal discharge Suspect recurrent bacterial vaginitis vs chlamydia infection based on appearance of vaginal discharge  Will order ancillary swab to test for chalmydia, gonorrhea, yeast, BV or trichomonas  Will follow up with results once available  Discussed with patient who was in agreement with this plan  Patient declined HIV and RPR today  Patient requested UPT due to unprotected intercourse   Foot swelling Suspect this is diet related as patient reports regular frequent salt intake  Low suspicion for renal or cardiac related edema  Could also consider amlodipine side given she is on max dose  Patient declines blood work, reviewed recent metabolic panel with no renal abnormalities at this time  Will have patient elevate feet, limit salt in her diet and monitor swelling      Eulis Foster, MD [place]

## 2022-06-21 NOTE — Patient Instructions (Signed)
Today we will collect vaginal swabs to test for gonorrhea, chlamydia, bacterial vaginosis or trichomonas.  I will notify you of any abnormal results.  We will also complete x-rays today to check for any signs of arthritis of your ankles.   Please report to [facility] located at: [address]  [place], [place] [address]  You do not need an appointment to have xrays completed.   I will follow up with abnormal results once available.

## 2022-06-21 NOTE — Assessment & Plan Note (Addendum)
Suspect recurrent bacterial vaginitis vs chlamydia infection based on appearance of vaginal discharge  Will order ancillary swab to test for chalmydia, gonorrhea, yeast, BV or trichomonas  Will follow up with results once available  Discussed with patient who was in agreement with this plan  Patient declined HIV and RPR today  Patient requested UPT due to unprotected intercourse

## 2022-06-24 LAB — CERVICOVAGINAL ANCILLARY ONLY
Bacterial Vaginitis (gardnerella): POSITIVE — AB
Candida Glabrata: NEGATIVE
Candida Vaginitis: NEGATIVE
Chlamydia: NEGATIVE
Comment: NEGATIVE
Comment: NEGATIVE
Comment: NEGATIVE
Comment: NEGATIVE
Comment: NEGATIVE
Comment: NORMAL
Neisseria Gonorrhea: NEGATIVE
Trichomonas: NEGATIVE

## 2022-06-24 MED ORDER — CLINDAMYCIN HCL 300 MG PO CAPS: 300.0000 mg | ORAL_CAPSULE | Freq: Two times a day (BID) | ORAL | 0 refills | Status: AC

## 2022-06-24 NOTE — Addendum Note (Signed)
Addended by: Adolph Pollack on: 06/24/2022 01:33 PM   Modules accepted: Orders

## 2022-07-06 ENCOUNTER — Other Ambulatory Visit: Payer: Self-pay | Admitting: Family Medicine

## 2022-07-20 ENCOUNTER — Ambulatory Visit (INDEPENDENT_AMBULATORY_CARE_PROVIDER_SITE_OTHER): Payer: Medicaid Other | Admitting: Family Medicine

## 2022-07-20 VITALS — BP 126/77 | HR 82 | Ht 67.0 in | Wt 200.2 lb

## 2022-07-20 DIAGNOSIS — Z01818 Encounter for other preprocedural examination: Secondary | ICD-10-CM

## 2022-07-20 HISTORY — DX: Encounter for other preprocedural examination: Z01.818

## 2022-07-20 NOTE — Progress Notes (Signed)
    SUBJECTIVE:   CHIEF COMPLAINT / HPI:   Patient with history of hypertension presents for surgical clearance, she desires to get a [place] butt lift scheduled on 8/24. Denies chest pain, dyspnea and other symptoms. She walks about 15 minutes a day for exercise. Attempted to get this procedure done earlier but at the time she was not able to get it done.    OBJECTIVE:   BP 126/77   Pulse 82   Ht '5\' 7"'$  (1.702 m)   Wt 200 lb 4 oz (90.8 kg)   LMP 07/20/2022   SpO2 98%   BMI 31.36 kg/m    General: Patient well-appearing, in no acute distress. HEENT: PERRLA, non-tender thyroid, no evidence of cervical LAD CV: RRR, no murmurs or gallops Resp: CTAB, no wheezing, rales or rhonchi Abdomen: soft, nontender, nondistended, presence of bowel sounds Ext: no LE edema noted bilaterally  Psych: mood appropriate   ASSESSMENT/PLAN:   Preoperative clearance -patient presents for medical clearance, she has a history of hypertension which is well-controlled with her current regimen. Labs pending, will notify patient of results. Explained to patient that EKG is not indicated as she is overall healthy and insurance may not cover it. If it is needed for surgery, I explained to patient that we can get this done at another visit.  -instructed patient to bring in surgical clearance forms at earliest convenience, will complete these when available and calculate revised cardiac risk index when labs become available  -patient is low risk for upcoming procedure   -PHQ-9 score of 0 reviewed.   Donney Dice, [place]

## 2022-07-20 NOTE — Assessment & Plan Note (Signed)
-  patient presents for medical clearance, she has a history of hypertension which is well-controlled with her current regimen. Labs pending, will notify patient of results. Explained to patient that EKG is not indicated as she is overall healthy and insurance may not cover it. If it is needed for surgery, I explained to patient that we can get this done at another visit.  -instructed patient to bring in surgical clearance forms at earliest convenience, will complete these when available and calculate revised cardiac risk index when labs become available  -patient is low risk for upcoming procedure

## 2022-07-20 NOTE — Patient Instructions (Signed)
It was great seeing you today!  Today we discussed your surgical clearance, please bring the form at your earliest convenience. When your labs return, I will let you know of any abnormal results or send a letter with normal results. If you still need an EKG, we can schedule that before your surgery.   Please continue to take your blood pressure medications, your blood pressure was great today at 126/77.   Please follow up at your next scheduled appointment, if anything arises between now and then, please don't hesitate to contact our office.   Thank you for allowing [place] to be a part of your medical care!  Thank you, Dr. Larae Grooms

## 2022-07-21 LAB — T4, FREE: Free T4: 1.42 ng/dL (ref 0.82–1.77)

## 2022-07-21 LAB — ACUTE VIRAL HEPATITIS (HAV, HBV, HCV)
HCV Ab: NONREACTIVE
Hep A IgM: NEGATIVE
Hep B C IgM: NEGATIVE
Hepatitis B Surface Ag: NEGATIVE

## 2022-07-21 LAB — COMPREHENSIVE METABOLIC PANEL
ALT: 24 IU/L (ref 0–32)
AST: 29 IU/L (ref 0–40)
Albumin/Globulin Ratio: 1.7 (ref 1.2–2.2)
Albumin: 4.7 g/dL (ref 3.9–4.9)
Alkaline Phosphatase: 55 IU/L (ref 44–121)
BUN/Creatinine Ratio: 14 (ref 9–23)
BUN: 9 mg/dL (ref 6–20)
Bilirubin Total: 0.3 mg/dL (ref 0.0–1.2)
CO2: 24 mmol/L (ref 20–29)
Calcium: 9.3 mg/dL (ref 8.7–10.2)
Chloride: 96 mmol/L (ref 96–106)
Creatinine, Ser: 0.64 mg/dL (ref 0.57–1.00)
Globulin, Total: 2.8 g/dL (ref 1.5–4.5)
Glucose: 116 mg/dL — ABNORMAL HIGH (ref 70–99)
Potassium: 3.3 mmol/L — ABNORMAL LOW (ref 3.5–5.2)
Sodium: 137 mmol/L (ref 134–144)
Total Protein: 7.5 g/dL (ref 6.0–8.5)
eGFR: 121 mL/min/{1.73_m2} (ref >59)

## 2022-07-21 LAB — URINALYSIS, MICROSCOPIC ONLY
Casts: NONE SEEN /lpf
Epithelial Cells (non renal): >10 /hpf — AB (ref 0–10)

## 2022-07-21 LAB — BETA HCG QUANT (REF LAB): hCG Quant: <1 m[IU]/mL

## 2022-07-21 LAB — HEMOGLOBIN A1C
Est. average glucose Bld gHb Est-mCnc: 131 mg/dL
Hgb A1c MFr Bld: 6.2 % — ABNORMAL HIGH (ref 4.8–5.6)

## 2022-07-21 LAB — PROTIME-INR
INR: 1 (ref 0.9–1.2)
Prothrombin Time: 11 s (ref 9.1–12.0)

## 2022-07-21 LAB — HCV INTERPRETATION

## 2022-07-21 LAB — TSH: TSH: 1.09 u[IU]/mL (ref 0.450–4.500)

## 2022-07-21 LAB — HEPATITIS C ANTIBODY: Hep C Virus Ab: NONREACTIVE

## 2022-07-21 LAB — HIV ANTIBODY (ROUTINE TESTING W REFLEX): HIV Screen 4th Generation wRfx: NONREACTIVE

## 2022-07-22 ENCOUNTER — Other Ambulatory Visit: Payer: Self-pay | Admitting: Family Medicine

## 2022-07-22 DIAGNOSIS — E876 Hypokalemia: Secondary | ICD-10-CM

## 2022-07-22 MED ORDER — POTASSIUM CHLORIDE CRYS ER 20 MEQ PO TBCR: 40.0000 meq | EXTENDED_RELEASE_TABLET | Freq: Once | ORAL | 0 refills | Status: DC

## 2022-07-23 ENCOUNTER — Telehealth: Payer: Self-pay | Admitting: Family Medicine

## 2022-07-23 NOTE — Progress Notes (Signed)
Contacted the pt to schedule appt. Pt scheduled with me on 08/18 with Dr.Ganta at 3:10 pm

## 2022-07-23 NOTE — Telephone Encounter (Signed)
Reviewed form and placed in PCP's box for completion.  .Alexiz Cothran R Omari Koslosky, CMA  

## 2022-07-23 NOTE — Telephone Encounter (Signed)
Patient dropped off medical clearance form to be completed. Last DOS was 07/20/22. Placed in [employer].

## 2022-07-27 ENCOUNTER — Telehealth: Payer: Self-pay | Admitting: Family Medicine

## 2022-07-27 ENCOUNTER — Other Ambulatory Visit: Payer: Self-pay | Admitting: Family Medicine

## 2022-07-27 DIAGNOSIS — E876 Hypokalemia: Secondary | ICD-10-CM

## 2022-07-27 NOTE — Telephone Encounter (Signed)
Patient called and would like to have her labs redone that were abnormal. She is not able to wait until follow up on 08/06/22 due to needing it earlier for surgical clearance.   She would like future orders to be placed and to come in for a lab appointment only. Please call patient when future orders have been placed.   The best call back is [phone].

## 2022-07-27 NOTE — Telephone Encounter (Signed)
Order placed for BMP to recheck potassium and lab scheduled for 8/10. Have also scheduled patient for RN visit on 8/10 for EKG as patient states she needs this for surgical clearance. We discussed that this would not typically be indicated given no hx cardiac conditions and asymptomatic but will proceed.   FYI to RN's. I will not be in the office that day but please place in my box for review.

## 2022-07-29 ENCOUNTER — Other Ambulatory Visit: Payer: Self-pay

## 2022-07-29 ENCOUNTER — Ambulatory Visit (HOSPITAL_COMMUNITY): Payer: Medicaid Other | Attending: Family Medicine

## 2022-07-29 ENCOUNTER — Ambulatory Visit (INDEPENDENT_AMBULATORY_CARE_PROVIDER_SITE_OTHER): Payer: Medicaid Other

## 2022-07-29 ENCOUNTER — Other Ambulatory Visit: Payer: Medicaid Other

## 2022-07-29 DIAGNOSIS — Z01810 Encounter for preprocedural cardiovascular examination: Secondary | ICD-10-CM | POA: Insufficient documentation

## 2022-07-29 DIAGNOSIS — E876 Hypokalemia: Secondary | ICD-10-CM | POA: Diagnosis not present

## 2022-07-29 DIAGNOSIS — Z01818 Encounter for other preprocedural examination: Secondary | ICD-10-CM

## 2022-07-29 NOTE — Progress Notes (Signed)
Patient presents to nurse clinic for EKG for pre-op clearance.   EKG obtained. See under ECG in chart review.   Patient is asymptomatic, only receiving due to requirement from surgeon's office. Precepted with Dr. Erin Hearing prior to patient leaving. As patient is asymptomatic, will have PCP review and go over next steps.   EKG placed in PCP box for review.   Patient signed release of medical information to fax medical clearance and EKG to [facility]. (Fax number: [phone])   Will fax after provider review of EKG and lab results have returned.   Talbot Grumbling, RN

## 2022-07-29 NOTE — Telephone Encounter (Signed)
EKG reviewed and sinus without acute abnormalities.

## 2022-07-30 ENCOUNTER — Other Ambulatory Visit: Payer: Self-pay | Admitting: Family Medicine

## 2022-07-30 DIAGNOSIS — Z01818 Encounter for other preprocedural examination: Secondary | ICD-10-CM

## 2022-07-30 LAB — BASIC METABOLIC PANEL
BUN/Creatinine Ratio: 10 (ref 9–23)
BUN: 7 mg/dL (ref 6–20)
CO2: 25 mmol/L (ref 20–29)
Calcium: 9.4 mg/dL (ref 8.7–10.2)
Chloride: 98 mmol/L (ref 96–106)
Creatinine, Ser: 0.67 mg/dL (ref 0.57–1.00)
Glucose: 96 mg/dL (ref 70–99)
Potassium: 3.6 mmol/L (ref 3.5–5.2)
Sodium: 139 mmol/L (ref 134–144)
eGFR: 120 mL/min/{1.73_m2} (ref >59)

## 2022-07-30 NOTE — Telephone Encounter (Signed)
Future order placed for CBC.  Attempted to call patient to schedule lab appointment but no answer. Left voicemail and requested that patient call the clinic to schedule.

## 2022-07-30 NOTE — Telephone Encounter (Signed)
Patient calls nurse line due to missing labs for surgical clearance.   Patient is needing a CBC.   If appropriate, please place order and we can schedule patient for a lab visit next week.   Patient requested that I wait to fax paperwork until after we get these results back. Paperwork will remain on my desk.   Talbot Grumbling, RN

## 2022-08-03 ENCOUNTER — Other Ambulatory Visit: Payer: Medicaid Other

## 2022-08-03 DIAGNOSIS — Z01818 Encounter for other preprocedural examination: Secondary | ICD-10-CM

## 2022-08-04 LAB — CBC
Hematocrit: 36.2 % (ref 34.0–46.6)
Hemoglobin: 11.9 g/dL (ref 11.1–15.9)
MCH: 27.5 pg (ref 26.6–33.0)
MCHC: 32.9 g/dL (ref 31.5–35.7)
MCV: 84 fL (ref 79–97)
Platelets: 337 10*3/uL (ref 150–450)
RBC: 4.32 x10E6/uL (ref 3.77–5.28)
RDW: 12.6 % (ref 11.7–15.4)
WBC: 8.7 10*3/uL (ref 3.4–10.8)

## 2022-08-05 ENCOUNTER — Telehealth: Payer: Self-pay

## 2022-08-05 NOTE — Telephone Encounter (Signed)
Delay in documentation. Faxed paperwork to surgeon's office on Tuesday, 8/15.   Copy made and placed in batch scanning.   Talbot Grumbling, RN

## 2022-08-05 NOTE — Telephone Encounter (Signed)
Patient calls nurse line reporting surgeon is requesting additional testing for procedure.   Patient reports needing a APTT and T4.   Will forward to PCP.

## 2022-08-06 ENCOUNTER — Ambulatory Visit: Payer: Medicaid Other | Admitting: Family Medicine

## 2022-08-06 NOTE — Telephone Encounter (Signed)
Labs faxed to [facility].   Patient has been updated.

## 2022-08-11 ENCOUNTER — Telehealth: Payer: Self-pay | Admitting: Family Medicine

## 2022-08-11 NOTE — Telephone Encounter (Signed)
Discussed with patient difficulty in completing forms at this time as there is not necessarily a medical condition that is limiting her work. She is scheduled to undergo an elective BBL surgery tomorrow, 8/24. She was requesting a specialized stool for post-operative recovery. She notes that she will be provided with a pillow post-operatively that should help and at this time does not need the paperwork. She will let me know if the pillow does not suffice and we can revist.

## 2022-08-11 NOTE — Telephone Encounter (Signed)
Placed in MDs box to be filled out. Zaila Crew, CMA  

## 2022-08-11 NOTE — Telephone Encounter (Signed)
Patient dropped off medical restriction form to be completed. Last DOS was 07/20/22. Placed in [employer].

## 2022-08-19 ENCOUNTER — Encounter (HOSPITAL_COMMUNITY): Payer: Self-pay | Admitting: Emergency Medicine

## 2022-08-19 ENCOUNTER — Ambulatory Visit (HOSPITAL_COMMUNITY)
Admission: EM | Admit: 2022-08-19 | Discharge: 2022-08-19 | Disposition: A | Discharge status: Home / Self Care | Payer: Medicaid Other | Attending: Family Medicine | Admitting: Family Medicine

## 2022-08-19 DIAGNOSIS — J101 Influenza due to other identified influenza virus with other respiratory manifestations: Secondary | ICD-10-CM

## 2022-08-19 DIAGNOSIS — Z20822 Contact with and (suspected) exposure to covid-19: Secondary | ICD-10-CM | POA: Insufficient documentation

## 2022-08-19 DIAGNOSIS — R059 Cough, unspecified: Secondary | ICD-10-CM | POA: Diagnosis present

## 2022-08-19 DIAGNOSIS — R0981 Nasal congestion: Secondary | ICD-10-CM | POA: Diagnosis present

## 2022-08-19 DIAGNOSIS — R509 Fever, unspecified: Secondary | ICD-10-CM | POA: Diagnosis present

## 2022-08-19 LAB — POC INFLUENZA A AND B ANTIGEN (URGENT CARE ONLY)
INFLUENZA A ANTIGEN, POC: NEGATIVE
INFLUENZA B ANTIGEN, POC: POSITIVE — AB

## 2022-08-19 MED ORDER — PROMETHAZINE-DM 6.25-15 MG/5ML PO SYRP: 5.0000 mL | ORAL_SOLUTION | Freq: Four times a day (QID) | ORAL | 0 refills | Status: DC | PRN

## 2022-08-19 MED ORDER — OSELTAMIVIR PHOSPHATE 75 MG PO CAPS: 75.0000 mg | ORAL_CAPSULE | Freq: Two times a day (BID) | ORAL | 0 refills | Status: AC

## 2022-08-19 NOTE — ED Triage Notes (Signed)
Pt had BBL in [place] week ago today.  Reports that having congestion, chills, sweats, cough that is dry. Also c/o frontal headache. Had fever of 102, took tylenol around 430p. Also taken Mucinex and Claritin today as well.

## 2022-08-19 NOTE — ED Provider Notes (Signed)
[place]   [id number] 08/19/22 Arrival Time: 6269  ASSESSMENT & PLAN:  1. Influenza B    Work note provided. Discussed typical duration of viral illnesses.  Labs Reviewed  POC INFLUENZA A AND B ANTIGEN (URGENT CARE ONLY) - Abnormal; Notable for the following components:      Result Value   INFLUENZA B ANTIGEN, POC POSITIVE (*)    All other components within normal limits  SARS CORONAVIRUS 2 (TAT 6-24 HRS)   COVID testing pending. OTC symptom care as needed.  Discharge Medication List as of 08/19/2022  6:20 PM     START taking these medications   Details  oseltamivir (TAMIFLU) 75 MG capsule Take 1 capsule (75 mg total) by mouth 2 (two) times daily for 5 days., Starting Thu 08/19/2022, Until Tue 08/24/2022, Normal    promethazine-dextromethorphan (PROMETHAZINE-DM) 6.25-15 MG/5ML syrup Take 5 mLs by mouth 4 (four) times daily as needed for cough., Starting Thu 08/19/2022, Normal         Follow-up Information     [name], DO.   Specialty: Family Medicine Why: As needed. Contact information: [place] [address] [phone]                 Reviewed expectations re: course of current medical issues. Questions answered. Outlined signs and symptoms indicating need for more acute intervention. Understanding verbalized. After Visit Summary given.   SUBJECTIVE: History from: Patient. Amy Snyder is a 31 y.o. female. Reports: nasal cong, subj fever/chills, cough; abrupt onset x 2 days. Tmax 102F. Denies: difficulty breathing. Normal PO intake without n/v/d.  OBJECTIVE:  Vitals:   08/19/22 1739  BP: 133/73  Pulse: 99  Resp: 17  Temp: 100.3 F (37.9 C)  TempSrc: Oral  SpO2: 100%    General appearance: alert; no distress Eyes: PERRLA; EOMI; conjunctiva normal HENT: [place]; AT; with nasal congestion Neck: supple  Lungs: speaks full sentences without difficulty; unlabored Extremities: no edema Skin: warm and  dry Neurologic: normal gait Psychological: alert and cooperative; normal mood and affect  Labs: Results for orders placed or performed during the hospital encounter of 08/19/22  POC Influenza A & B Ag (Urgent Care)  Result Value Ref Range   INFLUENZA A ANTIGEN, POC NEGATIVE NEGATIVE   INFLUENZA B ANTIGEN, POC POSITIVE (A) NEGATIVE   Labs Reviewed  POC INFLUENZA A AND B ANTIGEN (URGENT CARE ONLY) - Abnormal; Notable for the following components:      Result Value   INFLUENZA B ANTIGEN, POC POSITIVE (*)    All other components within normal limits  SARS CORONAVIRUS 2 (TAT 6-24 HRS)     Allergies  Allergen Reactions   Flagyl [Metronidazole] Itching, Swelling and Rash    Pt reports she is able to use Metronidazole gel without problem or reaction    Past Medical History:  Diagnosis Date   BV (bacterial vaginosis)    Endometriosis    per biopsy done 11/2016   History of cesarean section, classical 02/14/2019   Classical c section in 2010 d/t footling breech in active labor   History of preterm delivery 02/14/2019   Hypertension    Social History   Socioeconomic History   Marital status: Single    Spouse name: Not on file   Number of children: 1   Years of education: Not on file   Highest education level: Not on file  Occupational History   Occupation: [employer]  Tobacco Use   Smoking status: Former  Packs/day: 0.20    Years: 3.00    Total pack years: 0.60    Types: Cigarettes    Quit date: 12/21/2019    Years since quitting: 2.6   Smokeless tobacco: Never   Tobacco comments:    smokes socially  Vaping Use   Vaping Use: Never used  Substance and Sexual Activity   Alcohol use: Yes    Alcohol/week: 2.0 standard drinks of alcohol    Types: 2 Shots of liquor per week    Comment: socially   Drug use: No   Sexual activity: Yes    Partners: Male    Birth control/protection: None, Condom    Comment: Sometimes  Other Topics Concern   Not on file  Social  History Narrative   Not on file   Social Determinants of Health   Financial Resource Strain: Not on file  Food Insecurity: Not on file  Transportation Needs: Not on file  Physical Activity: Not on file  Stress: Not on file  Social Connections: Not on file  Intimate Partner Violence: Not on file   Family History  Problem Relation Age of Onset   Hypertension Mother    Diabetes Other    Diabetes Maternal Grandfather    Past Surgical History:  Procedure Laterality Date   CESAREAN SECTION  10/21/2009   Classical c/s      Vanessa Kick, MD 08/19/22 [phone]

## 2022-08-19 NOTE — Discharge Instructions (Signed)

## 2022-08-20 ENCOUNTER — Ambulatory Visit: Payer: Medicaid Other

## 2022-08-20 LAB — SARS CORONAVIRUS 2 (TAT 6-24 HRS): SARS Coronavirus 2: NEGATIVE

## 2022-08-20 NOTE — Progress Notes (Deleted)
    SUBJECTIVE:   CHIEF COMPLAINT / HPI:   Surgery Follow up Pt presents after brazilian butt lift of 8/24.   PERTINENT  PMH / PSH: ***  OBJECTIVE:   LMP 08/12/2022  ***  General: NAD, pleasant, able to participate in exam Cardiac: RRR, no murmurs. Respiratory: CTAB, normal effort, No wheezes, rales or rhonchi Abdomen: Bowel sounds present, nontender, nondistended, no hepatosplenomegaly. Extremities: no edema or cyanosis. Skin: warm and dry, no rashes noted Neuro: alert, no obvious focal deficits Psych: Normal affect and mood  ASSESSMENT/PLAN:   No problem-specific Assessment & Plan notes found for this encounter.     Dr. Precious Gilding, [place]    {    This will disappear when note is signed, click to select method of visit    :1}

## 2022-08-22 ENCOUNTER — Encounter (HOSPITAL_COMMUNITY): Payer: Self-pay | Admitting: *Deleted

## 2022-08-22 ENCOUNTER — Ambulatory Visit (HOSPITAL_COMMUNITY)
Admission: EM | Admit: 2022-08-22 | Discharge: 2022-08-22 | Disposition: A | Discharge status: Home / Self Care | Payer: Medicaid Other | Attending: Physician Assistant | Admitting: Physician Assistant

## 2022-08-22 DIAGNOSIS — N760 Acute vaginitis: Secondary | ICD-10-CM | POA: Diagnosis not present

## 2022-08-22 DIAGNOSIS — Z5189 Encounter for other specified aftercare: Secondary | ICD-10-CM | POA: Diagnosis present

## 2022-08-22 DIAGNOSIS — Z4889 Encounter for other specified surgical aftercare: Secondary | ICD-10-CM

## 2022-08-22 DIAGNOSIS — Z113 Encounter for screening for infections with a predominantly sexual mode of transmission: Secondary | ICD-10-CM | POA: Insufficient documentation

## 2022-08-22 NOTE — ED Triage Notes (Signed)
Pt presents today with concerns about infection located at surgical site on buttocks. Pt also reports Vag discharge. Pt also wants a STD check.

## 2022-08-22 NOTE — Discharge Instructions (Addendum)
Advised to continue checking the wound area where the drain was placed, this will periodically drain for about another week to 10 days before it resolves and the wound completely closes.  If there is any additional problems then make sure to contact the plastic surgeon for advice on any further treatment that may need to be pursued. Lab testing will be completed and 48 hours, if you do not get a call from this office that indicates that the lab results are negative or normal.  You can go on MyChart and check lab results when they post at 24 and 48 hours. Advised to follow-up with PCP or return to urgent care if symptoms fail to improve.

## 2022-08-22 NOTE — ED Provider Notes (Signed)
[name]    CSN: [id number] Arrival date & time: 08/22/22  1005      History   Chief Complaint Chief Complaint  Patient presents with   Post-op Problem   Exposure to STD   Vaginal Discharge    HPI Amy Snyder is a 31 y.o. female.   31 year old female presents for wound check from postop procedure, vaginal discharge and STI screening.  Patient indicates that 2 weeks ago she had a [place] butt lift that was performed in [place] by a Psychiatric nurse.  The surgical office advised for her to remove the drain from the incision site 1 week after procedure which the patient did.  Patient presents today and is concerned about drainage from the wound site that has been occurring over the past week.  She indicates that she is not having any pain at the site, no redness or swelling, no fever or chills. Patient indicates that she also has been having vaginal discharge with odor for the past several days, this includes mild vaginal itching, but she is not sure about the discharge due to the presenting and butt lift support sick that she has to wear for the next week or so.  She indicates that her last period was 2 weeks ago and it was normal.  She denies any frequency, urgency, or dysuria.  She declines to have the RPR and HIV testing today.   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   Normal pelvic exam 02/04/2021   At risk for sexually transmitted disease due to  unprotected sex 02/19/2020   Endometriosis 12/06/2017   Vaginal discharge 57/12/7791   Folliculitis 90/30/0923   Essential hypertension, benign 07/03/2014   BV (bacterial vaginosis) 01/21/2014   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 Authorizing Provider  amLODipine (NORVASC) 10 MG tablet Take 1 tablet (10 mg total) by mouth daily. 01/25/22   [name], DO  hydrochlorothiazide (HYDRODIURIL) 25 MG tablet Take 1 tablet (25 mg total) by mouth daily. 01/13/22   [name], DO  losartan (COZAAR) 50 MG tablet TAKE 1 TABLET BY MOUTH EVERYDAY AT BEDTIME 07/06/22   [name], DO  oseltamivir (TAMIFLU) 75 MG capsule Take 1 capsule (75 mg total) by mouth 2 (two) times daily for 5 days.  08/19/22 08/24/22  Vanessa Kick, MD  potassium chloride SA (KLOR-CON M) 20 MEQ tablet Take 2 tablets (40 mEq total) by mouth once for 1 dose. 07/22/22 07/22/22  Donney Dice, DO  promethazine-dextromethorphan (PROMETHAZINE-DM) 6.25-15 MG/5ML syrup Take 5 mLs by mouth 4 (four) times daily as needed for cough. 08/19/22   Vanessa Kick, MD    Family History Family History  Problem Relation Age of Onset   Hypertension Mother    Diabetes Other    Diabetes Maternal Grandfather     Social History Social History   Tobacco Use   Smoking status: Former    Packs/day: 0.20    Years: 3.00    Total pack years: 0.60    Types: Cigarettes    Quit date: 12/21/2019    Years since quitting: 2.6   Smokeless tobacco: Never   Tobacco comments:    smokes socially  Vaping Use   Vaping Use: Never used  Substance Use Topics    Alcohol use: Yes    Alcohol/week: 2.0 standard drinks of alcohol    Types: 2 Shots of liquor per week    Comment: socially   Drug use: No     Allergies   Flagyl [metronidazole]   Review of Systems Review of Systems  Genitourinary:  Positive for vaginal discharge.     Physical Exam Triage Vital Signs ED Triage Vitals  Enc Vitals Group     BP 08/22/22 1030 115/70     Pulse Rate 08/22/22 1030 94     Resp 08/22/22 1030 18     Temp 08/22/22 1030 98.4 F (36.9 C)     Temp src --      SpO2 08/22/22 1030 98 %     Weight --      Height --      Head Circumference --      Peak Flow --      Pain Score 08/22/22 1029 0     Pain Loc --      Pain Edu? --      Excl. in [place]? --    No data found.  Updated Vital Signs BP 115/70   Pulse 94   Temp 98.4 F (36.9 C)   Resp 18   LMP 08/12/2022   SpO2 98%   Visual Acuity Right Eye Distance:   Left Eye Distance:   Bilateral Distance:    Right Eye Near:   Left Eye Near:    Bilateral Near:     Physical Exam Constitutional:      Appearance: Normal appearance.  Skin:         Comments: Postop wound: There is an incision site where the drain was placed in the left lower quadrant of the abdomen, this area appears to be healing well, no active drainage at the present time but there is been a small amount of drainage on the gauze dressing that covers the area.  There is a mild amount of redness surrounding the wound however this is due to tape reaction from the bandage, there is no pain on palpation of the wound site, and no streaking or swelling.  Neurological:     Mental Status: She is alert.      UC Treatments / Results  Labs (all labs ordered are listed, but only abnormal results are displayed) Labs Reviewed  CERVICOVAGINAL ANCILLARY ONLY    EKG   Radiology No results found.  Procedures Procedures (including critical care time)  Medications Ordered in UC Medications - No  data to display  Initial Impression /  Assessment and Plan / UC Course  I have reviewed the triage vital signs and the nursing notes.  Pertinent labs & imaging results that were available during my care of the patient were reviewed by me and considered in my medical decision making (see chart for details).    Plan: 1.  STI screening is pending. 2.  Advised to continue to observe the wound healing area and contact the plastic surgeon if any problems develop over the next 7 to 10 days. 3.  Advised to follow-up PCP or return to urgent care if symptoms fail to improve. Final Clinical Impressions(s) / UC Diagnoses   Final diagnoses:  Routine screening for STI (sexually transmitted infection)  Vaginitis and vulvovaginitis  Visit for wound check     Discharge Instructions      Advised to continue checking the wound area where the drain was placed, this will periodically drain for about another week to 10 days before it resolves and the wound completely closes.  If there is any additional problems then make sure to contact the plastic surgeon for advice on any further treatment that may need to be pursued. Lab testing will be completed and 48 hours, if you do not get a call from this office that indicates that the lab results are negative or normal.  You can go on MyChart and check lab results when they post at 24 and 48 hours. Advised to follow-up with PCP or return to urgent care if symptoms fail to improve.    ED Prescriptions   None    PDMP not reviewed this encounter.   Nyoka Lint, PA-C 08/22/22 1103

## 2022-08-24 LAB — CERVICOVAGINAL ANCILLARY ONLY
Bacterial Vaginitis (gardnerella): NEGATIVE
Candida Glabrata: NEGATIVE
Candida Vaginitis: POSITIVE — AB
Chlamydia: NEGATIVE
Comment: NEGATIVE
Comment: NEGATIVE
Comment: NEGATIVE
Comment: NEGATIVE
Comment: NEGATIVE
Comment: NORMAL
Neisseria Gonorrhea: NEGATIVE
Trichomonas: NEGATIVE

## 2022-08-25 ENCOUNTER — Telehealth (HOSPITAL_COMMUNITY): Payer: Self-pay | Admitting: Emergency Medicine

## 2022-08-25 MED ORDER — FLUCONAZOLE 150 MG PO TABS: 150.0000 mg | ORAL_TABLET | Freq: Once | ORAL | 0 refills | Status: AC

## 2022-09-21 ENCOUNTER — Other Ambulatory Visit: Payer: Self-pay

## 2022-09-21 DIAGNOSIS — I10 Essential (primary) hypertension: Secondary | ICD-10-CM

## 2022-09-21 MED ORDER — HYDROCHLOROTHIAZIDE 25 MG PO TABS: 25.0000 mg | ORAL_TABLET | Freq: Every day | ORAL | 2 refills | Status: DC

## 2022-09-21 MED ORDER — AMLODIPINE BESYLATE 10 MG PO TABS: 10.0000 mg | ORAL_TABLET | Freq: Every day | ORAL | 3 refills | Status: DC

## 2022-10-08 ENCOUNTER — Ambulatory Visit: Payer: Medicaid Other | Admitting: Family Medicine

## 2022-10-12 ENCOUNTER — Ambulatory Visit (INDEPENDENT_AMBULATORY_CARE_PROVIDER_SITE_OTHER): Payer: Medicaid Other | Admitting: Student

## 2022-10-12 ENCOUNTER — Other Ambulatory Visit (HOSPITAL_COMMUNITY)
Admission: RE | Admit: 2022-10-12 | Discharge: 2022-10-12 | Disposition: A | Discharge status: Home / Self Care | Payer: Medicaid Other | Source: Ambulatory Visit | Attending: Family Medicine | Admitting: Family Medicine

## 2022-10-12 ENCOUNTER — Encounter: Payer: Self-pay | Admitting: Student

## 2022-10-12 VITALS — BP 134/79 | HR 78 | Ht 67.0 in | Wt 199.2 lb

## 2022-10-12 DIAGNOSIS — N898 Other specified noninflammatory disorders of vagina: Secondary | ICD-10-CM | POA: Insufficient documentation

## 2022-10-12 LAB — POCT WET PREP (WET MOUNT)
Clue Cells Wet Prep Whiff POC: NEGATIVE
Trichomonas Wet Prep HPF POC: ABSENT

## 2022-10-12 MED ORDER — CLINDAMYCIN HCL 300 MG PO CAPS: 300.0000 mg | ORAL_CAPSULE | Freq: Two times a day (BID) | ORAL | 0 refills | Status: DC

## 2022-10-12 MED ORDER — METRONIDAZOLE 500 MG PO TABS: 500.0000 mg | ORAL_TABLET | Freq: Three times a day (TID) | ORAL | 0 refills | Status: DC

## 2022-10-12 NOTE — Patient Instructions (Addendum)
It was wonderful to meet you today. Thank you for allowing me to be a part of your care. Below is a short summary of what we discussed at your visit today:  I have started you on clindamycin 300 mg which you will take twice daily for 7 days.  Obtain lab to check for bacterial, fungi and BV infection.  We will follow-up with you with the results.  Please bring all of your medications to every appointment!  If you have any questions or concerns, please do not hesitate to contact us via phone or MyChart message.   Alen Bleacher, MD [facility]

## 2022-10-12 NOTE — Progress Notes (Signed)
    SUBJECTIVE:   CHIEF COMPLAINT / HPI:   31 y.o.  year old female presents with concerns of BV Reports she has been having vaginal discharge for about a week now and this is similar to her previous BV diagnosis.  Denies increased frequency and urgency.  She is sexually active with one partner and inconsistent with protection. Denies vaginal itchiness or odor.  No hematuria or recent change in medication. Currently 3 days into her period    PERTINENT  PMH / PSH: Reviewed  OBJECTIVE:   BP 134/79   Pulse 78   Ht '5\' 7"'$  (1.702 m)   Wt 199 lb 3.2 oz (90.4 kg)   LMP 10/09/2022 (Exact Date)   SpO2 100%   BMI 31.20 kg/m    Physical Exam General: Alert, well appearing, NAD Cardiovascular: Regular rate, well-perfused Respiratory: Normal work of breathing on room air Abdomen: No distension or tenderness Pelvic exam: Normal vulvovaginal wall, no erythema. Mild bleeding from the cervical ox, no notable discharge.   ASSESSMENT/PLAN:  Vaginal discharge Patient with complain of vaginal discharge but no itchiness or odor.Her exam was unremarkable and Wetprep was negative.  -Follow up with lab for GC, Chlamydia, Trichomonas.   Alen Bleacher, MD [place]

## 2022-10-14 LAB — CERVICOVAGINAL ANCILLARY ONLY
Chlamydia: NEGATIVE
Comment: NEGATIVE
Comment: NORMAL
Neisseria Gonorrhea: NEGATIVE

## 2023-01-13 ENCOUNTER — Ambulatory Visit (HOSPITAL_COMMUNITY)
Admission: EM | Admit: 2023-01-13 | Discharge: 2023-01-13 | Disposition: A | Discharge status: Home / Self Care | Payer: Medicaid Other | Attending: Internal Medicine | Admitting: Internal Medicine

## 2023-01-13 ENCOUNTER — Encounter (HOSPITAL_COMMUNITY): Payer: Self-pay | Admitting: Emergency Medicine

## 2023-01-13 ENCOUNTER — Other Ambulatory Visit: Payer: Self-pay

## 2023-01-13 DIAGNOSIS — Z1152 Encounter for screening for COVID-19: Secondary | ICD-10-CM | POA: Insufficient documentation

## 2023-01-13 DIAGNOSIS — R059 Cough, unspecified: Secondary | ICD-10-CM | POA: Insufficient documentation

## 2023-01-13 DIAGNOSIS — J069 Acute upper respiratory infection, unspecified: Secondary | ICD-10-CM | POA: Insufficient documentation

## 2023-01-13 LAB — SARS CORONAVIRUS 2 (TAT 6-24 HRS): SARS Coronavirus 2: NEGATIVE

## 2023-01-13 MED ORDER — BENZONATATE 100 MG PO CAPS: 100.0000 mg | ORAL_CAPSULE | Freq: Three times a day (TID) | ORAL | 0 refills | Status: DC

## 2023-01-13 NOTE — ED Provider Notes (Signed)
[place]    CSN: [id number] Arrival date & time: 01/13/23  0805      History   Chief Complaint Chief Complaint  Patient presents with   Cough    HPI Amy Snyder is a 32 y.o. female.   Patient presents urgent care for evaluation of cough, rhinorrhea, generalized headache, chills, and bodyaches that started yesterday.  No recent known sick contacts with similar symptoms.  Cough is dry and nonproductive.  She denies shortness of breath, chest pain, heart palpitations, and weakness.  She has been tolerating food and fluids well without nausea or vomiting.  She has not checked her temperature at home due to lack of thermometer but states she has had chills.  She has received 1 COVID-19 vaccine a couple of years ago and is not vaccinated against influenza this year.  She is currently afebrile but states she did take 800 mg of ibuprofen approximately 1 hour prior to arrival urgent care.  This helped with her headache and body aches.  She does not have any blurry vision, other vision changes, dizziness, or neck pain.  She is a former smoker and denies drug use.  No history of chronic respiratory problems.  She has only attempted use of Tylenol PM and ibuprofen prior to arrival urgent care and states that these medications have helped a little bit with symptoms.   Cough   Past Medical History:  Diagnosis Date   BV (bacterial vaginosis)    Endometriosis    per biopsy done 11/2016   History of cesarean section, classical 02/14/2019   Classical c section in 2010 d/t footling breech in active labor   History of preterm delivery 02/14/2019   Hypertension     Patient Active Problem List   Diagnosis Date Noted   Preoperative clearance 07/20/2022   Foot swelling 06/21/2022   Cervical dysplasia 02/26/2022   Hidradenitis 02/16/2022   Sternocleidomastoid muscle tenderness 01/25/2022   Normal pelvic exam 02/04/2021   At risk for sexually transmitted disease due to unprotected sex  02/19/2020   Endometriosis 12/06/2017   Vaginal discharge 23/55/7322   Folliculitis 02/54/2706   Essential hypertension, benign 07/03/2014   BV (bacterial vaginosis) 01/21/2014   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 Authorizing Provider  benzonatate (TESSALON) 100 MG capsule Take 1 capsule (100 mg total) by mouth every 8 (eight) hours. 01/13/23  Yes Talbot Grumbling, FNP  amLODipine (NORVASC) 10 MG tablet Take 1 tablet (10 mg total) by mouth daily. 09/21/22   [name], DO  hydrochlorothiazide (HYDRODIURIL) 25 MG tablet Take 1 tablet (25 mg total) by mouth daily. 09/21/22   [name], DO  losartan (COZAAR) 50 MG tablet TAKE 1 TABLET BY MOUTH EVERYDAY AT BEDTIME Patient not taking: Reported on 01/13/2023 07/06/22   [name], DO  potassium  chloride SA (KLOR-CON M) 20 MEQ tablet Take 2 tablets (40 mEq total) by mouth once for 1 dose. Patient not taking: Reported on 01/13/2023 07/22/22 07/22/22  Donney Dice, DO  promethazine-dextromethorphan (PROMETHAZINE-DM) 6.25-15 MG/5ML syrup Take 5 mLs by mouth 4 (four) times daily as needed for cough. Patient not taking: Reported on 01/13/2023 08/19/22   Vanessa Kick, MD    Family History Family History  Problem Relation Age of Onset   Hypertension Mother    Diabetes Other    Diabetes Maternal Grandfather     Social History Social History   Tobacco Use   Smoking status: Former    Packs/day: 0.20    Years: 3.00    Total pack years: 0.60    Types: Cigarettes    Quit date: 12/21/2019    Years since quitting: 3.0   Smokeless tobacco:  Never   Tobacco comments:    smokes socially  Vaping Use   Vaping Use: Never used  Substance Use Topics   Alcohol use: Yes    Alcohol/week: 2.0 standard drinks of alcohol    Types: 2 Shots of liquor per week    Comment: socially   Drug use: No     Allergies   Flagyl [metronidazole]   Review of Systems Review of Systems  Respiratory:  Positive for cough.   Per HPI   Physical Exam Triage Vital Signs ED Triage Vitals  Enc Vitals Group     BP 01/13/23 0824 (!) 156/93     Pulse Rate 01/13/23 0824 (!) 105     Resp 01/13/23 0824 18     Temp 01/13/23 0824 99.2 F (37.3 C)     Temp Source 01/13/23 0824 Oral     SpO2 01/13/23 0824 94 %     Weight --      Height --      Head Circumference --      Peak Flow --      Pain Score 01/13/23 0821 7     Pain Loc --      Pain Edu? --      Excl. in [place]? --    No data found.  Updated Vital Signs BP (!) 156/93 (BP Location: Left Arm) Comment: has not had blood pressure medicine today  Pulse (!) 105   Temp 99.2 F (37.3 C) (Oral)   Resp 18   LMP 01/13/2023   SpO2 94%   Visual Acuity Right Eye Distance:   Left Eye Distance:   Bilateral Distance:    Right Eye Near:   Left Eye Near:    Bilateral Near:     Physical Exam Vitals and nursing note reviewed.  Constitutional:      Appearance: She is not ill-appearing or toxic-appearing.  HENT:     Head: Normocephalic and atraumatic.     Right Ear: Hearing, tympanic membrane, ear canal and external ear normal.     Left Ear: Hearing, tympanic membrane, ear canal and external ear normal.     Nose: Rhinorrhea present.     Mouth/Throat:     Lips: Pink.     Mouth: Mucous membranes are moist.     Pharynx: No posterior oropharyngeal erythema.  Eyes:     General: Lids are normal. Vision grossly intact. Gaze aligned appropriately.        Right eye: No discharge.        Left eye: No discharge.     Extraocular Movements: Extraocular movements intact.     Conjunctiva/sclera:  Conjunctivae  normal.  Cardiovascular:     Rate and Rhythm: Normal rate and regular rhythm.     Heart sounds: Normal heart sounds, S1 normal and S2 normal.  Pulmonary:     Effort: Pulmonary effort is normal. No respiratory distress.     Breath sounds: Normal air entry. No wheezing, rhonchi or rales.  Chest:     Chest wall: No tenderness.  Musculoskeletal:     Cervical back: Neck supple.  Lymphadenopathy:     Cervical: No cervical adenopathy.  Skin:    General: Skin is warm and dry.     Capillary Refill: Capillary refill takes less than 2 seconds.     Findings: No rash.  Neurological:     General: No focal deficit present.     Mental Status: She is alert and oriented to person, place, and time. Mental status is at baseline.     Cranial Nerves: No dysarthria or facial asymmetry.  Psychiatric:        Mood and Affect: Mood normal.        Speech: Speech normal.        Behavior: Behavior normal.        Thought Content: Thought content normal.        Judgment: Judgment normal.      UC Treatments / Results  Labs (all labs ordered are listed, but only abnormal results are displayed) Labs Reviewed  SARS CORONAVIRUS 2 (TAT 6-24 HRS)    EKG   Radiology No results found.  Procedures Procedures (including critical care time)  Medications Ordered in UC Medications - No data to display  Initial Impression / Assessment and Plan / UC Course  I have reviewed the triage vital signs and the nursing notes.  Pertinent labs & imaging results that were available during my care of the patient were reviewed by me and considered in my medical decision making (see chart for details).  1.  Viral URI with cough  Symptoms and physical exam consistent with a viral upper respiratory tract infection that will likely resolve with rest, fluids, and prescriptions for symptomatic relief. Deferred imaging based on stable cardiopulmonary exam and hemodynamically stable vital signs.  COVID-19 testing is  pending.  We will call patient if this is positive.  Quarantine guidelines discussed. Currently on day 2 of symptoms and does qualify for antiviral therapy due to history of hypertension.  GFR was 120 on last BMP from July 29, 2022.  She qualifies for Paxlovid prescription if positive for COVID-19.  Tessalon Perles sent to pharmacy for symptomatic relief to be taken as prescribed.  May continue taking over the counter medications as directed for further symptomatic relief.  Nonpharmacologic interventions for symptom relief provided and after visit summary below. Advised to push fluids to stay well hydrated while recovering from viral illness.   Discussed physical exam and available lab work findings in clinic with patient.  Counseled patient regarding appropriate use of medications and potential side effects for all medications recommended or prescribed today. Discussed red flag signs and symptoms of worsening condition,when to call the PCP office, return to urgent care, and when to seek higher level of care in the emergency department. Patient verbalizes understanding and agreement with plan. All questions answered. Patient discharged in stable condition.     Final Clinical Impressions(s) / UC Diagnoses   Final diagnoses:  Viral URI with cough     Discharge Instructions      You have a viral upper respiratory infection.  COVID-19 testing  is pending. We will call you with results if positive. If your COVID test is positive, you must stay at home until day 6 of symptoms. On day 6, you may go out into public and go back to work, but you must wear a mask until day 11 of symptoms to prevent spread to others.  Use the following medicines to help with symptoms: - An antihistamine like zyrtec or claritin to help dry up some of the nasal drainage you are having. - Tylenol 1,'000mg'$  and/or ibuprofen '600mg'$  every 6 hours with food as needed for aches/pains or fever/chills.  - Tessalon perles every 8  hours as needed for cough.  1 tablespoon of honey in warm water and/or salt water gargles may also help with symptoms. Humidifier to your room will help add water to the air and reduce coughing.  If you develop any new or worsening symptoms, please return.  If your symptoms are severe, please go to the emergency room.  Follow-up with your primary care provider for further evaluation and management of your symptoms as well as ongoing wellness visits.  I hope you feel better!    ED Prescriptions     Medication Sig Dispense Auth. Provider   benzonatate (TESSALON) 100 MG capsule Take 1 capsule (100 mg total) by mouth every 8 (eight) hours. 21 capsule Talbot Grumbling, FNP      PDMP not reviewed this encounter.   Joella Prince [place], [place] 01/13/23 [phone]

## 2023-01-13 NOTE — Discharge Instructions (Addendum)
You have a viral upper respiratory infection.  COVID-19 testing is pending. We will call you with results if positive. If your COVID test is positive, you must stay at home until day 6 of symptoms. On day 6, you may go out into public and go back to work, but you must wear a mask until day 11 of symptoms to prevent spread to others.  Use the following medicines to help with symptoms: - An antihistamine like zyrtec or claritin to help dry up some of the nasal drainage you are having. - Tylenol 1,'000mg'$  and/or ibuprofen '600mg'$  every 6 hours with food as needed for aches/pains or fever/chills.  - Tessalon perles every 8 hours as needed for cough.  1 tablespoon of honey in warm water and/or salt water gargles may also help with symptoms. Humidifier to your room will help add water to the air and reduce coughing.  If you develop any new or worsening symptoms, please return.  If your symptoms are severe, please go to the emergency room.  Follow-up with your primary care provider for further evaluation and management of your symptoms as well as ongoing wellness visits.  I hope you feel better!

## 2023-01-13 NOTE — ED Triage Notes (Signed)
Complains of cough and headache that started yesterday.  Patient has a runny nose   Took motrin this morning.  Took claritin.

## 2023-03-28 ENCOUNTER — Other Ambulatory Visit (HOSPITAL_COMMUNITY)
Admission: RE | Admit: 2023-03-28 | Discharge: 2023-03-28 | Disposition: A | Discharge status: Home / Self Care | Payer: Medicaid Other | Source: Ambulatory Visit | Attending: Family Medicine | Admitting: Family Medicine

## 2023-03-28 ENCOUNTER — Ambulatory Visit (INDEPENDENT_AMBULATORY_CARE_PROVIDER_SITE_OTHER): Payer: Medicaid Other | Admitting: Family Medicine

## 2023-03-28 ENCOUNTER — Other Ambulatory Visit: Payer: Self-pay

## 2023-03-28 VITALS — BP 118/77 | HR 64 | Wt 188.2 lb

## 2023-03-28 DIAGNOSIS — Z124 Encounter for screening for malignant neoplasm of cervix: Secondary | ICD-10-CM | POA: Insufficient documentation

## 2023-03-28 DIAGNOSIS — I10 Essential (primary) hypertension: Secondary | ICD-10-CM

## 2023-03-28 DIAGNOSIS — Z113 Encounter for screening for infections with a predominantly sexual mode of transmission: Secondary | ICD-10-CM | POA: Diagnosis not present

## 2023-03-28 DIAGNOSIS — N898 Other specified noninflammatory disorders of vagina: Secondary | ICD-10-CM | POA: Diagnosis present

## 2023-03-28 LAB — POCT WET PREP (WET MOUNT)
Clue Cells Wet Prep Whiff POC: POSITIVE
Trichomonas Wet Prep HPF POC: ABSENT

## 2023-03-28 MED ORDER — CLINDAMYCIN HCL 300 MG PO CAPS: 300.0000 mg | ORAL_CAPSULE | Freq: Two times a day (BID) | ORAL | 0 refills | Status: AC

## 2023-03-28 MED ORDER — FLUCONAZOLE 150 MG PO TABS: 150.0000 mg | ORAL_TABLET | Freq: Once | ORAL | 0 refills | Status: AC

## 2023-03-28 MED ORDER — LABETALOL HCL 100 MG PO TABS
100.0000 mg | ORAL_TABLET | Freq: Two times a day (BID) | ORAL | 1 refills | Status: DC
Start: 2023-03-28 — End: 2023-10-10

## 2023-03-28 NOTE — Progress Notes (Signed)
SUBJECTIVE:   CHIEF COMPLAINT / HPI:   Vaginal Discharge: Patient is a 32 y.o. female presenting with vaginal discharge for 3 weeks.  She states the discharge is of white, thin consistency.  She endorses previous vaginal odor last Thursday but this subsided after taking boric acid suppository.  She is interested in screening for sexually transmitted infections today.   She does report having a new partner.  Did have unprotected sex with him last week again, took Plan B afterwards.  Also had unprotected intercourse yesterday, took Plan B as well.  States that both time she took Plan B was less than 24 hours after intercourse.  Is not planning on having children at this moment, but would be open to it if it occurred. She reports having a bad experience with contraception in the past.  Not interested in any methods of contraception except for barrier protection at this time.  PERTINENT  PMH / PSH: Cervical dysplasia, BV  OBJECTIVE:   BP 118/77   Pulse 64   Wt 188 lb 3.2 oz (85.4 kg)   LMP 03/07/2023   SpO2 98%   BMI 29.48 kg/m    General: NAD, pleasant, able to participate in exam Respiratory: Normal effort, no obvious respiratory distress Pelvic: VULVA: normal appearing vulva with no masses, tenderness or lesions, VAGINA: Normal appearing vagina with normal color, no lesions, with scant, white, and frothy discharge present CERVIX: No lesions, scant and white discharge present,  Chaperone Dayshia Ottley, CMA present for pelvic exam  ASSESSMENT/PLAN:   1. Routine screening for STI (sexually transmitted infection) Vaginal discharge for 3 weeks as well as history of unprotected intercourse. Did take Plan B <24 hours following both times, so low risk for pregnancy.  Physical exam significant for scant, thin, white and frothy discharge.  Wet prep performed today shows moderate clue cells, many bacteria, positive whiff consistent with BV.  Patient is interested in STI screening.   Performed oral, rectal, and vaginal swabs today for STI.  Plan: -Wet prep as above.  Will treat with clindamycin given allergy to Flagyl. -GC/chlamydia pending -Will check HIV, Hep B and RPR - POCT Wet Prep ([place]) - Cytology - PAP([place]) - Hepatitis B surface antigen - HIV antibody (with reflex) - RPR - Cervicovaginal ancillary only (oral) - Cervicovaginal ancillary only (rectal)   2. Pap smear for cervical cancer screening -Pap smear today given recommendation for 1-year follow up based on previous Pap smears per [facility]  Pap smear 02/15/2022 ASCUS, high risk HPV negative. Pap smear 12/15/2020 positive for high risk HPV, NILM Pap smear 10/31/2019 NILM - Cytology - PAP([place])  3. Vaginal discharge +BV, has allergy to flagyl. Tolerates clindamycin for BV. Will also send diflucan should she develop yeast infection afterwards. - Clindamycin 300 mg BID x 7 days - Fluconazole 150 mg x1 dose  - POCT Wet Prep [place] [place]) - Cytology - PAP([place])  4. Essential hypertension, benign Normotensive today.  Patient previously on losartan, HCTZ, amlodipine for her blood pressure.  Given that she is at high risk for pregnancy, discussed possibility of fetal abnormalities on these medications. She was not interested in any form of LARC. Has not had good experience with contraception in the past- reports trying Depo, OCP. She feels that contraception makes her body feel off. Discussed risk with continued ARB and thiazide. Decision made to switch to Labetalol in addition to her Amlodipine.  -Stop losartan/HCTZ - Start labetalol 100 mg twice daily - Continue  amlodipine 10 mg -Return in 2 weeks for blood pressure recheck -Encouraged her to start prenatal vitamins daily -Condoms provided today  Sabino Dick, DO [facility]

## 2023-03-28 NOTE — Patient Instructions (Addendum)
It was wonderful to see you today.  Please bring ALL of your medications with you to every visit.   Today we talked about:  -We are checking for sexually transmitted infections including chlamydia, gonorrhea, trichomonas, HIV, syphilis and hepatitis B. I will let you know of the results via MyChart or telephone call. We are also checking for bacterial vaginosis and yeast, which are not sexually transmitted infections.  -You should abstain from sexual activity until we have the results. If your test is positive for a sexually transmitted infection, it is important that both you and your partner are both treated.  -It is always important to use barrier protection, such as condoms, to help prevent sexually transmitted infections.    Given that you are at high risk for pregnancy, given unprotected intercourse, I would recommend taking a prenatal vitamin daily.  We will switch around your blood pressure medication.  You can continue taking amlodipine but please stop the hydrochlorothiazide and losartan.  These medications can cause fetal abnormalities.  Instead, we will start you on medication called labetalol.  Please return in 2 weeks for blood pressure check.  Thank you for coming to your visit as scheduled. We have had a large "no-show" problem lately, and this significantly limits our ability to see and care for patients. As a friendly reminder- if you cannot make your appointment please call to cancel. We do have a no show policy for those who do not cancel within 24 hours. Our policy is that if you miss or fail to cancel an appointment within 24 hours, 3 times in a 67-month period, you may be dismissed from our clinic.   Thank you for choosing [facility].   Please call [phone] with any questions about today's appointment.  Please be sure to schedule follow up at the front  desk before you leave today.   Sabino Dick, DO PGY-3 Family Medicine

## 2023-03-29 LAB — HEPATITIS B SURFACE ANTIGEN: Hepatitis B Surface Ag: NEGATIVE

## 2023-03-29 LAB — HIV ANTIBODY (ROUTINE TESTING W REFLEX): HIV Screen 4th Generation wRfx: NONREACTIVE

## 2023-03-29 LAB — RPR: RPR Ser Ql: NONREACTIVE

## 2023-03-30 LAB — CERVICOVAGINAL ANCILLARY ONLY
Chlamydia: NEGATIVE
Chlamydia: NEGATIVE
Comment: NEGATIVE
Comment: NEGATIVE
Comment: NEGATIVE
Comment: NEGATIVE
Comment: NORMAL
Comment: NORMAL
Neisseria Gonorrhea: NEGATIVE
Neisseria Gonorrhea: NEGATIVE
Trichomonas: NEGATIVE
Trichomonas: NEGATIVE

## 2023-03-31 LAB — CYTOLOGY - PAP
Chlamydia: NEGATIVE
Comment: NEGATIVE
Comment: NEGATIVE
Comment: NEGATIVE
Comment: NEGATIVE
Comment: NORMAL
Diagnosis: UNDETERMINED — AB
HPV 16: NEGATIVE
HPV 18 / 45: NEGATIVE
High risk HPV: POSITIVE — AB
Neisseria Gonorrhea: NEGATIVE

## 2023-04-11 ENCOUNTER — Ambulatory Visit: Payer: Self-pay | Admitting: Family Medicine

## 2023-07-11 ENCOUNTER — Encounter: Payer: Self-pay | Admitting: Family Medicine

## 2023-07-12 ENCOUNTER — Ambulatory Visit: Payer: Medicaid Other | Admitting: Family Medicine

## 2023-08-08 ENCOUNTER — Ambulatory Visit: Payer: Medicaid Other

## 2023-08-12 ENCOUNTER — Ambulatory Visit: Payer: Medicaid Other | Admitting: Family Medicine

## 2023-08-17 ENCOUNTER — Other Ambulatory Visit: Payer: Self-pay

## 2023-08-17 ENCOUNTER — Ambulatory Visit (INDEPENDENT_AMBULATORY_CARE_PROVIDER_SITE_OTHER): Payer: Medicaid Other | Admitting: Family Medicine

## 2023-08-17 ENCOUNTER — Encounter: Payer: Self-pay | Admitting: Family Medicine

## 2023-08-17 ENCOUNTER — Other Ambulatory Visit (HOSPITAL_COMMUNITY)
Admission: RE | Admit: 2023-08-17 | Discharge: 2023-08-17 | Disposition: A | Discharge status: Home / Self Care | Payer: Medicaid Other | Source: Ambulatory Visit | Attending: Family Medicine | Admitting: Family Medicine

## 2023-08-17 VITALS — BP 149/99 | HR 87 | Ht 67.0 in | Wt 199.4 lb

## 2023-08-17 DIAGNOSIS — Z1272 Encounter for screening for malignant neoplasm of vagina: Secondary | ICD-10-CM

## 2023-08-17 DIAGNOSIS — N898 Other specified noninflammatory disorders of vagina: Secondary | ICD-10-CM | POA: Diagnosis present

## 2023-08-17 DIAGNOSIS — R87610 Atypical squamous cells of undetermined significance on cytologic smear of cervix (ASC-US): Secondary | ICD-10-CM | POA: Insufficient documentation

## 2023-08-17 LAB — POCT WET PREP (WET MOUNT)
Clue Cells Wet Prep Whiff POC: NEGATIVE
Trichomonas Wet Prep HPF POC: ABSENT

## 2023-08-17 NOTE — Patient Instructions (Signed)
Dear Amy Snyder  Today we discussed the following concerns and plans:  Discharge: - No Yeast infection of BV identified today. - The results of your Pap smear and STI testing will take a few days. I will call you if anything is abnormal.  If you have any concerns, please call the clinic or schedule an appointment.  It was a pleasure to take care of you today. Be well!  Cyndia Skeeters, DO [facility], PGY-1

## 2023-08-17 NOTE — Assessment & Plan Note (Addendum)
Several days of discharge, itching. Similar to prior episodes of BV in the past. - Negative BV and yeast. No need for treatment at this time. - STI testing today; I will call pt with results if treatment is needed.

## 2023-08-17 NOTE — Progress Notes (Addendum)
    SUBJECTIVE:   CHIEF COMPLAINT / HPI:   Suspected Bacterial Vaginosis Pt reports frequent history of BV. She suspects this again today due to itching, thin/watery discharge for the last few days. No odor. She gets BV 3-4 times/year. She denies frequent history of yeast infections, does not usually have a problem with yeast after antibiotics. Denies UTI symptoms, denies abdominal pain. She is on day 5 of her menstrual cycle. Also interested in STI testing today.  BP also slightly elevated today at 157/101. Pt reports she forgot to take BP medicine yesterday and only just took it this AM a few minutes before her appt. BP did come down a few points on recheck to 148/99. No HA, vision changes, dizziness, chest pressure, or difficult breathing.  PERTINENT  PMH / PSH:  Past Medical History:  Diagnosis Date   At risk for sexually transmitted disease due to unprotected sex 02/19/2020   BV (bacterial vaginosis)    Endometriosis    per biopsy done 11/2016   Foot swelling 06/21/2022   History of cesarean section, classical 02/14/2019   Classical c section in 2010 d/t footling breech in active labor   History of preterm delivery 02/14/2019   Hypertension    Normal pelvic exam 02/04/2021   Preoperative clearance 07/20/2022   Sternocleidomastoid muscle tenderness 01/25/2022    OBJECTIVE:   BP (!) 149/99   Pulse 87   Ht 5\' 7"  (1.702 m)   Wt 199 lb 6.4 oz (90.4 kg)   LMP 08/12/2023   SpO2 100%   BMI 31.23 kg/m    Dr. Manson Passey and CMA Desiree present as chaperones during pelvic exam. General: Pleasant, Well-appearing, no acute distress. Genital: Normal introitus for age, no external lesions. Scant menstrual blood in vaginal canal and cervical os. Mucosa moist and pink. Normal uterus size and position. No cervical motion tenderness.   ASSESSMENT/PLAN:   Vaginal discharge Several days of discharge, itching. Similar to prior episodes of BV in the past. - Negative BV and yeast. No need for  treatment at this time. - STI testing today; I will call pt with results if treatment is needed.  Follow-up vaginal Pap smear Patient with history of ASC-US. Due for Pap smear today. Patient agreeable. - will call patient with abnormal results.     Cyndia Skeeters, DO [place] [facility]

## 2023-08-17 NOTE — Assessment & Plan Note (Addendum)
Patient with history of ASC-US. Due for Pap smear today. Patient agreeable. - will call patient with abnormal results.

## 2023-08-25 LAB — CYTOLOGY - PAP
Chlamydia: NEGATIVE
Comment: NEGATIVE
Comment: NEGATIVE
Comment: NEGATIVE
Comment: NORMAL
Diagnosis: NEGATIVE
Diagnosis: REACTIVE
High risk HPV: POSITIVE — AB
Neisseria Gonorrhea: NEGATIVE
Trichomonas: NEGATIVE

## 2023-09-22 ENCOUNTER — Ambulatory Visit (INDEPENDENT_AMBULATORY_CARE_PROVIDER_SITE_OTHER): Payer: Self-pay | Admitting: Family Medicine

## 2023-09-22 VITALS — BP 118/70 | HR 88 | Wt 196.0 lb

## 2023-09-22 DIAGNOSIS — B977 Papillomavirus as the cause of diseases classified elsewhere: Secondary | ICD-10-CM

## 2023-09-22 DIAGNOSIS — R87618 Other abnormal cytological findings on specimens from cervix uteri: Secondary | ICD-10-CM

## 2023-09-22 LAB — POCT URINE PREGNANCY: Preg Test, Ur: NEGATIVE

## 2023-09-22 NOTE — Progress Notes (Signed)
Abnormal Pap smear:  Patient given informed consent, signed copy in the chart.  Placed in lithotomy position. Cervix viewed with speculum and colposcope after application of acetic acid.   Colposcopy adequate (entire squamocolumnar junctions seen  in entirety) ?  Yes Acetowhite lesions?  No Punctation?  None Mosaicism?  None Abnormal vasculature?  None Biopsies?  No ECC?  No Complications?  No  COMMENTS: Patient was given post procedure instructions.  No restrictions to activity.  Recommend follow-up Pap smear in 1 year. 2.  She has some questions about her diagnosis of endometriosis.  Had previously been seen by [facility] and is a patient of their practice so I told her to call them and see if she get an appointment.  If she needs a referral, she will let me know.  She is interested in some surgical options.

## 2023-10-10 ENCOUNTER — Other Ambulatory Visit: Payer: Self-pay

## 2023-10-10 DIAGNOSIS — I10 Essential (primary) hypertension: Secondary | ICD-10-CM

## 2023-10-11 MED ORDER — AMLODIPINE BESYLATE 10 MG PO TABS
10.0000 mg | ORAL_TABLET | Freq: Every day | ORAL | 3 refills | Status: DC
Start: 2023-10-11 — End: 2023-12-15

## 2023-10-11 MED ORDER — LABETALOL HCL 100 MG PO TABS: 100.0000 mg | ORAL_TABLET | Freq: Two times a day (BID) | ORAL | 1 refills | Status: DC

## 2023-11-22 ENCOUNTER — Encounter: Payer: Self-pay | Admitting: Obstetrics and Gynecology

## 2023-11-22 ENCOUNTER — Ambulatory Visit (INDEPENDENT_AMBULATORY_CARE_PROVIDER_SITE_OTHER): Payer: Self-pay | Admitting: Obstetrics and Gynecology

## 2023-11-22 VITALS — BP 137/87 | HR 75 | Ht 67.0 in | Wt 199.9 lb

## 2023-11-22 DIAGNOSIS — I10 Essential (primary) hypertension: Secondary | ICD-10-CM

## 2023-11-22 DIAGNOSIS — N809 Endometriosis, unspecified: Secondary | ICD-10-CM

## 2023-11-22 MED ORDER — SLYND 4 MG PO TABS: 4.0000 mg | ORAL_TABLET | Freq: Every day | ORAL | 3 refills | Status: DC

## 2023-11-22 NOTE — Progress Notes (Signed)
Pt. Presents for follow up

## 2023-11-22 NOTE — Progress Notes (Signed)
   GYN EXAM Patient name: Amy Snyder MRN [id number]  Date of birth: Nov 22, 1991 Chief Complaint:   Follow-up  History of Present Illness:   Amy Snyder is a 32 y.o. [phone] African-American female being seen today for a gyn problem visit.  Current complaints: Known endometriosis with endometrioma on right rectus. Symptoms of abdominal pain with periods and severe cramping have worsened overtime. Periods roughly every month. NSAIDs no longer particularly helpful. No current sexual partner. Uses barrier protection when active. Has not liked side effects with OCPs, depo in the past. On losartan, hydrochlorothiazide, amlodipine for HTN.  Colpo 10/24.  Patient's last menstrual period was 10/30/2023 (exact date).   Review of Systems:   Pertinent items are noted in HPI Denies any headaches, blurred vision, fatigue, shortness of breath, chest pain, abdominal pain, abnormal vaginal discharge/itching/odor/irritation, problems with periods, bowel movements, urination, or intercourse unless otherwise stated above. Pertinent History Reviewed:  Reviewed past medical,surgical, social and family history.  Reviewed problem list, medications and allergies. Physical Assessment:   Vitals:   11/22/23 0951  BP: 137/87  Pulse: 75  Weight: 199 lb 14.4 oz (90.7 kg)  Height: 5\' 7"  (1.702 m)  Body mass index is 31.31 kg/m.        Physical Examination:   General appearance - well appearing, and in no distress  Mental status - alert, oriented to person, place, and time  Psych:  She has a normal mood and affect  Skin - warm and dry, normal color, no suspicious lesions noted  Chest - effort normal  Heart - normal rate and regular rhythm  Neck:  midline trachea, no thyromegaly or nodules  Extremities:  No swelling or varicosities noted  No results found for this or any previous visit (from the past 24 hour(s)).  Assessment & Plan:   1. Endometriosis Known diagnosis. Has not been on any treatment for  some time. Discussed options for treatment including hormonal contraceptive pills, IUD, surgery, GnRH antagonists. Discussed that estrogen would be relatively contraindicated for her given current HTN (on three agents) and age as this would increase stroke risk. - Patient amenable to starting Slynd continuously to suppress periods - Will return in 3 months. Would consider IUD at that time if Slynd not helpful enough - States she has not been recently sexually active and declines need for UPT  2. Essential hypertension, benign Managed per PCP. Well-controlled today.  No orders of the defined types were placed in this encounter.   Meds:  Meds ordered this encounter  Medications   Drospirenone (SLYND) 4 MG TABS    Sig: Take 1 tablet (4 mg total) by mouth daily.    Dispense:  90 tablet    Refill:  3    Follow-up: Return in about 3 months (around 02/20/2024) for Endo.  Joanne Gavel, MD 11/22/2023 12:46 PM

## 2023-12-15 ENCOUNTER — Telehealth: Payer: Self-pay

## 2023-12-15 DIAGNOSIS — I10 Essential (primary) hypertension: Secondary | ICD-10-CM

## 2023-12-15 MED ORDER — LABETALOL HCL 100 MG PO TABS
100.0000 mg | ORAL_TABLET | Freq: Two times a day (BID) | ORAL | 0 refills | Status: DC
Start: 2023-12-15 — End: 2023-12-27

## 2023-12-15 MED ORDER — AMLODIPINE BESYLATE 10 MG PO TABS
10.0000 mg | ORAL_TABLET | Freq: Every day | ORAL | 3 refills | Status: DC
Start: 2023-12-15 — End: 2023-12-27

## 2023-12-15 NOTE — Telephone Encounter (Signed)
Patient calls nurse line requesting refills on hydrochlorothiazide and labetalol.   I do not see these on her medication list.   Will forward to PCP.   She reports she has refills on amlodipine.

## 2023-12-16 NOTE — Telephone Encounter (Signed)
Called patient and advised of provider message.   Scheduled for Tuesday, 12/27/23 for BP follow up.   Veronda Prude, RN

## 2023-12-20 ENCOUNTER — Telehealth: Payer: Self-pay | Admitting: Obstetrics and Gynecology

## 2023-12-20 ENCOUNTER — Encounter: Payer: Self-pay | Admitting: Obstetrics and Gynecology

## 2023-12-20 DIAGNOSIS — N921 Excessive and frequent menstruation with irregular cycle: Secondary | ICD-10-CM

## 2023-12-20 MED ORDER — MEDROXYPROGESTERONE ACETATE 10 MG PO TABS
10.0000 mg | ORAL_TABLET | Freq: Every day | ORAL | 0 refills | Status: DC
Start: 2023-12-20 — End: 2024-02-27

## 2023-12-20 NOTE — Progress Notes (Signed)
Pt reports heavy bleeding with Slynd for 16 days. Pt stopped meds 3 days. Pt unsure if she wants to continue [facility].

## 2023-12-20 NOTE — Progress Notes (Signed)
  I connected with Amy Snyder 02/14/23 at  1:30 PM EST by: MyChart video and verified that I am speaking with the correct person using two identifiers.  Patient is located at home and provider is located at [facility].     I discussed the limitations, risks, security and privacy concerns of performing an evaluation and management service by MyChart video and the availability of in person appointments. I also discussed with the patient that there may be a patient responsible charge related to this service. By engaging in this virtual visit, you consent to the provision of healthcare.  Additionally, you authorize for your insurance to be billed for the services provided during this visit.  The patient expressed understanding and agreed to proceed.   The following staff members participated in the virtual visit:         GYNECOLOGY PROGRESS NOTE  History:  32 y.o. G2P0111 presents to [facility] office today for problem gyn visit. Reports on slynd  for birth control  and has been bleeding the past 16 days. Stopped meds three days ago and still bleeding. Reports heavy flow with clots. Tried depo provera  in the past that had the same affect on cycles.   The following portions of the patient's history were reviewed and updated as appropriate: allergies, current medications, past family history, past medical history, past social history, past surgical history and problem list. Last pap smear on 07/2023 was normal, positive HRHPV.  Health Maintenance Due  Topic Date Due   DTaP/Tdap/Td (1 - Tdap) Never done   COVID-19 Vaccine (2 - 2024-25 season) 08/21/2023     Review of Systems:  Pertinent items are noted in HPI.   Objective:  Physical Exam Last menstrual period 10/30/2023. VS reviewed, nursing note reviewed,  Constitutional: well developed, well nourished, no distress Neuro: alert and oriented  Psych: affect normal   Assessment & Plan:  1. Menorrhagia with irregular cycle  (Primary) Disucussed provera  for 10 days to stop the bleeding. Discussed methods of birth control, depo, nexplanon and IUD, to help with bleeding. She does not desire birth control at this time. Instructed to follow up  if bleeding does not stop.  Will follow up in a few months to assess bleeding.  - medroxyPROGESTERone  (PROVERA ) 10 MG tablet; Take 1 tablet (10 mg total) by mouth daily.  Dispense: 10 tablet; Refill: 0   Return in about 3 months (around 03/19/2024), or bleeding follow up.   Nidia Daring, FNP

## 2023-12-27 ENCOUNTER — Encounter: Payer: Self-pay | Admitting: Family Medicine

## 2023-12-27 ENCOUNTER — Ambulatory Visit (INDEPENDENT_AMBULATORY_CARE_PROVIDER_SITE_OTHER): Payer: BLUE CROSS/BLUE SHIELD | Admitting: Family Medicine

## 2023-12-27 VITALS — BP 141/91 | HR 70 | Wt 199.6 lb

## 2023-12-27 DIAGNOSIS — I10 Essential (primary) hypertension: Secondary | ICD-10-CM | POA: Diagnosis not present

## 2023-12-27 MED ORDER — OLMESARTAN-AMLODIPINE-HCTZ 40-10-12.5 MG PO TABS
1.0000 | ORAL_TABLET | Freq: Every day | ORAL | 0 refills | Status: DC
Start: 2023-12-27 — End: 2023-12-29

## 2023-12-27 NOTE — Progress Notes (Signed)
    SUBJECTIVE:   CHIEF COMPLAINT / HPI:   Blood pressure follow up  No leg swelling, no light headedness or dizziness with other meds   Has not taken hydrochlorothiazide  in 3 weeks  Missed couple days of amlodipine  last week since she needed the refill  Patient was started on labetalol  by a surgeon when she was getting evaluated for cosmetic surgery. Thus at one point she was on labetalol , amlodipine , and hydrochlorothiazide .  She has been taking labetalol  consistently BID.  Family history of HTN. NO history herself of kidney disease.  No headaches, chest pain, shortness of breath   PERTINENT  PMH / PSH: HTN   OBJECTIVE:   BP (!) 141/91 (BP Location: Left Arm, Patient Position: Sitting, Cuff Size: Normal)   Pulse 70   Wt 199 lb 9.6 oz (90.5 kg)   SpO2 100%   BMI 31.26 kg/m   General: well appearing, in no acute distress CV: RRR, radial pulses equal and palpable, no BLE edema  Resp: Normal work of breathing on room air, CTAB Abd: Soft, non tender, non distended, no abdominal bruit.  Neuro: Alert & Oriented x 4    ASSESSMENT/PLAN:   Assessment & Plan Essential hypertension, benign Most likely abnormal BP in the setting of lack of medications. Patient is on 3 medications at a young age. No evidence of secondary causes of htn.  - Will change medications to add renal protection with ARB. Change regimen from 3 separate medications (labetalol , amlodipine , and hydrochlorothiazide ) to combo olmesartan -amlodipine -hydrochlorothiazide    - can consider taking out one of the components of the combo if patient's blood pressure is over treated at next appointment.       Areta Saliva, MD [facility]

## 2023-12-27 NOTE — Patient Instructions (Signed)
 It was wonderful to see you today.  Please bring ALL of your medications with you to every visit.   Today we talked about:  Blood pressure - I have changed your blood pressure medication. Please stop the old ones and start the new medication. We will recheck your blood pressure and labs at the next visit.    Thank you for choosing [facility].   Please call [phone] with any questions about today's appointment.  Please be sure to schedule follow up at the front desk before you leave today.   Areta Saliva, MD  Family Medicine

## 2023-12-29 ENCOUNTER — Telehealth: Payer: Self-pay

## 2023-12-29 DIAGNOSIS — I10 Essential (primary) hypertension: Secondary | ICD-10-CM

## 2023-12-29 LAB — BASIC METABOLIC PANEL
BUN/Creatinine Ratio: 15 (ref 9–23)
BUN: 9 mg/dL (ref 6–20)
CO2: 21 mmol/L (ref 20–29)
Calcium: 9.7 mg/dL (ref 8.7–10.2)
Chloride: 101 mmol/L (ref 96–106)
Creatinine, Ser: 0.61 mg/dL (ref 0.57–1.00)
Glucose: 100 mg/dL — ABNORMAL HIGH (ref 70–99)
Potassium: 4.2 mmol/L (ref 3.5–5.2)
Sodium: 139 mmol/L (ref 134–144)
eGFR: 121 mL/min/{1.73_m2} (ref >59)

## 2023-12-29 MED ORDER — OLMESARTAN-AMLODIPINE-HCTZ 40-10-12.5 MG PO TABS: 1.0000 | ORAL_TABLET | Freq: Every day | ORAL | 0 refills | Status: DC

## 2023-12-29 NOTE — Telephone Encounter (Signed)
 Patient calls nurse line regarding issues with picking up new BP medication, olmesartan-amlodipine- hydrochlorothiazide.   Called pharmacy. They report that they never received prescription.   Resent electronically.   Veronda Prude, RN

## 2024-01-03 NOTE — Assessment & Plan Note (Signed)
 Most likely abnormal BP in the setting of lack of medications. Patient is on 3 medications at a young age. No evidence of secondary causes of htn.  - Will change medications to add renal protection with ARB. Change regimen from 3 separate medications (labetalol , amlodipine , and hydrochlorothiazide ) to combo olmesartan -amlodipine -hydrochlorothiazide    - can consider taking out one of the components of the combo if patient's blood pressure is over treated at next appointment.

## 2024-01-06 ENCOUNTER — Ambulatory Visit: Payer: Self-pay | Admitting: Family Medicine

## 2024-01-09 ENCOUNTER — Encounter: Payer: Self-pay | Admitting: Family Medicine

## 2024-01-09 ENCOUNTER — Ambulatory Visit: Payer: BLUE CROSS/BLUE SHIELD | Admitting: Family Medicine

## 2024-01-09 VITALS — BP 112/78 | Ht 67.0 in | Wt 201.0 lb

## 2024-01-09 DIAGNOSIS — I10 Essential (primary) hypertension: Secondary | ICD-10-CM

## 2024-01-09 NOTE — Assessment & Plan Note (Signed)
112/78 in clinic, well-controlled on current therapy of olmesartan-amlodipine-HCTZ.  Continue current regimen and will obtain BMP at lab appointment.

## 2024-01-09 NOTE — Patient Instructions (Signed)
It was wonderful to see you today! Thank you for choosing [facility].   Please bring ALL of your medications with you to every visit.   Today we talked about:  Please continue to take your blood pressure medication as prescribed.  I would recommend checking your blood pressure at home 1-2 times per week with a goal blood pressure under 130/80.  If you have any further concerns about your blood pressure please return to the office to discuss further.  Please follow up in 3 months for blood pressure management  If you haven't already, sign up for My Chart to have easy access to your labs results, and communication with your primary care physician.   We are checking some labs today. If they are abnormal, I will call you. If they are normal, I will send you a MyChart message (if it is active) or a letter in the mail. If you do not hear about your labs in the next 2 weeks, please call the office.  Call the clinic at [phone] if your symptoms worsen or you have any concerns.  Please be sure to schedule follow up at the front desk before you leave today.   Elberta Fortis, DO Family Medicine

## 2024-01-09 NOTE — Progress Notes (Signed)
Virtual Visit via Telephone Note  I connected with Amy Snyder on 01/09/24 at  3:50 PM EST by telephone and verified that I am speaking with the correct person using two identifiers.  Location: Patient: Amy Snyder -car Provider: Elberta Fortis, DO -[facility]   I discussed the limitations, risks, security and privacy concerns of performing an evaluation and management service by telephone and the availability of in person appointments. I also discussed with the patient that there may be a patient responsible charge related to this service. The patient expressed understanding and agreed to proceed.   History of Present Illness: Hypertension: - Medications: Olmesartan-amlodipine-HCTZ - Compliance: Yes - Checking BP at home: No, has home cuff available - Denies any SOB, CP, vision changes, LE edema, medication SEs, or symptoms of hypotension  *Patient had to leave emergently before seen by physician, had intake and vitals performed.   Observations/Objective: Today's Vitals   01/09/24 1552  BP: 112/78  Weight: 201 lb (91.2 kg)  Height: 5\' 7"  (1.702 m)  PainSc: 0-No pain   Body mass index is 31.48 kg/m.   Assessment and Plan: Assessment & Plan Essential hypertension, benign 112/78 in clinic, well-controlled on current therapy of olmesartan-amlodipine-HCTZ.  Continue current regimen and will obtain BMP at lab appointment.   Follow Up Instructions: -Scheduled lab visit for 1/27 to obtain BMP    Elberta Fortis, MD

## 2024-01-16 ENCOUNTER — Other Ambulatory Visit: Payer: BLUE CROSS/BLUE SHIELD

## 2024-01-16 DIAGNOSIS — I10 Essential (primary) hypertension: Secondary | ICD-10-CM

## 2024-01-17 ENCOUNTER — Encounter: Payer: Self-pay | Admitting: Family Medicine

## 2024-01-17 LAB — BASIC METABOLIC PANEL
BUN/Creatinine Ratio: 13 (ref 9–23)
BUN: 8 mg/dL (ref 6–20)
CO2: 24 mmol/L (ref 20–29)
Calcium: 9.2 mg/dL (ref 8.7–10.2)
Chloride: 100 mmol/L (ref 96–106)
Creatinine, Ser: 0.64 mg/dL (ref 0.57–1.00)
Glucose: 97 mg/dL (ref 70–99)
Potassium: 3.7 mmol/L (ref 3.5–5.2)
Sodium: 139 mmol/L (ref 134–144)
eGFR: 120 mL/min/{1.73_m2} (ref >59)

## 2024-01-25 ENCOUNTER — Ambulatory Visit: Payer: Self-pay | Admitting: Internal Medicine

## 2024-01-28 ENCOUNTER — Other Ambulatory Visit: Payer: Self-pay | Admitting: Family Medicine

## 2024-01-28 DIAGNOSIS — I10 Essential (primary) hypertension: Secondary | ICD-10-CM

## 2024-02-01 ENCOUNTER — Other Ambulatory Visit: Payer: Self-pay

## 2024-02-01 ENCOUNTER — Ambulatory Visit: Payer: BLUE CROSS/BLUE SHIELD | Admitting: Internal Medicine

## 2024-02-01 ENCOUNTER — Encounter: Payer: Self-pay | Admitting: Internal Medicine

## 2024-02-01 VITALS — BP 130/80 | HR 72 | Temp 98.4°F | Resp 16 | Ht 67.01 in | Wt 203.0 lb

## 2024-02-01 DIAGNOSIS — J3089 Other allergic rhinitis: Secondary | ICD-10-CM | POA: Diagnosis not present

## 2024-02-01 NOTE — Patient Instructions (Addendum)
Other Allergic Rhinitis: - Hold all anti-histamines (Xyzal, Allegra, Zyrtec, Claritin, Benadryl, Pepcid) 3 days prior to next visit.   Follow up: 2/20 at 1:30 PM for skin testing.

## 2024-02-01 NOTE — Progress Notes (Signed)
NEW PATIENT  Date of Service/Encounter:  02/01/24  Consult requested by: Lockie Mola, MD   Subjective:   Amy Snyder (DOB: [birth year]) is a 33 y.o. female who presents to the clinic on 02/01/2024 with a chief complaint of Allergies and Establish Care .    History obtained from: chart review and patient.   Rhinitis:  Started the last few years.   Symptoms include: nasal congestion, rhinorrhea, post nasal drainage, and sneezing, cough, chin itching   Occurs year-round Potential triggers: not sure but possibly dust   Treatments tried:  PRN anti histamines OTC  Previous allergy testing: no History of sinus surgery: no Nonallergic triggers: none     Reviewed:  01/09/2024: seen by Dr Ardyth Harps for HTN, on olmesartan, hydrochlorothiazide, amlodipine.   11/22/2023: seen by Dr Earlene Plater obgyn for endometriosis and HTN. Discussed starting Slynd.     01/13/2023: seen in ED for viral URI with cough,no wheezing on exam.  Past Medical History: Past Medical History:  Diagnosis Date   At risk for sexually transmitted disease due to unprotected sex 02/19/2020   BV (bacterial vaginosis)    Endometriosis    per biopsy done 11/2016   Foot swelling 06/21/2022   History of cesarean section, classical 02/14/2019   Classical c section in 2010 d/t footling breech in active labor   History of preterm delivery 02/14/2019   Hypertension    Normal pelvic exam 02/04/2021   Preoperative clearance 07/20/2022   Sternocleidomastoid muscle tenderness 01/25/2022   Past Surgical History: Past Surgical History:  Procedure Laterality Date   CESAREAN SECTION  10/21/2009   Classical c/s     Family History: Family History  Problem Relation Age of Onset   Hypertension Mother    Diabetes Other    Diabetes Maternal Grandfather     Social History:  Flooring in bedroom: Engineer, civil (consulting) Pets: dog Tobacco use/exposure: none Job: postal service   Medication List:  Allergies as of 02/01/2024        Reactions   Flagyl [metronidazole] Itching, Swelling, Rash   Pt reports she is able to use Metronidazole gel without problem or reaction        Medication List        Accurate as of February 01, 2024  3:20 PM. If you have any questions, ask your nurse or doctor.          medroxyPROGESTERone 10 MG tablet Commonly known as: PROVERA Take 1 tablet (10 mg total) by mouth daily.   Olmesartan-amLODIPine-HCTZ 40-10-12.5 MG Tabs TAKE 1 TABLET BY MOUTH EVERY DAY   Slynd 4 MG Tabs Generic drug: Drospirenone Take 1 tablet (4 mg total) by mouth daily.         REVIEW OF SYSTEMS: Pertinent positives and negatives discussed in HPI.   Objective:   Physical Exam: BP 130/80 (BP Location: Right Arm, Patient Position: Sitting, Cuff Size: Normal)   Pulse 72   Temp 98.4 F (36.9 C) (Temporal)   Resp 16   Ht 5' 7.01" (1.702 m)   Wt 203 lb (92.1 kg)   LMP 12/26/2023   SpO2 100%   BMI 31.79 kg/m  Body mass index is 31.79 kg/m. GEN: alert, well developed HEENT: clear conjunctiva, nose with + mild inferior turbinate hypertrophy, pink nasal mucosa, slight clear rhinorrhea, no cobblestoning HEART: regular rate and rhythm, no murmur LUNGS: clear to auscultation bilaterally, no coughing, unlabored respiration ABDOMEN: soft, non distended  SKIN: no rashes or lesions  Assessment:   1. Other allergic rhinitis  Plan/Recommendations:  Other Allergic Rhinitis: - Due to turbinate hypertrophy, seasonal symptoms and unresponsive to over the counter meds, will perform skin testing to identify aeroallergen triggers.   - Hold all anti-histamines (Xyzal, Allegra, Zyrtec, Claritin, Benadryl, Pepcid) 3 days prior to next visit.   Follow up: 2/20 at 1:30 PM for skin testing 1-55    Alesia Morin, MD [facility]

## 2024-02-09 ENCOUNTER — Ambulatory Visit: Payer: BLUE CROSS/BLUE SHIELD | Admitting: Internal Medicine

## 2024-02-09 DIAGNOSIS — J3081 Allergic rhinitis due to animal (cat) (dog) hair and dander: Secondary | ICD-10-CM

## 2024-02-09 DIAGNOSIS — J3089 Other allergic rhinitis: Secondary | ICD-10-CM | POA: Diagnosis not present

## 2024-02-09 DIAGNOSIS — J301 Allergic rhinitis due to pollen: Secondary | ICD-10-CM | POA: Diagnosis not present

## 2024-02-09 MED ORDER — AZELASTINE HCL 0.1 % NA SOLN: 2.0000 | Freq: Two times a day (BID) | NASAL | 5 refills | Status: DC | PRN

## 2024-02-09 MED ORDER — FLUTICASONE PROPIONATE 50 MCG/ACT NA SUSP: 2.0000 | Freq: Every day | NASAL | 5 refills | Status: DC

## 2024-02-09 MED ORDER — LEVOCETIRIZINE DIHYDROCHLORIDE 5 MG PO TABS: 5.0000 mg | ORAL_TABLET | Freq: Every evening | ORAL | 5 refills | Status: DC

## 2024-02-09 NOTE — Patient Instructions (Addendum)
Allergic Rhinitis: - Positive skin test 01/2024: trees, grasses, weeds, molds, dust mites, cats, dogs, cockroach  - Avoidance measures discussed. - Use nasal saline rinses before nose sprays such as with Neilmed Sinus Rinse.  Use distilled water.   - Use Flonase 2 sprays each nostril daily. Aim upward and outward. - Use Azelastine 2 sprays each nostril twice daily as needed for runny nose, drainage, sneezing, congestion. Aim upward and outward. - Use Xyzal 5 mg daily.  - Consider allergy shots as long term control of your symptoms by teaching your immune system to be more tolerant of your allergy triggers   ALLERGEN AVOIDANCE MEASURES   Dust Mites Use central air conditioning and heat; and change the filter monthly.  Pleated filters work better than mesh filters.  Electrostatic filters may also be used; wash the filter monthly.  Window air conditioners may be used, but do not clean the air as well as a central air conditioner.  Change or wash the filter monthly. Keep windows closed.  Do not use attic fans.   Encase the mattress, box springs and pillows with zippered, dust proof covers. Wash the bed linens in hot water weekly.   Remove carpet, especially from the bedroom. Remove stuffed animals, throw pillows, dust ruffles, heavy drapes and other items that collect dust from the bedroom. Do not use a humidifier.   Use wood, vinyl or leather furniture instead of cloth furniture in the bedroom. Keep the indoor humidity at 30 - 40%.    Molds - Indoor avoidance Use air conditioning to reduce indoor humidity.  Do not use a humidifier. Keep indoor humidity at 30 - 40%.  Use a dehumidifier if needed. In the bathroom use an exhaust fan or open a window after showering.  Wipe down damp surfaces after showering.  Clean bathrooms with a mold-killing solution (diluted bleach, or products like Tilex, etc) at least once a month. In the kitchen use an exhaust fan to remove steam from cooking.  Throw away  spoiled foods immediately, and empty garbage daily.  Empty water pans below self-defrosting refrigerators frequently. Vent the clothes dryer to the outside. Limit indoor houseplants; mold grows in the dirt.  No houseplants in the bedroom. Remove carpet from the bedroom. Encase the mattress and box springs with a zippered encasing.  Molds - Outdoor avoidance Avoid being outside when the grass is being mowed, or the ground is tilled. Avoid playing in leaves, pine straw, hay, etc.  Dead plant materials contain mold. Avoid going into barns or grain storage areas. Remove leaves, clippings and compost from around the home.  Cockroach Limit spread of food around the house; especially keep food out of bedrooms. Keep food and garbage in closed containers with a tight lid.  Never leave food out in the kitchen.  Do not leave out pet food or dirty food bowls. Mop the kitchen floor and wash countertops at least once a week. Repair leaky pipes and faucets so there is no standing water to attract roaches. Plug up cracks in the house through which cockroaches can enter. Use bait stations and approved pesticides to reduce cockroach infestation. Pollen Avoidance Pollen levels are highest during the mid-day and afternoon.  Consider this when planning outdoor activities. Avoid being outside when the grass is being mowed, or wear a mask if the pollen-allergic person must be the one to mow the grass. Keep the windows closed to keep pollen outside of the home. Use an air conditioner to filter the air. Take  a shower, wash hair, and change clothing after working or playing outdoors during pollen season. Pet Dander- Cats/Dogs  Keep the pet out of your bedroom and restrict it to only a few rooms. Be advised that keeping the pet in only one room will not limit the allergens to that room. Don't pet, hug or kiss the pet; if you do, wash your hands with soap and water. High-efficiency particulate air (HEPA) cleaners  run continuously in a bedroom or living room can reduce allergen levels over time. Regular use of a high-efficiency vacuum cleaner or a central vacuum can reduce allergen levels. Giving your pet a bath at least once a week can reduce airborne allergen.

## 2024-02-09 NOTE — Progress Notes (Signed)
FOLLOW UP Date of Service/Encounter:  02/09/24   Subjective:  Amy Snyder (DOB: [birth year]) is a 33 y.o. female who [facility] for follow up for skin testing.   History obtained from: chart review and patient.  Anti histamines held.   Past Medical History: Past Medical History:  Diagnosis Date   At risk for sexually transmitted disease due to unprotected sex 02/19/2020   BV (bacterial vaginosis)    Endometriosis    per biopsy done 11/2016   Foot swelling 06/21/2022   History of cesarean section, classical 02/14/2019   Classical c section in 2010 d/t footling breech in active labor   History of preterm delivery 02/14/2019   Hypertension    Normal pelvic exam 02/04/2021   Preoperative clearance 07/20/2022   Sternocleidomastoid muscle tenderness 01/25/2022    Objective:  There were no vitals taken for this visit. There is no height or weight on file to calculate BMI. Physical Exam: GEN: alert, well developed HEENT: clear conjunctiva, MMM LUNGS: unlabored respiration  Skin Testing:  Skin prick testing was placed, which includes aeroallergens/foods, histamine control, and saline control.  Verbal consent was obtained prior to placing test.  Patient tolerated procedure well.  Allergy testing results were read and interpreted by myself, documented by clinical staff. Adequate positive and negative control.  Positive results to:  Results discussed with patient/family.  Airborne Adult Perc - 02/09/24 1402     Time Antigen Placed 1402    Allergen Manufacturer Waynette Buttery    Location Back    Number of Test 55    1. Control-Buffer 50% Glycerol Negative    2. Control-Histamine 2+    3. Bahia Negative    4. [place] 2+    5. Johnson Negative    6. Kentucky Blue Negative    7. Meadow Fescue Negative    8. Perennial Rye Negative    9. Timothy Negative    10. Ragweed Mix Negative    11. Cocklebur 2+    12. Plantain,  English Negative     13. Baccharis Negative    14. Dog Fennel 2+    15. Russian Thistle Negative    16. Lamb's Quarters Negative    17. Sheep Sorrell Negative    18. Rough Pigweed Negative    19. Marsh Elder, Rough Negative    20. Mugwort, Common Negative    21. Box, Elder Negative    22. Cedar, red Negative    23. Sweet Gum Negative    24. Pecan Pollen Negative    25. Pine Mix Negative    26. Walnut, Black Pollen Negative    27. Red Mulberry Negative    28. Ash Mix Negative    29. Birch Mix 3+    30. Beech American 2+    31. Cottonwood, Guinea-Bissau Negative    32. Hickory, White Negative    33. Maple Mix Negative    34. Oak, [place] Mix Negative    35. Sycamore Eastern Negative    36. Alternaria Alternata Negative    37. Cladosporium Herbarum Negative    38. Aspergillus Mix Negative    39. Penicillium Mix 3+    40. Bipolaris Sorokiniana (Helminthosporium) 2+    41. Drechslera Spicifera (Curvularia) Negative    42. Mucor Plumbeus 2+    43. Fusarium Moniliforme Negative    44. Aureobasidium Pullulans (pullulara) Negative    45. Rhizopus Oryzae Negative    46. Botrytis Cinera Negative  47. Epicoccum Nigrum Negative    48. Phoma Betae Negative    49. Dust Mite Mix 3+    50. Cat Hair 10,000 BAU/ml 3+    51.  Dog Epithelia 2+    52. Mixed Feathers Negative    53. Horse Epithelia Negative    54. Cockroach, German 3+    55. Tobacco Leaf Negative              Assessment:   No diagnosis found.  Plan/Recommendations:  Allergic Rhinitis: - Due to turbinate hypertrophy, seasonal symptoms and unresponsive to over the counter meds, will perform skin testing to identify aeroallergen triggers.   - Positive skin test 01/2024: trees, grasses, weeds, molds, dust mites, cats, dogs, cockroach  - Avoidance measures discussed. - Use nasal saline rinses before nose sprays such as with Neilmed Sinus Rinse.  Use distilled water.   - Use Flonase 2 sprays each nostril daily. Aim upward and outward. -  Use Azelastine 2 sprays each nostril twice daily as needed for runny nose, drainage, sneezing, congestion. Aim upward and outward. - Use Xyzal 5 mg daily.  - Consider allergy shots as long term control of your symptoms by teaching your immune system to be more tolerant of your allergy triggers     Return in about 2 months (around 04/08/2024).  Alesia Morin, MD [facility]

## 2024-02-27 ENCOUNTER — Ambulatory Visit: Payer: BLUE CROSS/BLUE SHIELD | Admitting: Obstetrics and Gynecology

## 2024-02-27 VITALS — BP 119/70 | HR 70 | Wt 205.0 lb

## 2024-02-27 DIAGNOSIS — N80C19 Endometriosis of the anterior abdominal wall, unspecified depth: Secondary | ICD-10-CM

## 2024-02-27 MED ORDER — NORETHINDRONE ACETATE 5 MG PO TABS: 5.0000 mg | ORAL_TABLET | Freq: Every day | ORAL | 2 refills | Status: AC

## 2024-02-27 NOTE — Progress Notes (Unsigned)
   RETURN GYNECOLOGY VISIT  Subjective:  Ebelin Dillehay is a 33 y.o. [id number] with LMP 02/08/24 presenting for endometriosis follow up  Has known implant of endometriosis involving her rectus muscle. She underwent a classical CS in 2010 and nodule was noted by 2012. Biopsied confirmed endometriosis implant in 2017. Gets cyclic pain over that area and feels like it is getting bigger/more painful. Has hx HTN so is not a candidate for estrogen containing methods. Tried slynd 11/2023 but had issues with irregular/breakthrough bleeding so stopped taking it. Had not been on hormonal medications for suppression prior to this. Was managing with NSAIDs.   Had been referred to general surgery for surgical excision but did not follow up.   I personally reviewed the following: - MRI A/P 10/09/16 grossly abnormal right rectus muscle near CS scar with multiple implants - Path from soft tissue mass biopsy c/w endometriosis 11/25/16  - Pelvic US 02/01/19 - 4 x 3.2 x 4.3cm hypoechoic lesion along anterior wall of uterus with internal blood flow corresponding to soft tissue mass seen on prior MRI  Objective:   Vitals:   02/27/24 1525  BP: 119/70  Pulse: 70  Weight: 205 lb (93 kg)   General:  Alert, oriented and cooperative. Patient is in no acute distress.  Skin: Skin is warm and dry. No rash noted.   Cardiovascular: Normal heart rate noted  Respiratory: Normal respiratory effort, no problems with respiration noted  Abdomen: Soft, non-tender, non-distended. Palpable abnormality within rectus muscle.    Assessment and Plan:  Baljit Liebert is a 33 y.o. with endometriosis of anterior abdominal wall  Endometriosis of anterior abdominal wall - Discussed options for management. She does not want a birth control method. We reviewed option for aygestin or Dewayne Hatch given her history of HTN. Reviewed r/b of each, specifically with Dewayne Hatch that use is limited to 2 years or less.  Symptoms will likely return with  cessation of either medication - Opts for aygestin - Discussed surgical removal. Would at the very least would need to involve plastic surgery for reconstruction given size. After review of images a pretty large chunk of rectus would need to be removed. Will discuss further if dissatisfied with medical management -     norethindrone (AYGESTIN) 5 MG tablet; Take 1 tablet (5 mg total) by mouth daily.  RTC in 3 months to assess response  Future Appointments  Date Time Provider Department Center  04/11/2024  3:30 PM Birder Robson, MD [facility] None   Lennart Pall, MD

## 2024-02-28 ENCOUNTER — Encounter: Payer: Self-pay | Admitting: Obstetrics and Gynecology

## 2024-03-30 ENCOUNTER — Encounter: Payer: Self-pay | Admitting: Family Medicine

## 2024-03-30 ENCOUNTER — Other Ambulatory Visit (HOSPITAL_COMMUNITY)
Admission: RE | Admit: 2024-03-30 | Discharge: 2024-03-30 | Disposition: A | Discharge status: Home / Self Care | Source: Ambulatory Visit | Attending: Family Medicine | Admitting: Family Medicine

## 2024-03-30 ENCOUNTER — Ambulatory Visit: Admitting: Family Medicine

## 2024-03-30 VITALS — BP 152/89 | HR 68 | Ht 67.0 in | Wt 205.6 lb

## 2024-03-30 DIAGNOSIS — I10 Essential (primary) hypertension: Secondary | ICD-10-CM | POA: Diagnosis not present

## 2024-03-30 DIAGNOSIS — Z7251 High risk heterosexual behavior: Secondary | ICD-10-CM | POA: Diagnosis present

## 2024-03-30 LAB — POCT URINE PREGNANCY: Preg Test, Ur: NEGATIVE

## 2024-03-30 NOTE — Progress Notes (Signed)
    SUBJECTIVE:   CHIEF COMPLAINT / HPI:   Unprotected sex 2 weeks ago [id number]  Having vaginal discharge for 1 week, vaginal itching Discharge consistency: Thick Discharge color: White Medications tried: Boric acid last week, states this improved her symptoms  Denies fever, dysuria, vaginal bleeding, abdominal pain, pelvic pain, back pain, genital ulcers, rashes Last menstrual period 3/10.  She is not on birth control.  Has history of chlamydia, gonorrhea, trichomonas years ago. Does not want birth control, has tried multiple in the past that did not work for her  PERTINENT  PMH / PSH: HTN, folliculitis, hidradenitis, cervical dysplasia, endometriosis  OBJECTIVE:   BP (!) 152/89   Pulse 68   Ht 5\' 7"  (1.702 m)   Wt 205 lb 9.6 oz (93.3 kg)   LMP 02/27/2024   SpO2 100%   BMI 32.20 kg/m   GEN: Well-appearing, no acute distress Pelvic - CMA Dayshia present.VULVA: normal appearing vulva with no masses, tenderness or lesions  VAGINA: normal appearing vagina with normal color, no lesions  CERVIX: normal appearing cervix without lesions, no CMT. Thick white discharge present in vaginal canal.  STD testing performed.   ASSESSMENT/PLAN:   Assessment & Plan Unprotected sexual intercourse Unprotected intercourse 2 weeks ago, patient requesting full STD testing today.  U preg today negative.  Pap not due until August 2025. - Sent swab in for gonorrhea, chlamydia, trichomonas, BV, yeast and blood testing for HIV and RPR - Will treat accordingly Hypertension, unspecified type Has been taking olmesartan-amlodipine-HCTZ 40-10-12.5 mg since March.  Blood pressure is elevated today. - Advised to check blood pressure at home a couple times a week in the mornings and keep blood pressure log - Follow-up in 1 to 2 weeks for blood pressure check and possible medication adjustment   Para March, DO [facility]

## 2024-03-30 NOTE — Patient Instructions (Addendum)
 Good to see you today - Thank you for coming in  Things we discussed today: I tested for BV, yeast, gonorrhea, chlamydia, trichomonas, HIV, RPR today.  We also did a pregnancy test.  I will let you know results as they come in and treat you accordingly. If you begin having any worsening symptoms please call us.  Please check your blood pressure 3-4 times a week in the morning at home and write these down.  Please follow-up with [place] in 1 to 2 weeks to discuss your blood pressure and possibly adjust your medications. Your goal blood pressure is less than 135/85 If regularly higher than this please let me know - either with MyChart or leaving a phone message. Next visit please bring in your blood pressure cuff.     Please always bring your medication bottles

## 2024-03-31 LAB — RPR: RPR Ser Ql: NONREACTIVE

## 2024-03-31 LAB — HIV ANTIBODY (ROUTINE TESTING W REFLEX): HIV Screen 4th Generation wRfx: NONREACTIVE

## 2024-04-03 LAB — CERVICOVAGINAL ANCILLARY ONLY
Bacterial Vaginitis (gardnerella): NEGATIVE
Candida Glabrata: NEGATIVE
Candida Vaginitis: NEGATIVE
Chlamydia: NEGATIVE
Comment: NEGATIVE
Comment: NEGATIVE
Comment: NEGATIVE
Comment: NEGATIVE
Comment: NEGATIVE
Comment: NORMAL
Neisseria Gonorrhea: NEGATIVE
Trichomonas: NEGATIVE

## 2024-04-11 ENCOUNTER — Ambulatory Visit: Payer: BLUE CROSS/BLUE SHIELD | Admitting: Internal Medicine

## 2024-04-11 DIAGNOSIS — J309 Allergic rhinitis, unspecified: Secondary | ICD-10-CM

## 2024-08-08 ENCOUNTER — Other Ambulatory Visit: Payer: Self-pay | Admitting: Family Medicine

## 2024-08-08 DIAGNOSIS — I10 Essential (primary) hypertension: Secondary | ICD-10-CM

## 2024-10-09 ENCOUNTER — Encounter: Payer: Self-pay | Admitting: Internal Medicine

## 2024-10-09 ENCOUNTER — Ambulatory Visit: Admitting: Internal Medicine

## 2024-10-09 ENCOUNTER — Encounter (HOSPITAL_COMMUNITY): Payer: Self-pay | Admitting: Emergency Medicine

## 2024-10-09 ENCOUNTER — Ambulatory Visit (HOSPITAL_COMMUNITY)
Admission: EM | Admit: 2024-10-09 | Discharge: 2024-10-09 | Disposition: A | Discharge status: Home / Self Care | Attending: Internal Medicine | Admitting: Internal Medicine

## 2024-10-09 ENCOUNTER — Other Ambulatory Visit: Payer: Self-pay

## 2024-10-09 VITALS — BP 120/60 | HR 103 | Temp 98.5°F | Ht 67.0 in | Wt 194.0 lb

## 2024-10-09 DIAGNOSIS — J302 Other seasonal allergic rhinitis: Secondary | ICD-10-CM | POA: Diagnosis not present

## 2024-10-09 DIAGNOSIS — J209 Acute bronchitis, unspecified: Secondary | ICD-10-CM

## 2024-10-09 DIAGNOSIS — Z87891 Personal history of nicotine dependence: Secondary | ICD-10-CM

## 2024-10-09 DIAGNOSIS — R0602 Shortness of breath: Secondary | ICD-10-CM | POA: Diagnosis not present

## 2024-10-09 DIAGNOSIS — J3089 Other allergic rhinitis: Secondary | ICD-10-CM

## 2024-10-09 DIAGNOSIS — J069 Acute upper respiratory infection, unspecified: Secondary | ICD-10-CM | POA: Diagnosis not present

## 2024-10-09 DIAGNOSIS — J988 Other specified respiratory disorders: Secondary | ICD-10-CM

## 2024-10-09 LAB — POC SOFIA SARS ANTIGEN FIA: SARS Coronavirus 2 Ag: NEGATIVE

## 2024-10-09 MED ORDER — PROMETHAZINE-DM 6.25-15 MG/5ML PO SYRP: 5.0000 mL | ORAL_SOLUTION | Freq: Every evening | ORAL | 0 refills | Status: AC | PRN

## 2024-10-09 MED ORDER — AZELASTINE HCL 0.1 % NA SOLN: 2.0000 | Freq: Two times a day (BID) | NASAL | 5 refills | Status: AC | PRN

## 2024-10-09 MED ORDER — METHYLPREDNISOLONE SODIUM SUCC 125 MG IJ SOLR
INTRAMUSCULAR | Status: AC
  Filled 2024-10-09: qty 2

## 2024-10-09 MED ORDER — IPRATROPIUM-ALBUTEROL 0.5-2.5 (3) MG/3ML IN SOLN
RESPIRATORY_TRACT | Status: AC
  Filled 2024-10-09: qty 3

## 2024-10-09 MED ORDER — FLUTICASONE PROPIONATE 50 MCG/ACT NA SUSP: 2.0000 | Freq: Every day | NASAL | 5 refills | Status: AC

## 2024-10-09 MED ORDER — IPRATROPIUM-ALBUTEROL 0.5-2.5 (3) MG/3ML IN SOLN
3.0000 mL | Freq: Once | RESPIRATORY_TRACT | Status: AC
  Administered 2024-10-09: 3 mL via RESPIRATORY_TRACT

## 2024-10-09 MED ORDER — METHYLPREDNISOLONE SODIUM SUCC 125 MG IJ SOLR
80.0000 mg | Freq: Once | INTRAMUSCULAR | Status: AC
  Administered 2024-10-09: 80 mg via INTRAMUSCULAR

## 2024-10-09 MED ORDER — ALBUTEROL SULFATE (2.5 MG/3ML) 0.083% IN NEBU: 2.5000 mg | INHALATION_SOLUTION | Freq: Four times a day (QID) | RESPIRATORY_TRACT | 12 refills | Status: AC | PRN

## 2024-10-09 MED ORDER — LEVOCETIRIZINE DIHYDROCHLORIDE 5 MG PO TABS: 5.0000 mg | ORAL_TABLET | Freq: Every evening | ORAL | 5 refills | Status: AC

## 2024-10-09 MED ORDER — ALBUTEROL SULFATE HFA 108 (90 BASE) MCG/ACT IN AERS: 2.0000 | INHALATION_SPRAY | Freq: Four times a day (QID) | RESPIRATORY_TRACT | 0 refills | Status: AC | PRN

## 2024-10-09 MED ORDER — PREDNISONE 20 MG PO TABS: 40.0000 mg | ORAL_TABLET | Freq: Every day | ORAL | 0 refills | Status: AC

## 2024-10-09 NOTE — Patient Instructions (Addendum)
 Allergic Rhinitis: - Positive skin test 01/2024: trees, grasses, weeds, molds, dust mites, cats, dogs, cockroach  - Use nasal saline rinses before nose sprays such as with Neilmed Sinus Rinse.  Use distilled water.   - Use Flonase  2 sprays each nostril daily. Aim upward and outward. - Use Azelastine  2 sprays each nostril twice daily as needed for runny nose, drainage, sneezing, congestion. Aim upward and outward. - Use Xyzal  5 mg daily.  - Consider allergy  shots as long term control of your symptoms by teaching your immune system to be more tolerant of your allergy  triggers  Wheezing with Respiratory Illness - Keep track of symptoms and inhaler use.  - Rescue inhaler: Albuterol  2 puffs every 4-6 hours as needed for respiratory symptoms of cough, shortness of breath, or wheezing

## 2024-10-09 NOTE — Progress Notes (Signed)
   FOLLOW UP Date of Service/Encounter:  10/09/24   Subjective:  Amy Snyder (DOB: [birth year]) is a 33 y.o. female who [facility] for follow up for allergic rhinitis.  History obtained from: chart review and patient. Last seen on 02/09/2024 with me for allergy  testing, started on Flonase , Azelastine  and Xyzal .   Reports having trouble with her allergies.  Lives at her aunt's house that is old and dusty.  Notes frequent congestion, drainage, whistling sound.  Not using any allergy  medications.  Also notes recently having cough, congestion, SOB, wheezing, fatigue last 2 days. No prior hx of asthma or use of inhalers. Noted to have wheezing today in urgent care.  COVID negative.  Given solumedrol + duoneb with improvement and Albuterol  to use PRN.    Past Medical History: Past Medical History:  Diagnosis Date   At risk for sexually transmitted disease due to unprotected sex 02/19/2020   BV (bacterial vaginosis)    Endometriosis    per biopsy done 11/2016   Foot swelling 06/21/2022   History of cesarean section, classical 02/14/2019   Classical c section in 2010 d/t footling breech in active labor   History of preterm delivery 02/14/2019   Hypertension    Normal pelvic exam 02/04/2021   Preoperative clearance 07/20/2022   Sternocleidomastoid muscle tenderness 01/25/2022    Objective:  BP 120/60   Pulse (!) 103   Temp 98.5 F (36.9 C)   Ht 5' 7 (1.702 m)   Wt 194 lb (88 kg)   LMP 10/01/2024 (Approximate)   SpO2 100%   BMI 30.38 kg/m  Body mass index is 30.38 kg/m. Physical Exam: GEN: alert, well developed HEENT: clear conjunctiva, nose with mild inferior turbinate hypertrophy, boggy nasal mucosa, +clear rhinorrhea, + cobblestoning HEART: regular rate and rhythm, no murmur LUNGS: clear to auscultation bilaterally- no wheezing right now, no coughing, unlabored respiration SKIN: no rashes or lesions  Assessment:   1. Seasonal  and perennial allergic rhinitis   2. Wheezing-associated respiratory infection     Plan/Recommendations:  Allergic Rhinitis: - Uncontrolled, restart allergy  medications. - Positive skin test 01/2024: trees, grasses, weeds, molds, dust mites, cats, dogs, cockroach  - Use nasal saline rinses before nose sprays such as with Neilmed Sinus Rinse.  Use distilled water.   - Use Flonase  2 sprays each nostril daily. Aim upward and outward. - Use Azelastine  2 sprays each nostril twice daily as needed for runny nose, drainage, sneezing, congestion. Aim upward and outward. - Use Xyzal  5 mg daily.  - Consider allergy  shots as long term control of your symptoms by teaching your immune system to be more tolerant of your allergy  triggers  Wheezing with Respiratory Illness Viral URI - Keep track of symptoms and inhaler use.  - Rescue inhaler: Albuterol  2 puffs every 4-6 hours as needed for respiratory symptoms of cough, shortness of breath, or wheezing      Return in about 6 months (around 04/09/2025).  Arleta Blanch, MD [facility]

## 2024-10-09 NOTE — Discharge Instructions (Signed)
 You have bronchitis which is inflammation of the upper airways in your lungs due to a virus.   We gave you an injection of steroid in the clinic today to jumpstart treatment of inflammation to the lungs.   Start taking prednisone  pills as prescribed tomorrow with food once daily as prescribed (2 pills - 40mg  - once daily preferably in the morning).   Do not take any NSAIDs with steroid pills (no ibuprofen, naproxen while taking steroid, this could cause stomach upset).   Use albuterol  inhaler 2 puffs every 4-6 hours on a schedule for the next 24 hours while the steroid kicks in, then as needed for cough, shortness of breath, and wheezing.   Use guaifenesin (plain mucinex) to break up congestion in nose/chest so that you are able to excrete easier. Drink plenty of fluids to stay well hydrated while taking mucinex so that it works well in the body.   Promethazine  DM cough syrup may be used at bedtime for coughing, this medicine will cause you to be sleepy so only take at bedtime.   If you develop any new or worsening symptoms or if your symptoms do not start to improve, please return here or follow-up with your primary care provider. If your symptoms are severe, please go to the emergency room.

## 2024-10-09 NOTE — ED Triage Notes (Signed)
 Pt reports Sunday started having cough. Now having congestion and SOB. Took last night some Theraflu and did breathing treatments x 2.

## 2024-10-09 NOTE — ED Provider Notes (Signed)
 [facility]    CSN: [id number] Arrival date & time: 10/09/24  9146      History   Chief Complaint Chief Complaint  Patient presents with   Cough   Nasal Congestion    HPI Amy Snyder is a 33 y.o. female.   Amy Snyder is a 33 y.o. female presenting for chief complaint of cough, nasal congestion, shortness of breath, wheezing, and generalized fatigue that started 2 days ago on October 07, 2024.  Cough is minimally productive and is much worse at nighttime.  Recent exposure to her mother who is sick with similar symptoms.  She denies fever, chills, N/V/D, abdominal pain, and dizziness.  Former smoker, denies current tobacco/marijuana use.  She lives in a home where her aunt smokes inside of the house and she is exposed to secondhand smoke frequently.  Denies history of asthma/frequent bronchitis/COPD. Her son has asthma and she has a nebulizer machine at home.  She was experiencing wheezing so she used the nebulizer machine a couple of times yesterday with some relief of cough/wheezing/shortness of breath. She denies recent antibiotic or steroid use.  Over-the-counter TheraFlu medication with minimal relief of symptoms at home.   Cough   Past Medical History:  Diagnosis Date   At risk for sexually transmitted disease due to unprotected sex 02/19/2020   BV (bacterial vaginosis)    Endometriosis    per biopsy done 11/2016   Foot swelling 06/21/2022   History of cesarean section, classical 02/14/2019   Classical c section in 2010 d/t footling breech in active labor   History of preterm delivery 02/14/2019   Hypertension    Normal pelvic exam 02/04/2021   Preoperative clearance 07/20/2022   Sternocleidomastoid muscle tenderness 01/25/2022    Patient Active Problem List   Diagnosis Date Noted   Follow-up vaginal Pap smear 08/17/2023   Cervical dysplasia 02/26/2022   Hidradenitis 02/16/2022   Endometriosis 12/06/2017   Vaginal discharge 06/01/2016    Folliculitis 11/28/2014   Essential hypertension, benign 07/03/2014   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 Authorizing Provider  albuterol  (PROVENTIL ) (2.5 MG/3ML) 0.083% nebulizer solution Take 3 mLs (2.5 mg total) by nebulization every 6 (six) hours as needed for wheezing or shortness of breath. 10/09/24  Yes Enedelia Dorna HERO, FNP  albuterol  (VENTOLIN  HFA) 108 (90 Base) MCG/ACT inhaler Inhale 2 puffs into the lungs every 6 (six) hours as needed for wheezing or shortness of breath. 10/09/24  Yes Enedelia Dorna HERO, FNP  predniSONE (DELTASONE) 20 MG tablet Take 2 tablets (40 mg total) by mouth daily with breakfast for 5 days. 10/09/24 10/14/24 Yes StanhopeDorna HERO, FNP  promethazine -dextromethorphan (PROMETHAZINE -DM) 6.25-15 MG/5ML syrup Take 5 mLs by mouth at bedtime as needed for cough. 10/09/24  Yes Enedelia Dorna HERO, FNP  azelastine  (ASTELIN ) 0.1 % nasal  spray Place 2 sprays into both nostrils 2 (two) times daily as needed. Use in each nostril as directed 02/09/24   Tobie Arleta SQUIBB, MD  fluticasone  (FLONASE ) 50 MCG/ACT nasal spray Place 2 sprays into both nostrils daily. 02/09/24   Tobie Arleta SQUIBB, MD  levocetirizine (XYZAL ) 5 MG tablet Take 1 tablet (5 mg total) by mouth every evening. 02/09/24   Tobie Arleta SQUIBB, MD  norethindrone  (AYGESTIN ) 5 MG tablet Take 1 tablet (5 mg total) by mouth daily. 02/27/24   Erik Kieth BROCKS, MD  Olmesartan -amLODIPine -HCTZ 40-10-12.5 MG TABS TAKE 1 TABLET BY MOUTH EVERY DAY 08/08/24   Nicholas Bar, MD    Family History Family History  Problem Relation Age of  Onset   Hypertension Mother    Diabetes Other    Diabetes Maternal Grandfather     Social History Social History   Tobacco Use   Smoking status: Former    Current packs/day: 0.00    Average packs/day: 0.2 packs/day for 3.0 years (0.6 ttl pk-yrs)    Types: Cigarettes    Start date: 12/20/2016    Quit date: 12/21/2019    Years since quitting: 4.8    Passive exposure: Past   Smokeless tobacco: Never   Tobacco comments:    smokes socially  Vaping Use   Vaping status: Never Used  Substance Use Topics   Alcohol use: Yes    Alcohol/week: 2.0 standard drinks of alcohol    Types: 2 Shots of liquor per week    Comment: socially   Drug use: No     Allergies   Dust mite extract and Flagyl  [metronidazole ]   Review of Systems Review of Systems  Respiratory:  Positive for cough.   Per HPI   Physical Exam Triage Vital Signs ED Triage Vitals  Encounter Vitals Group     BP 10/09/24 0909 139/87     Girls Systolic BP Percentile --      Girls Diastolic BP Percentile --      Boys Systolic BP Percentile --      Boys Diastolic BP Percentile --      Pulse Rate 10/09/24 0909 86     Resp 10/09/24 0909 19     Temp 10/09/24 0909 98.4 F (36.9 C)     Temp Source 10/09/24 0909 Oral     SpO2 10/09/24 0909 94 %     Weight --      Height --      Head Circumference --      Peak Flow --      Pain Score 10/09/24 0908 0     Pain Loc --      Pain Education --      Exclude from Growth Chart --    No data found.  Updated Vital Signs BP 139/87 (BP Location: Right Arm)   Pulse 86   Temp 98.4 F (36.9 C) (Oral)   Resp 19   LMP 10/01/2024 (Approximate)   SpO2 94%   Visual Acuity Right Eye Distance:   Left Eye Distance:   Bilateral Distance:    Right Eye Near:   Left Eye Near:    Bilateral Near:     Physical Exam Vitals and nursing note reviewed.  Constitutional:      Appearance: She is not ill-appearing or toxic-appearing.  HENT:     Head: Normocephalic and atraumatic.      Right Ear: Hearing, tympanic membrane, ear canal and external ear normal.     Left Ear: Hearing,  tympanic membrane, ear canal and external ear normal.     Nose: Congestion present.     Mouth/Throat:     Lips: Pink.     Mouth: Mucous membranes are moist. No injury or oral lesions.     Dentition: Normal dentition.     Tongue: No lesions.     Pharynx: Oropharynx is clear. Uvula midline. No pharyngeal swelling, oropharyngeal exudate, posterior oropharyngeal erythema, uvula swelling or postnasal drip.     Tonsils: No tonsillar exudate.  Eyes:     General: Lids are normal. Vision grossly intact. Gaze aligned appropriately.     Extraocular Movements: Extraocular movements intact.     Conjunctiva/sclera: Conjunctivae normal.  Neck:     Trachea: Trachea and phonation normal.  Cardiovascular:     Rate and Rhythm: Normal rate and regular rhythm.     Heart sounds: Normal heart sounds, S1 normal and S2 normal.  Pulmonary:     Effort: Pulmonary effort is normal. No respiratory distress.     Breath sounds: Normal air entry. Wheezing present. No rhonchi or rales.     Comments: Diffuse expiratory wheezing to all lung fields throughout.  Speaking in full sentences without difficulty.  Chest:     Chest wall: No tenderness.  Musculoskeletal:     Cervical back: Neck supple.  Lymphadenopathy:     Cervical: No cervical adenopathy.  Skin:    General: Skin is warm and dry.     Capillary Refill: Capillary refill takes less than 2 seconds.     Findings: No rash.  Neurological:     General: No focal deficit present.     Mental Status: She is alert and oriented to person, place, and time. Mental status is at baseline.     Cranial Nerves: No dysarthria or facial asymmetry.  Psychiatric:        Mood and Affect: Mood normal.        Speech: Speech normal.        Behavior: Behavior normal.        Thought Content: Thought content normal.        Judgment: Judgment normal.      UC Treatments / Results   Labs (all labs ordered are listed, but only abnormal results are displayed) Labs Reviewed  POC SOFIA SARS ANTIGEN FIA    EKG   Radiology No results found.  Procedures Procedures (including critical care time)  Medications Ordered in UC Medications  methylPREDNISolone  sodium succinate (SOLU-MEDROL ) 125 mg/2 mL injection 80 mg (80 mg Intramuscular Given 10/09/24 1010)  ipratropium-albuterol  (DUONEB) 0.5-2.5 (3) MG/3ML nebulizer solution 3 mL (3 mLs Nebulization Given 10/09/24 1010)    Initial Impression / Assessment and Plan / UC Course  I have reviewed the triage vital signs and the nursing notes.  Pertinent labs & imaging results that were available during my care of the patient were reviewed by me and considered in my medical decision making (see chart for details).   1.  Acute bronchitis, viral URI with cough, shortness of breath, former smoker Presentation consistent with acute viral bronchitis.  DuoNeb nebulizer treatment given with significant symptomatic improvement reported by patient and lung sounds on reassessment.   Viral testing: Point-of-care COVID-19 testing is negative.  Treatment plan:  Solu-Medrol  80 mg IM steroid given today, prednisone burst to be started tomorrow.   Will treat with prednisone burst, PRN use of albuterol , mucinex, and antitussive  at bedtime.    Counseled patient on potential for adverse effects with medications prescribed/recommended today,  strict ER and return-to-clinic precautions discussed, patient verbalized understanding.    Final Clinical Impressions(s) / UC Diagnoses   Final diagnoses:  Viral URI with cough  Acute bronchitis, unspecified organism  Shortness of breath  Former smoker     Discharge Instructions      You have bronchitis which is inflammation of the upper airways in your lungs due to a virus.   We gave you an injection of steroid in the clinic today to jumpstart treatment of inflammation to the lungs.    Start taking prednisone pills as prescribed tomorrow with food once daily as prescribed (2 pills - 40mg  - once daily preferably in the morning).   Do not take any NSAIDs with steroid pills (no ibuprofen , naproxen  while taking steroid, this could cause stomach upset).   Use albuterol  inhaler 2 puffs every 4-6 hours on a schedule for the next 24 hours while the steroid kicks in, then as needed for cough, shortness of breath, and wheezing.   Use guaifenesin (plain mucinex) to break up congestion in nose/chest so that you are able to excrete easier. Drink plenty of fluids to stay well hydrated while taking mucinex so that it works well in the body.   Promethazine  DM cough syrup may be used at bedtime for coughing, this medicine will cause you to be sleepy so only take at bedtime.   If you develop any new or worsening symptoms or if your symptoms do not start to improve, please return here or follow-up with your primary care provider. If your symptoms are severe, please go to the emergency room.      ED Prescriptions     Medication Sig Dispense Auth. Provider   predniSONE (DELTASONE) 20 MG tablet Take 2 tablets (40 mg total) by mouth daily with breakfast for 5 days. 10 tablet Enedelia Dorna HERO, FNP   albuterol  (VENTOLIN  HFA) 108 (90 Base) MCG/ACT inhaler Inhale 2 puffs into the lungs every 6 (six) hours as needed for wheezing or shortness of breath. 18 g Enedelia Dorna M, FNP   albuterol  (PROVENTIL ) (2.5 MG/3ML) 0.083% nebulizer solution Take 3 mLs (2.5 mg total) by nebulization every 6 (six) hours as needed for wheezing or shortness of breath. 75 mL Enedelia Dorna HERO, FNP   promethazine -dextromethorphan (PROMETHAZINE -DM) 6.25-15 MG/5ML syrup Take 5 mLs by mouth at bedtime as needed for cough. 118 mL Enedelia Dorna HERO, FNP      PDMP not reviewed this encounter.   Enedelia Dorna HERO, [place] 10/09/24 1032

## 2024-10-15 ENCOUNTER — Encounter: Payer: Self-pay | Admitting: Obstetrics

## 2024-10-15 ENCOUNTER — Other Ambulatory Visit (HOSPITAL_COMMUNITY)
Admission: RE | Admit: 2024-10-15 | Discharge: 2024-10-15 | Disposition: A | Discharge status: Home / Self Care | Source: Ambulatory Visit | Attending: Obstetrics | Admitting: Obstetrics

## 2024-10-15 ENCOUNTER — Ambulatory Visit: Admitting: Obstetrics

## 2024-10-15 VITALS — BP 124/73 | HR 71 | Ht 67.0 in | Wt 191.4 lb

## 2024-10-15 DIAGNOSIS — N80C19 Endometriosis of the anterior abdominal wall, unspecified depth: Secondary | ICD-10-CM

## 2024-10-15 DIAGNOSIS — N898 Other specified noninflammatory disorders of vagina: Secondary | ICD-10-CM | POA: Diagnosis not present

## 2024-10-15 DIAGNOSIS — Z01419 Encounter for gynecological examination (general) (routine) without abnormal findings: Secondary | ICD-10-CM | POA: Diagnosis present

## 2024-10-15 NOTE — Progress Notes (Unsigned)
 Subjective:        Amy Snyder is a 33 y.o. female here for a routine exam.  Current complaints: ***.    Personal health questionnaire:  Is patient [name], have a family history of breast and/or ovarian cancer: {YES NO:22349} Is there a family history of uterine cancer diagnosed at age < 24, gastrointestinal cancer, urinary tract cancer, family member who is a Personnel Officer syndrome-associated carrier: {YES NO:22349} Is the patient overweight and hypertensive, family history of diabetes, personal history of gestational diabetes, preeclampsia or PCOS: {YES NO:22349} Is patient over 26, have PCOS,  family history of premature CHD under age 37, diabetes, smoke, have hypertension or peripheral artery disease:  {YES NO:22349} At any time, has a partner hit, kicked or otherwise hurt or frightened you?: {YES NO:22349} Over the past 2 weeks, have you felt down, depressed or hopeless?: {YES NO:22349} Over the past 2 weeks, have you felt little interest or pleasure in doing things?:{M;yes/no/sometimes:15259}   Gynecologic History Patient's last menstrual period was 10/01/2024 (exact date). Contraception: {method:5051} Last Pap: ***. Results were: {norm/abn:16337} Last mammogram: ***. Results were: {norm/abn:16337}  Obstetric History OB History  Gravida Para Term Preterm AB Living  2 1  1 1 1   SAB IAB Ectopic Multiple Live Births  1    1    # Outcome Date GA Lbr Len/2nd Weight Sex Type Anes PTL Lv  2 SAB 03/26/19 [redacted]w[redacted]d         1 Preterm 10/21/09 [redacted]w[redacted]d  2 lb 4 oz (1.021 kg) M CS-Unspec EPI  LIV     Birth Comments: [facility]    Past Medical History:  Diagnosis Date   At risk for sexually transmitted disease due to unprotected sex 02/19/2020   BV (bacterial vaginosis)    Endometriosis    per biopsy done 11/2016   Foot swelling 06/21/2022   History of cesarean section, classical 02/14/2019   Classical c section in 2010 d/t footling breech in active labor   History of preterm delivery  02/14/2019   Hypertension    Normal pelvic exam 02/04/2021   Preoperative clearance 07/20/2022   Sternocleidomastoid muscle tenderness 01/25/2022    Past Surgical History:  Procedure Laterality Date   CESAREAN SECTION  10/21/2009   Classical c/s      Current Outpatient Medications:    albuterol  (PROVENTIL ) (2.5 MG/3ML) 0.083% nebulizer solution, Take 3 mLs (2.5 mg total) by nebulization every 6 (six) hours as needed for wheezing or shortness of breath., Disp: 75 mL, Rfl: 12   albuterol  (VENTOLIN  HFA) 108 (90 Base) MCG/ACT inhaler, Inhale 2 puffs into the lungs every 6 (six) hours as needed for wheezing or shortness of breath., Disp: 18 g, Rfl: 0   azelastine  (ASTELIN ) 0.1 % nasal spray, Place 2 sprays into both nostrils 2 (two) times daily as needed. Use in each nostril as directed, Disp: 30 mL, Rfl: 5   fluticasone  (FLONASE ) 50 MCG/ACT nasal spray, Place 2 sprays into both nostrils daily., Disp: 16 g, Rfl: 5   levocetirizine (XYZAL ) 5 MG tablet, Take 1 tablet (5 mg total) by mouth every evening., Disp: 30 tablet, Rfl: 5   norethindrone  (AYGESTIN ) 5 MG tablet, Take 1 tablet (5 mg total) by mouth daily., Disp: 30 tablet, Rfl: 2   Olmesartan -amLODIPine -HCTZ 40-10-12.5 MG TABS, TAKE 1 TABLET BY MOUTH EVERY DAY, Disp: 90 tablet, Rfl: 1   promethazine -dextromethorphan (PROMETHAZINE -DM) 6.25-15 MG/5ML syrup, Take 5 mLs by mouth at bedtime as needed for cough., Disp: 118 mL, Rfl: 0 Allergies  Allergen Reactions   Dust Mite Extract Itching   Flagyl  [Metronidazole ] Itching, Swelling and Rash    Pt reports she is able to use Metronidazole  gel without problem or reaction    Social History   Tobacco Use   Smoking status: Former    Current packs/day: 0.00    Average packs/day: 0.2 packs/day for 3.0 years (0.6 ttl pk-yrs)    Types: Cigarettes    Start date: 12/20/2016    Quit date: 12/21/2019    Years since quitting: 4.8    Passive exposure: Past   Smokeless tobacco: Never   Tobacco comments:     smokes socially  Substance Use Topics   Alcohol use: Yes    Alcohol/week: 2.0 standard drinks of alcohol    Types: 2 Shots of liquor per week    Comment: socially    Family History  Problem Relation Age of Onset   Hypertension Mother    Diabetes Other    Diabetes Maternal Grandfather       Review of Systems  Constitutional: negative for fatigue and weight loss Respiratory: negative for cough and wheezing Cardiovascular: negative for chest pain, fatigue and palpitations Gastrointestinal: negative for abdominal pain and change in bowel habits Musculoskeletal:negative for myalgias Neurological: negative for gait problems and tremors Behavioral/Psych: negative for abusive relationship, depression Endocrine: negative for temperature intolerance    Genitourinary:negative for abnormal menstrual periods, genital lesions, hot flashes, sexual problems and vaginal discharge Integument/breast: negative for breast lump, breast tenderness, nipple discharge and skin lesion(s)    Objective:       BP 124/73   Pulse 71   Ht 5' 7 (1.702 m)   Wt 191 lb 6.4 oz (86.8 kg)   LMP 10/01/2024 (Exact Date)   BMI 29.98 kg/m  General:   alert  Skin:   no rash or abnormalities  Lungs:   clear to auscultation bilaterally  Heart:   regular rate and rhythm, S1, S2 normal, no murmur, click, rub or gallop  Breasts:   normal without suspicious masses, skin or nipple changes or axillary nodes  Abdomen:  normal findings: no organomegaly, soft, non-tender and no hernia  Pelvis:  External genitalia: normal general appearance Urinary system: urethral meatus normal and bladder without fullness, nontender Vaginal: normal without tenderness, induration or masses Cervix: normal appearance Adnexa: normal bimanual exam Uterus: anteverted and non-tender, normal size   Lab Review Urine pregnancy test Labs reviewed {YES NO:22349} Radiologic studies reviewed {YES NO:22349}  ***% of *** min visit spent on  counseling and coordination of care.   Assessment:    Healthy female exam.    Plan:    {plan:19193}   No orders of the defined types were placed in this encounter.  Orders Placed This Encounter  Procedures   MR ABDOMEN WWO CONTRAST    Standing Status:   Future    Expiration Date:   10/15/2025    If indicated for the ordered procedure, I authorize the administration of contrast media per Radiology protocol:   Yes    What is the patient's sedation requirement?:   No Sedation    Does the patient have a pacemaker or implanted devices?:   No    Preferred imaging location?:   [facility] (table limit-550lbs)   MR PELVIS W WO CONTRAST    Standing Status:   Future    Expiration Date:   10/15/2025    If indicated for the ordered procedure, I authorize the administration of contrast media per Radiology protocol:  Yes    What is the patient's sedation requirement?:   No Sedation    Does the patient have a pacemaker or implanted devices?:   No    Preferred imaging location?:   [facility] (table limit-550lbs)   Need to obtain previous records Possible management options include:*** Follow up as needed. ***    CARLIN RONAL CENTERS, MD, FACOG Attending Obstetrician & Gynecologist, [facility] for [place], [facility], [place] 10/15/2024

## 2024-10-15 NOTE — Progress Notes (Unsigned)
 Pt presents for endometriosis consult.

## 2024-10-17 ENCOUNTER — Ambulatory Visit: Payer: Self-pay | Admitting: Obstetrics

## 2024-10-17 DIAGNOSIS — B3731 Acute candidiasis of vulva and vagina: Secondary | ICD-10-CM

## 2024-10-17 LAB — CERVICOVAGINAL ANCILLARY ONLY
Bacterial Vaginitis (gardnerella): NEGATIVE
Candida Glabrata: NEGATIVE
Candida Vaginitis: POSITIVE — AB
Chlamydia: NEGATIVE
Comment: NEGATIVE
Comment: NEGATIVE
Comment: NEGATIVE
Comment: NEGATIVE
Comment: NEGATIVE
Comment: NORMAL
Neisseria Gonorrhea: NEGATIVE
Trichomonas: NEGATIVE

## 2024-10-17 MED ORDER — FLUCONAZOLE 150 MG PO TABS: 150.0000 mg | ORAL_TABLET | Freq: Once | ORAL | 0 refills | Status: AC

## 2024-10-22 LAB — CYTOLOGY - PAP
Comment: NEGATIVE
Diagnosis: NEGATIVE
Diagnosis: REACTIVE
High risk HPV: NEGATIVE

## 2024-11-12 NOTE — Progress Notes (Deleted)
    GYNECOLOGY VISIT  Patient name: Amy Snyder MRN [id number]  Date of birth: 12-08-1991 Chief Complaint:   No chief complaint on file.   History:  Discussed the use of AI scribe software for clinical note transcription with the patient, who gave verbal consent to proceed.  History of Present Illness   Chart review: visit w/ [facility] 10/27 w/ reported worsened endometriosis. 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 Referred to gen surg previously but didn't follow up 2017 IR guided biopsy of mass in recuts muscle   The following portions of the patient's history were reviewed and updated as appropriate: allergies, current medications, past family history, past medical history, past social history, past surgical history and problem list.   Health Maintenance:   Last pap     Component Value Date/Time   DIAGPAP  10/15/2024 1726    - Negative for Intraepithelial Lesions or Malignancy (NILM)   DIAGPAP - Benign reactive/reparative changes 10/15/2024 1726   DIAGPAP  08/17/2023 0901    - Negative for Intraepithelial Lesions or Malignancy (NILM)   DIAGPAP - Benign reactive/reparative changes 08/17/2023 0901   HPVHIGH Negative 10/15/2024 1726   HPVHIGH Positive (A) 08/17/2023 0901   HPVHIGH Positive (A) 03/28/2023 1621   ADEQPAP  10/15/2024 1726    Satisfactory for evaluation; transformation zone component PRESENT.   ADEQPAP  08/17/2023 0901    Satisfactory for evaluation; transformation zone component PRESENT.   ADEQPAP  03/28/2023 1621    Satisfactory for evaluation; transformation zone component PRESENT.    Health Maintenance  Topic Date Due   DTaP/Tdap/Td vaccine (1 - Tdap) Never done   Hepatitis B Vaccine (1 of 3 - 19+ 3-dose series) Never done   HPV Vaccine (1 - 3-dose SCDM series) Never done   Flu Shot  07/20/2024   COVID-19 Vaccine (2 -  2025-26 season) 08/20/2024   Pap with HPV screening  10/15/2029   Hepatitis C Screening  Completed   HIV Screening  Completed   Pneumococcal Vaccine  Aged Out   Meningitis B Vaccine  Aged Out      Review of Systems:  {Ros - complete:30496} Comprehensive review of systems was otherwise negative.   Objective:  Physical Exam LMP 10/01/2024 (Exact Date)    Physical Exam   Labs and Imaging No results found.     Assessment & Plan:  Assessment and Plan Assessment & Plan        *** Routine preventative health maintenance measures emphasized.  Carter Quarry, MD [facility] for [facility], [facility]

## 2024-11-14 ENCOUNTER — Ambulatory Visit: Admitting: Obstetrics and Gynecology

## 2024-11-17 ENCOUNTER — Other Ambulatory Visit

## 2024-11-30 ENCOUNTER — Ambulatory Visit: Admitting: Obstetrics and Gynecology
